# Patient Record
Sex: Male | Born: 1955 | Race: White | Hispanic: No | Smoking: Former smoker
Health system: Southern US, Community
[De-identification: ages and names within clinical notes are randomized; demographics above are authoritative.]

## PROBLEM LIST (undated history)

## (undated) DIAGNOSIS — I2510 Atherosclerotic heart disease of native coronary artery without angina pectoris: Secondary | ICD-10-CM

## (undated) DIAGNOSIS — J449 Chronic obstructive pulmonary disease, unspecified: Secondary | ICD-10-CM

## (undated) DIAGNOSIS — E559 Vitamin D deficiency, unspecified: Secondary | ICD-10-CM

## (undated) DIAGNOSIS — M1990 Unspecified osteoarthritis, unspecified site: Secondary | ICD-10-CM

## (undated) DIAGNOSIS — M109 Gout, unspecified: Secondary | ICD-10-CM

## (undated) DIAGNOSIS — D649 Anemia, unspecified: Secondary | ICD-10-CM

## (undated) DIAGNOSIS — I10 Essential (primary) hypertension: Secondary | ICD-10-CM

## (undated) DIAGNOSIS — G4733 Obstructive sleep apnea (adult) (pediatric): Secondary | ICD-10-CM

## (undated) DIAGNOSIS — G4700 Insomnia, unspecified: Secondary | ICD-10-CM

## (undated) DIAGNOSIS — R0600 Dyspnea, unspecified: Secondary | ICD-10-CM

## (undated) DIAGNOSIS — Z950 Presence of cardiac pacemaker: Secondary | ICD-10-CM

## (undated) DIAGNOSIS — E119 Type 2 diabetes mellitus without complications: Secondary | ICD-10-CM

## (undated) DIAGNOSIS — E785 Hyperlipidemia, unspecified: Secondary | ICD-10-CM

## (undated) DIAGNOSIS — I6529 Occlusion and stenosis of unspecified carotid artery: Secondary | ICD-10-CM

## (undated) HISTORY — DX: Type 2 diabetes mellitus without complications: E11.9

## (undated) HISTORY — PX: PACEMAKER INSERTION: SHX728

## (undated) HISTORY — DX: Insomnia, unspecified: G47.00

## (undated) HISTORY — DX: Chronic obstructive pulmonary disease, unspecified: J44.9

## (undated) HISTORY — DX: Obstructive sleep apnea (adult) (pediatric): G47.33

## (undated) HISTORY — DX: Hyperlipidemia, unspecified: E78.5

## (undated) HISTORY — DX: Atherosclerotic heart disease of native coronary artery without angina pectoris: I25.10

## (undated) HISTORY — DX: Anemia, unspecified: D64.9

## (undated) HISTORY — DX: Vitamin D deficiency, unspecified: E55.9

## (undated) HISTORY — DX: Occlusion and stenosis of unspecified carotid artery: I65.29

---

## 1989-02-26 HISTORY — PX: BACK SURGERY: SHX140

## 1992-02-27 HISTORY — PX: CATARACT EXTRACTION: SUR2

## 2011-02-27 HISTORY — PX: CARDIAC CATHETERIZATION: SHX172

## 2011-02-27 HISTORY — PX: CORONARY ARTERY BYPASS GRAFT: SHX141

## 2015-09-22 ENCOUNTER — Encounter: Payer: Medicare HMO | Admitting: Vascular Surgery

## 2015-09-22 ENCOUNTER — Encounter (HOSPITAL_COMMUNITY): Payer: Medicare HMO

## 2015-10-04 ENCOUNTER — Other Ambulatory Visit: Payer: Self-pay | Admitting: Vascular Surgery

## 2015-10-04 DIAGNOSIS — I6521 Occlusion and stenosis of right carotid artery: Secondary | ICD-10-CM

## 2015-10-25 ENCOUNTER — Encounter: Payer: Self-pay | Admitting: Vascular Surgery

## 2015-11-04 ENCOUNTER — Ambulatory Visit: Payer: Medicare HMO | Admitting: *Deleted

## 2015-11-04 ENCOUNTER — Ambulatory Visit (INDEPENDENT_AMBULATORY_CARE_PROVIDER_SITE_OTHER): Payer: Medicare HMO | Admitting: Vascular Surgery

## 2015-11-04 ENCOUNTER — Ambulatory Visit (HOSPITAL_COMMUNITY)
Admission: RE | Admit: 2015-11-04 | Discharge: 2015-11-04 | Disposition: A | Discharge status: Home / Self Care | Payer: Medicare HMO | Source: Ambulatory Visit | Attending: Vascular Surgery | Admitting: Vascular Surgery

## 2015-11-04 ENCOUNTER — Encounter: Payer: Self-pay | Admitting: Vascular Surgery

## 2015-11-04 VITALS — BP 103/66 | HR 73 | Temp 98.0°F

## 2015-11-04 DIAGNOSIS — I739 Peripheral vascular disease, unspecified: Secondary | ICD-10-CM

## 2015-11-04 DIAGNOSIS — I6523 Occlusion and stenosis of bilateral carotid arteries: Secondary | ICD-10-CM

## 2015-11-04 DIAGNOSIS — I6521 Occlusion and stenosis of right carotid artery: Secondary | ICD-10-CM | POA: Insufficient documentation

## 2015-11-04 LAB — VAS US CAROTID
LCCADSYS: -89 cm/s
LCCAPDIAS: 22 cm/s
LEFT ECA DIAS: -10 cm/s
LICADDIAS: -35 cm/s
LICADSYS: -120 cm/s
LICAPDIAS: -46 cm/s
LICAPSYS: -128 cm/s
Left CCA dist dias: -22 cm/s
Left CCA prox sys: 101 cm/s
RCCAPDIAS: 20 cm/s
RIGHT CCA MID DIAS: 16 cm/s
RIGHT ECA DIAS: -12 cm/s
Right CCA prox sys: 99 cm/s
Right cca dist sys: -40 cm/s

## 2015-11-04 NOTE — Progress Notes (Signed)
Patient ID: NOE GLOD, male   DOB: 1955-03-24, 60 y.o.   MRN: [id number]  Reason for Consult: New Evaluation (bilateral carotid stenosis)   Referred by Secundino Ginger, PA-C  Subjective:     HPI:  Dean Ellison is a 60 y.o. male with history coronary artery disease status post CABG. He is a current every day smoker and was recently evaluated at [facility] for carotid artery disease where he was found to have greater than 80% disease. He denies any history of stroke TIA or amaurosis. He does have some pain in his hips and lower extremities with walking which appeared to be both musculoskeletal and possibly vascular. He also has erectile dysfunction as well as a diagnosis of CHF. He does take aspirin and statin regularly states that he can walk up to half a mile per day.  Past Medical History:  Diagnosis Date  . Anemia   . CAD (coronary artery disease)   . Carotid artery occlusion   . COPD (chronic obstructive pulmonary disease) ([place])   . Diabetes mellitus without complication ([place])   . Hyperlipidemia   . OSA (obstructive sleep apnea)   . Persistent disorder of initiating or maintaining sleep   . Vitamin D deficiency    Family History  Problem Relation Age of Onset  . Heart disease Mother   . Diabetes Mother   . Hypertension Mother   . Arthritis Mother   . Heart disease Father   . Diabetes Father   . Hypertension Father   . Arthritis Father    Past Surgical History:  Procedure Laterality Date  . BACK SURGERY  1991  . CARDIAC CATHETERIZATION Right 2013  . CATARACT EXTRACTION Bilateral 1994  . CORONARY ARTERY BYPASS GRAFT  2013  . PACEMAKER INSERTION      Short Social History:  Social History  Substance Use Topics  . Smoking status: Current Every Day Smoker    Years: 40.00    Types: Cigarettes  . Smokeless tobacco: Never Used  . Alcohol use 0.6 oz/week    1 Cans of beer per week     Comment: 1 every 6 months or more    Allergies  Allergen  Reactions  . Meperidine Other (See Comments)    crazy Makes me crazy crazy Pt stated "it made me crazy"    Current Outpatient Prescriptions  Medication Sig Dispense Refill  . aspirin buffered ([employer] BUFFERED ASPIRIN) 325 MG TABS tablet Take 325 mg by mouth.    Marland Kitchen atenolol (TENORMIN) 50 MG tablet Take 50 mg by mouth.    Marland Kitchen atenolol (TENORMIN) 50 MG tablet Take by mouth.    . Dulaglutide 1.5 MG/0.5ML SOPN Inject 1.5 mg into the skin.    . famotidine (PEPCID) 20 MG tablet Take 20 mg by mouth.    . furosemide (LASIX) 20 MG tablet Take 20 mg by mouth.    Marland Kitchen lisinopril (PRINIVIL,ZESTRIL) 20 MG tablet Take 20 mg by mouth.    . metFORMIN (GLUCOPHAGE) 1000 MG tablet Take 1,000 mg by mouth.    . rosuvastatin (CRESTOR) 20 MG tablet Take 20 mg by mouth.    . tiotropium (SPIRIVA) 18 MCG inhalation capsule Place into inhaler and inhale.     No current facility-administered medications for this visit.     Review of Systems  Constitutional:  Constitutional negative. HENT: HENT negative.  Eyes: Eyes negative.  Respiratory: Positive for shortness of breath.  GI: Gastrointestinal negative.  GU:  Erectile dysfunction Musculoskeletal: Positive for leg pain and joint pain.  Skin: Negative for wound.  Neurological: Neurological negative. Hematologic: Hematologic/lymphatic negative.  Psychiatric: Psychiatric negative.        Objective:  Objective   Vitals:   11/04/15 1158 11/04/15 1201  BP: 119/71 103/66  Pulse: 73 73  Temp: 98 F (36.7 C)    There is no height or weight on file to calculate BMI.  Physical Exam  Constitutional: He is oriented to person, place, and time. He appears well-developed.  HENT:  Head: Normocephalic.  Eyes: EOM are normal.  Neck: Normal range of motion.  Cardiovascular: Normal rate and normal heart sounds.   Pulses:      Carotid pulses are 2+ on the right side, and 2+ on the left side.      Radial pulses are 2+ on the right side, and 2+ on the left side.         Femoral pulses are 2+ on the right side, and 2+ on the left side. Multiphasic R peroneal/pt, no dp obtained L pt/at multiphasic  Pulmonary/Chest: Effort normal.  Abdominal: Soft. He exhibits no mass.  Musculoskeletal: Normal range of motion. He exhibits no edema.  Lymphadenopathy:    He has no cervical adenopathy.  Neurological: He is alert and oriented to person, place, and time. No cranial nerve deficit.  Skin: Skin is warm and dry.    Data: Carotid duplex today demonstrating right-sided 60-79% proximal ICA stenosis with PSV 273 and EDV 100.     Assessment/Plan:   this is a 60 year old white male with history of CAD sent here for carotid artery stenosis which is 60-79% by our study today. He is on aspirin and statin which he takes religiously. He also has some limitation to his walking which appears multifactorial. He is to continue his medicines and I will follow him up in 1 year with repeat carotid duplex as well as ABIs. I have counseled him on smoking cessation and his likelihood of progressing to need carotid intervention should he did not stop smoking. I have also counseled him on the warning signs of stroke for which she should seek medical attention and he demonstrates good understanding.    Waynetta Sandy MD [facility]

## 2015-12-01 ENCOUNTER — Ambulatory Visit: Payer: Medicare HMO | Admitting: Skilled Nursing Facility1

## 2016-01-31 NOTE — Addendum Note (Signed)
Addended by: Lianne Cure A on: 01/31/2016 10:02 AM   Modules accepted: Orders

## 2016-11-09 ENCOUNTER — Ambulatory Visit: Payer: Medicare HMO | Admitting: Vascular Surgery

## 2016-11-09 ENCOUNTER — Encounter (HOSPITAL_COMMUNITY): Payer: Medicare HMO

## 2017-01-04 ENCOUNTER — Encounter (HOSPITAL_COMMUNITY): Payer: Medicare HMO

## 2017-01-04 ENCOUNTER — Ambulatory Visit: Payer: Medicare HMO | Admitting: Vascular Surgery

## 2017-02-15 ENCOUNTER — Ambulatory Visit: Payer: Medicare HMO | Admitting: Vascular Surgery

## 2017-02-15 ENCOUNTER — Encounter (HOSPITAL_COMMUNITY): Payer: Medicare HMO

## 2017-04-05 ENCOUNTER — Encounter (HOSPITAL_COMMUNITY): Payer: Medicare HMO

## 2017-04-05 ENCOUNTER — Ambulatory Visit: Payer: Medicare HMO | Admitting: Vascular Surgery

## 2017-05-31 ENCOUNTER — Encounter: Payer: Self-pay | Admitting: Vascular Surgery

## 2017-05-31 ENCOUNTER — Other Ambulatory Visit: Payer: Self-pay

## 2017-05-31 ENCOUNTER — Ambulatory Visit (HOSPITAL_COMMUNITY)
Admission: RE | Admit: 2017-05-31 | Discharge: 2017-05-31 | Disposition: A | Discharge status: Home / Self Care | Payer: Medicare HMO | Source: Ambulatory Visit | Attending: Vascular Surgery | Admitting: Vascular Surgery

## 2017-05-31 ENCOUNTER — Ambulatory Visit (INDEPENDENT_AMBULATORY_CARE_PROVIDER_SITE_OTHER)
Admission: RE | Admit: 2017-05-31 | Discharge: 2017-05-31 | Disposition: A | Discharge status: Home / Self Care | Payer: Medicare HMO | Source: Ambulatory Visit | Attending: Vascular Surgery | Admitting: Vascular Surgery

## 2017-05-31 ENCOUNTER — Ambulatory Visit (INDEPENDENT_AMBULATORY_CARE_PROVIDER_SITE_OTHER): Payer: Medicare HMO | Admitting: Vascular Surgery

## 2017-05-31 VITALS — BP 127/71 | HR 80 | Resp 20 | Ht <= 58 in | Wt 182.0 lb

## 2017-05-31 DIAGNOSIS — E785 Hyperlipidemia, unspecified: Secondary | ICD-10-CM | POA: Insufficient documentation

## 2017-05-31 DIAGNOSIS — I10 Essential (primary) hypertension: Secondary | ICD-10-CM | POA: Insufficient documentation

## 2017-05-31 DIAGNOSIS — E1151 Type 2 diabetes mellitus with diabetic peripheral angiopathy without gangrene: Secondary | ICD-10-CM | POA: Diagnosis not present

## 2017-05-31 DIAGNOSIS — I739 Peripheral vascular disease, unspecified: Secondary | ICD-10-CM

## 2017-05-31 DIAGNOSIS — I6523 Occlusion and stenosis of bilateral carotid arteries: Secondary | ICD-10-CM

## 2017-05-31 DIAGNOSIS — Z01812 Encounter for preprocedural laboratory examination: Secondary | ICD-10-CM

## 2017-05-31 DIAGNOSIS — F17200 Nicotine dependence, unspecified, uncomplicated: Secondary | ICD-10-CM | POA: Diagnosis not present

## 2017-05-31 NOTE — Progress Notes (Signed)
Patient ID: Dean Ellison, male   DOB: Feb 12, 1956, 62 y.o.   MRN: [id number]  Reason for Consult: Follow-up (1 year f/u carotid, abi )   Referred by Secundino Ginger, PA-C  Subjective:     HPI:  Dean Ellison is a 62 y.o. male whom I first saw last year for carotid artery disease that was thought to be greater than 80% on the right.  He does not have any history of stroke TIA or amaurosis.  He now follows up with further carotid duplex.  He is taking his aspirin and statin regularly and continues to walk with out limitation.  Past Medical History:  Diagnosis Date  . Anemia   . CAD (coronary artery disease)   . Carotid artery occlusion   . COPD (chronic obstructive pulmonary disease) ([place])   . Diabetes mellitus without complication ([place])   . Hyperlipidemia   . OSA (obstructive sleep apnea)   . Persistent disorder of initiating or maintaining sleep   . Vitamin D deficiency    Family History  Problem Relation Age of Onset  . Heart disease Mother   . Diabetes Mother   . Hypertension Mother   . Arthritis Mother   . Heart disease Father   . Diabetes Father   . Hypertension Father   . Arthritis Father    Past Surgical History:  Procedure Laterality Date  . BACK SURGERY  1991  . CARDIAC CATHETERIZATION Right 2013  . CATARACT EXTRACTION Bilateral 1994  . CORONARY ARTERY BYPASS GRAFT  2013  . PACEMAKER INSERTION      Short Social History:  Social History   Tobacco Use  . Smoking status: Current Every Day Smoker    Years: 40.00    Types: Cigarettes  . Smokeless tobacco: Never Used  . Tobacco comment: taking chantix  Substance Use Topics  . Alcohol use: Yes    Alcohol/week: 0.6 oz    Types: 1 Cans of beer per week    Comment: 1 every 6 months or more    Allergies  Allergen Reactions  . Meperidine Other (See Comments)    crazy Makes me crazy crazy Pt stated "it made me crazy"    Current Outpatient Medications  Medication Sig Dispense Refill  . aspirin  buffered ([employer] BUFFERED ASPIRIN) 325 MG TABS tablet Take 81 mg by mouth.     Marland Kitchen atenolol (TENORMIN) 50 MG tablet Take 50 mg by mouth.    Marland Kitchen atenolol (TENORMIN) 50 MG tablet Take by mouth.    . clopidogrel (PLAVIX) 75 MG tablet Take 75 mg by mouth daily.    . Dulaglutide 1.5 MG/0.5ML SOPN Inject 1.5 mg into the skin.    . famotidine (PEPCID) 20 MG tablet Take 20 mg by mouth.    . Febuxostat (ULORIC PO) Take by mouth.    . furosemide (LASIX) 20 MG tablet Take 20 mg by mouth.    Marland Kitchen lisinopril (PRINIVIL,ZESTRIL) 20 MG tablet Take 20 mg by mouth.    . metFORMIN (GLUCOPHAGE) 1000 MG tablet Take 1,000 mg by mouth.    . rosuvastatin (CRESTOR) 20 MG tablet Take 20 mg by mouth.    Marland Kitchen SITagliptin Phosphate (JANUVIA PO) Take by mouth.    . tiotropium (SPIRIVA) 18 MCG inhalation capsule Place into inhaler and inhale.    . Varenicline Tartrate (CHANTIX PO) Take by mouth.    . Vitamin D, Ergocalciferol, 2000 units CAPS Take by mouth.     No current facility-administered medications for this  visit.     Review of Systems  Constitutional:  Constitutional negative. HENT: HENT negative.  Eyes: Eyes negative.  Respiratory: Respiratory negative.  Cardiovascular: Cardiovascular negative.  Musculoskeletal: Positive for joint pain.  Skin: Skin negative.  Neurological: Positive for focal weakness.  Psychiatric: Psychiatric negative.        Objective:  Objective   Vitals:   05/31/17 1340  BP: 127/71  Pulse: 80  Resp: 20  SpO2: 99%  Weight: 182 lb (82.6 kg)  Height: 1' (0.305 m)  HC: 70" (177.8 cm)   Body mass index is 888.61 kg/m.  Physical Exam  Constitutional: He is oriented to person, place, and time. He appears well-developed.  HENT:  Head: Normocephalic.  Eyes: Pupils are equal, round, and reactive to light.  Neck: Normal range of motion. Neck supple.  Cardiovascular: Normal rate.  Pulses:      Radial pulses are 2+ on the right side, and 2+ on the left side.       Popliteal pulses are  2+ on the right side, and 2+ on the left side.       Dorsalis pedis pulses are 2+ on the right side.       Posterior tibial pulses are 2+ on the right side, and 2+ on the left side.  Abdominal: Soft. He exhibits no mass.  Musculoskeletal: Normal range of motion. He exhibits no edema.  Neurological: He is alert and oriented to person, place, and time.  Skin: Skin is warm and dry.  Psychiatric: He has a normal mood and affect. His behavior is normal. Judgment and thought content normal.    Data: I reviewed his carotid artery duplex which demonstrates an 80-99% right carotid stenosis with a high bifurcation.  The left side has a 60-79% stenosis also with a high bifurcation.     Assessment/Plan:     62 year old male with 80-99% asymptomatic carotid artery stenosis on the right.  We discussed the risk of stroke in the need to stop smoking which she is working on to curb our risk factors and to take aspirin and statin his medical management.  We have also discussed the possibilities of continued medical management versus carotid endarterectomy versus stenting.  Given that he does have a high bifurcation by duplex we will get a CT Angio of his head and neck to evaluate and he will follow-up with family members to discuss our options moving forward.  Should he have any neurologic symptoms in the interim he needs to seek emergent medical care.  He demonstrates very good understanding of this today and we will get him set up for the above studies and short interval follow-up.     Waynetta Sandy MD [facility]

## 2017-06-19 ENCOUNTER — Telehealth: Payer: Self-pay | Admitting: Vascular Surgery

## 2017-06-19 NOTE — Telephone Encounter (Signed)
LVM for pt about his CT   [facility] can take a co payment of $25 and he can make payments for the rest

## 2017-06-21 ENCOUNTER — Encounter: Payer: Self-pay | Admitting: Vascular Surgery

## 2017-06-21 ENCOUNTER — Ambulatory Visit
Admission: RE | Admit: 2017-06-21 | Discharge: 2017-06-21 | Disposition: A | Discharge status: Home / Self Care | Payer: Medicare HMO | Source: Ambulatory Visit | Attending: Vascular Surgery | Admitting: Vascular Surgery

## 2017-06-21 ENCOUNTER — Other Ambulatory Visit: Payer: Self-pay

## 2017-06-21 ENCOUNTER — Ambulatory Visit (INDEPENDENT_AMBULATORY_CARE_PROVIDER_SITE_OTHER): Payer: Medicare HMO | Admitting: Vascular Surgery

## 2017-06-21 VITALS — BP 132/81 | HR 81 | Temp 98.8°F | Resp 20 | Ht 70.0 in | Wt 179.0 lb

## 2017-06-21 DIAGNOSIS — I6523 Occlusion and stenosis of bilateral carotid arteries: Secondary | ICD-10-CM | POA: Diagnosis not present

## 2017-06-21 DIAGNOSIS — Z01812 Encounter for preprocedural laboratory examination: Secondary | ICD-10-CM

## 2017-06-21 MED ORDER — IOPAMIDOL (ISOVUE-370) INJECTION 76%
75.0000 mL | Freq: Once | INTRAVENOUS | Status: AC | PRN
  Administered 2017-06-21: 75 mL via INTRAVENOUS

## 2017-06-21 NOTE — H&P (View-Only) (Signed)
Patient ID: Dean Ellison, male   DOB: 07-12-1955, 62 y.o.   MRN: [id number]  Reason for Consult: Carotid (2-3 wk f/u with CT prior and Cardiac clearance.)   Referred by Secundino Ginger, PA-C  Subjective:     HPI:  Dean Ellison is a 62 y.o. male follows up with CT scan to evaluate his bilateral carotid artery disease.  At last demonstration we had high-grade stenosis of his right internal carotid artery with moderate stenosis of the left.  He has not had any stroke TIA or amaurosis.  He is on aspirin Plavix and statin drug.  He has been transitioned to Xarelto for sick sinus syndrome but has not seen his cardiologist about this.  He is also being placed on amiodarone at same time.  Past Medical History:  Diagnosis Date  . Anemia   . CAD (coronary artery disease)   . Carotid artery occlusion   . COPD (chronic obstructive pulmonary disease) ([place])   . Diabetes mellitus without complication ([place])   . Hyperlipidemia   . OSA (obstructive sleep apnea)   . Persistent disorder of initiating or maintaining sleep   . Vitamin D deficiency    Family History  Problem Relation Age of Onset  . Heart disease Mother   . Diabetes Mother   . Hypertension Mother   . Arthritis Mother   . Heart disease Father   . Diabetes Father   . Hypertension Father   . Arthritis Father    Past Surgical History:  Procedure Laterality Date  . BACK SURGERY  1991  . CARDIAC CATHETERIZATION Right 2013  . CATARACT EXTRACTION Bilateral 1994  . CORONARY ARTERY BYPASS GRAFT  2013  . PACEMAKER INSERTION      Short Social History:  Social History   Tobacco Use  . Smoking status: Current Every Day Smoker    Years: 40.00    Types: Cigarettes  . Smokeless tobacco: Never Used  . Tobacco comment: Taking Chantix.  Only smokes 2-3 cigarettes per day.  Substance Use Topics  . Alcohol use: Yes    Alcohol/week: 0.6 oz    Types: 1 Cans of beer per week    Comment: 1 every 6 months or more    Allergies    Allergen Reactions  . Meperidine Other (See Comments)    crazy Makes me crazy crazy Pt stated "it made me crazy"    Current Outpatient Medications  Medication Sig Dispense Refill  . aspirin buffered ([employer] BUFFERED ASPIRIN) 325 MG TABS tablet Take 81 mg by mouth.     Marland Kitchen atenolol (TENORMIN) 50 MG tablet Take 50 mg by mouth.    . clopidogrel (PLAVIX) 75 MG tablet Take 75 mg by mouth daily.    . Dulaglutide 1.5 MG/0.5ML SOPN Inject 1.5 mg into the skin.    . famotidine (PEPCID) 20 MG tablet Take 20 mg by mouth.    . Febuxostat (ULORIC PO) Take by mouth.    . furosemide (LASIX) 20 MG tablet Take 20 mg by mouth.    Marland Kitchen lisinopril (PRINIVIL,ZESTRIL) 20 MG tablet Take 20 mg by mouth.    . metFORMIN (GLUCOPHAGE) 1000 MG tablet Take 1,000 mg by mouth.    . rosuvastatin (CRESTOR) 20 MG tablet Take 20 mg by mouth.    Marland Kitchen SITagliptin Phosphate (JANUVIA PO) Take by mouth.    . tiotropium (SPIRIVA) 18 MCG inhalation capsule Place into inhaler and inhale.    . Varenicline Tartrate (CHANTIX PO) Take by mouth.    Marland Kitchen  Vitamin D, Ergocalciferol, 2000 units CAPS Take by mouth.     No current facility-administered medications for this visit.     Review of Systems  Constitutional:  Constitutional negative. HENT: HENT negative.  Eyes: Eyes negative.  Respiratory: Positive for shortness of breath.  Cardiovascular: Positive for chest pain.  Musculoskeletal: Musculoskeletal negative.  Skin: Skin negative.  Neurological: Neurological negative. Hematologic: Hematologic/lymphatic negative.  Psychiatric: Psychiatric negative.        Objective:  Objective   Vitals:   06/21/17 1121 06/21/17 1123  BP: 101/63 132/81  Pulse: 81   Resp: 20   Temp: 98.8 F (37.1 C)   TempSrc: Oral   SpO2: 97%   Weight: 179 lb (81.2 kg)   Height: 5\' 10"  (1.778 m)    Body mass index is 25.68 kg/m.  Physical Exam  Constitutional: He is oriented to person, place, and time. He appears well-developed.  HENT:  Head:  Normocephalic.  Eyes: Pupils are equal, round, and reactive to light.  Neck: Normal range of motion. Neck supple.  Cardiovascular: Normal rate.  Pulses:      Radial pulses are 2+ on the right side, and 2+ on the left side.  Pulmonary/Chest: Effort normal.  Abdominal: Soft. He exhibits no mass.  Musculoskeletal: Normal range of motion. He exhibits no edema.  Neurological: He is alert and oriented to person, place, and time. No cranial nerve deficit.  Skin: Skin is warm and dry.  Psychiatric: He has a normal mood and affect. His behavior is normal. Judgment and thought content normal.    Data: IMPRESSION: 1. Age advanced atherosclerosis resulting in 70% right and 40% left proximal ICA stenoses. 2. 50% proximal left common carotid artery stenosis. 3. Moderate right vertebral artery origin stenosis. 4. Intracranial atherosclerosis without proximal branch occlusion or flow limiting proximal stenosis. 5. Mild chronic small vessel ischemic disease in the cerebral white matter with chronic right basal ganglia lacunar infarct. 6. Aortic Atherosclerosis (ICD10-I70.0) and Emphysema (ICD10-J43.9).   Electronically Signed   By: Logan Bores M.D.   On: 06/21/2017 11:15  I reviewed the CAT scan with the patient and his son and demonstrated his bilateral carotid lesions on the right side which by NASCET criteria is a proximally 70% but by direct measurement likely closer to 80 to 90%   Which is consistent with his duplex.        Assessment/Plan:     62 year old male here for evaluation bilateral carotid artery stenosis with high-grade stenosis on the right.  CT scan today demonstrates high lesion which is surgically difficult given the size of his neck and the fact that it extends up to near the top of C2.  He is on aspirin Plavix and a statin drug.  I discussed with him the options being no intervention versus carotid endarterectomy versus carotid stenting.  After reviewing his CT scan with  him he is leaning towards carotid stenting but with his sick sinus syndrome and being transitioned to Xarelto he is unwilling to proceed with surgery at this time.  He wants to talk with his cardiologist first and get his medication situated and will follow-up in 4 to 6 weeks to discuss surgery again.  Should he have any symptoms of stroke TIA or amaurosis which I discussed with him today he needs to be seen urgently and will need consideration of more expedient surgery.  He demonstrates good understanding in the presence of his son who also seems very knowledgeable.  He does not need further  studies prior to this next follow-up and does appear to be a candidate for TCAR should he elect stenting.     Waynetta Sandy MD [facility]

## 2017-06-21 NOTE — Progress Notes (Signed)
Patient ID: Dean Ellison, male   DOB: Mar 08, 1955, 62 y.o.   MRN: [id number]  Reason for Consult: Carotid (2-3 wk f/u with CT prior and Cardiac clearance.)   Referred by Secundino Ginger, PA-C  Subjective:     HPI:  Dean Ellison is a 62 y.o. male follows up with CT scan to evaluate his bilateral carotid artery disease.  At last demonstration we had high-grade stenosis of his right internal carotid artery with moderate stenosis of the left.  He has not had any stroke TIA or amaurosis.  He is on aspirin Plavix and statin drug.  He has been transitioned to Xarelto for sick sinus syndrome but has not seen his cardiologist about this.  He is also being placed on amiodarone at same time.  Past Medical History:  Diagnosis Date  . Anemia   . CAD (coronary artery disease)   . Carotid artery occlusion   . COPD (chronic obstructive pulmonary disease) ([place])   . Diabetes mellitus without complication ([place])   . Hyperlipidemia   . OSA (obstructive sleep apnea)   . Persistent disorder of initiating or maintaining sleep   . Vitamin D deficiency    Family History  Problem Relation Age of Onset  . Heart disease Mother   . Diabetes Mother   . Hypertension Mother   . Arthritis Mother   . Heart disease Father   . Diabetes Father   . Hypertension Father   . Arthritis Father    Past Surgical History:  Procedure Laterality Date  . BACK SURGERY  1991  . CARDIAC CATHETERIZATION Right 2013  . CATARACT EXTRACTION Bilateral 1994  . CORONARY ARTERY BYPASS GRAFT  2013  . PACEMAKER INSERTION      Short Social History:  Social History   Tobacco Use  . Smoking status: Current Every Day Smoker    Years: 40.00    Types: Cigarettes  . Smokeless tobacco: Never Used  . Tobacco comment: Taking Chantix.  Only smokes 2-3 cigarettes per day.  Substance Use Topics  . Alcohol use: Yes    Alcohol/week: 0.6 oz    Types: 1 Cans of beer per week    Comment: 1 every 6 months or more    Allergies    Allergen Reactions  . Meperidine Other (See Comments)    crazy Makes me crazy crazy Pt stated "it made me crazy"    Current Outpatient Medications  Medication Sig Dispense Refill  . aspirin buffered ([facility] BUFFERED ASPIRIN) 325 MG TABS tablet Take 81 mg by mouth.     Marland Kitchen atenolol (TENORMIN) 50 MG tablet Take 50 mg by mouth.    . clopidogrel (PLAVIX) 75 MG tablet Take 75 mg by mouth daily.    . Dulaglutide 1.5 MG/0.5ML SOPN Inject 1.5 mg into the skin.    . famotidine (PEPCID) 20 MG tablet Take 20 mg by mouth.    . Febuxostat (ULORIC PO) Take by mouth.    . furosemide (LASIX) 20 MG tablet Take 20 mg by mouth.    Marland Kitchen lisinopril (PRINIVIL,ZESTRIL) 20 MG tablet Take 20 mg by mouth.    . metFORMIN (GLUCOPHAGE) 1000 MG tablet Take 1,000 mg by mouth.    . rosuvastatin (CRESTOR) 20 MG tablet Take 20 mg by mouth.    Marland Kitchen SITagliptin Phosphate (JANUVIA PO) Take by mouth.    . tiotropium (SPIRIVA) 18 MCG inhalation capsule Place into inhaler and inhale.    . Varenicline Tartrate (CHANTIX PO) Take by mouth.    Marland Kitchen  Vitamin D, Ergocalciferol, 2000 units CAPS Take by mouth.     No current facility-administered medications for this visit.     Review of Systems  Constitutional:  Constitutional negative. HENT: HENT negative.  Eyes: Eyes negative.  Respiratory: Positive for shortness of breath.  Cardiovascular: Positive for chest pain.  Musculoskeletal: Musculoskeletal negative.  Skin: Skin negative.  Neurological: Neurological negative. Hematologic: Hematologic/lymphatic negative.  Psychiatric: Psychiatric negative.        Objective:  Objective   Vitals:   06/21/17 1121 06/21/17 1123  BP: 101/63 132/81  Pulse: 81   Resp: 20   Temp: 98.8 F (37.1 C)   TempSrc: Oral   SpO2: 97%   Weight: 179 lb (81.2 kg)   Height: 5\' 10"  (1.778 m)    Body mass index is 25.68 kg/m.  Physical Exam  Constitutional: He is oriented to person, place, and time. He appears well-developed.  HENT:  Head:  Normocephalic.  Eyes: Pupils are equal, round, and reactive to light.  Neck: Normal range of motion. Neck supple.  Cardiovascular: Normal rate.  Pulses:      Radial pulses are 2+ on the right side, and 2+ on the left side.  Pulmonary/Chest: Effort normal.  Abdominal: Soft. He exhibits no mass.  Musculoskeletal: Normal range of motion. He exhibits no edema.  Neurological: He is alert and oriented to person, place, and time. No cranial nerve deficit.  Skin: Skin is warm and dry.  Psychiatric: He has a normal mood and affect. His behavior is normal. Judgment and thought content normal.    Data: IMPRESSION: 1. Age advanced atherosclerosis resulting in 70% right and 40% left proximal ICA stenoses. 2. 50% proximal left common carotid artery stenosis. 3. Moderate right vertebral artery origin stenosis. 4. Intracranial atherosclerosis without proximal branch occlusion or flow limiting proximal stenosis. 5. Mild chronic small vessel ischemic disease in the cerebral white matter with chronic right basal ganglia lacunar infarct. 6. Aortic Atherosclerosis (ICD10-I70.0) and Emphysema (ICD10-J43.9).   Electronically Signed   By: Logan Bores M.D.   On: 06/21/2017 11:15  I reviewed the CAT scan with the patient and his son and demonstrated his bilateral carotid lesions on the right side which by NASCET criteria is a proximally 70% but by direct measurement likely closer to 80 to 90%   Which is consistent with his duplex.        Assessment/Plan:     62 year old male here for evaluation bilateral carotid artery stenosis with high-grade stenosis on the right.  CT scan today demonstrates high lesion which is surgically difficult given the size of his neck and the fact that it extends up to near the top of C2.  He is on aspirin Plavix and a statin drug.  I discussed with him the options being no intervention versus carotid endarterectomy versus carotid stenting.  After reviewing his CT scan with  him he is leaning towards carotid stenting but with his sick sinus syndrome and being transitioned to Xarelto he is unwilling to proceed with surgery at this time.  He wants to talk with his cardiologist first and get his medication situated and will follow-up in 4 to 6 weeks to discuss surgery again.  Should he have any symptoms of stroke TIA or amaurosis which I discussed with him today he needs to be seen urgently and will need consideration of more expedient surgery.  He demonstrates good understanding in the presence of his son who also seems very knowledgeable.  He does not need further  studies prior to this next follow-up and does appear to be a candidate for TCAR should he elect stenting.     Waynetta Sandy MD [facility]

## 2017-07-08 ENCOUNTER — Other Ambulatory Visit: Payer: Self-pay | Admitting: *Deleted

## 2017-07-15 NOTE — Pre-Procedure Instructions (Signed)
Dean Ellison  07/15/2017      [address] [place] [address] Phone: [phone] Fax: [phone]  [facility] - [place], [place] [address] Phone: [phone] Fax: [phone]    Your procedure is scheduled on May 23  Report to [facility] Admitting at 5:30 A.M.  Call this number if you have problems the morning of surgery:  [phone]   Remember:  No food or liquids after midnight.                        Take these medicines the morning of surgery with A SIP OF WATER : amiodarone (pacerone), atenolol (tenormin), sprirva,  7 days prior to surgery STOP taking Aleve, Naproxen, Ibuprofen, Motrin, Advil, Goody's, BC's, all herbal medications, fish oil, and all vitamins                                 How to Manage Your Diabetes Before and After Surgery  Why is it important to control my blood sugar before and after surgery? . Improving blood sugar levels before and after surgery helps healing and can limit problems. . A way of improving blood sugar control is eating a healthy diet by: o  Eating less sugar and carbohydrates o  Increasing activity/exercise o  Talking with your doctor about reaching your blood sugar goals . High blood sugars (greater than 180 mg/dL) can raise your risk of infections and slow your recovery, so you will need to focus on controlling your diabetes during the weeks before surgery. . Make sure that the doctor who takes care of your diabetes knows about your planned surgery including the date and location.  How do I manage my blood sugar before surgery? . Check your blood sugar at least 4 times a day, starting 2 days before surgery, to make sure that the level is not too high or low. o Check your blood sugar the morning of your surgery when you wake up and every 2 hours until you get to the Short  Stay unit. . If your blood sugar is less than 70 mg/dL, you will need to treat for low blood sugar: o Do not take insulin. o Treat a low blood sugar (less than 70 mg/dL) with  cup of clear juice (cranberry or apple), 4 glucose tablets, OR glucose gel. Recheck blood sugar in 15 minutes after treatment (to make sure it is greater than 70 mg/dL). If your blood sugar is not greater than 70 mg/dL on recheck, call [phone] o  for further instructions. . Report your blood sugar to the short stay nurse when you get to Short Stay.  . If you are admitted to the hospital after surgery: o Your blood sugar will be checked by the staff and you will probably be given insulin after surgery (instead of oral diabetes medicines) to make sure you have good blood sugar levels. o The goal for blood sugar control after surgery is 80-180 mg/dL.      WHAT DO I DO ABOUT MY DIABETES MEDICATION?   Marland Kitchen Do not take oral diabetes medicines (pills) the morning of surgery.       Do not wear jewelry.  Do not wear lotions, powders, or perfumes, or deodorant.  Do  not shave 48 hours prior to surgery.  Men may shave face and neck.  Do not bring valuables to the hospital.  [facility] is not responsible for any belongings or valuables.  Contacts, dentures or bridgework may not be worn into surgery.  Leave your suitcase in the car.  After surgery it may be brought to your room.  For patients admitted to the hospital, discharge time will be determined by your treatment team.  Patients discharged the day of surgery will not be allowed to drive home.    Special instructions:  [place]- Preparing For Surgery  Before surgery, you can play an important role. Because skin is not sterile, your skin needs to be as free of germs as possible. You can reduce the number of germs on your skin by washing with CHG (chlorahexidine gluconate) Soap before surgery.  CHG is an antiseptic cleaner which kills germs and bonds with the  skin to continue killing germs even after washing.    Oral Hygiene is also important to reduce your risk of infection.  Remember - BRUSH YOUR TEETH THE MORNING OF SURGERY WITH YOUR REGULAR TOOTHPASTE  Please do not use if you have an allergy to CHG or antibacterial soaps. If your skin becomes reddened/irritated stop using the CHG.  Do not shave (including legs and underarms) for at least 48 hours prior to first CHG shower. It is OK to shave your face.  Please follow these instructions carefully.   1. Shower the NIGHT BEFORE SURGERY and the MORNING OF SURGERY with CHG.   2. If you chose to wash your hair, wash your hair first as usual with your normal shampoo.  3. After you shampoo, rinse your hair and body thoroughly to remove the shampoo.  4. Use CHG as you would any other liquid soap. You can apply CHG directly to the skin and wash gently with a scrungie or a clean washcloth.   5. Apply the CHG Soap to your body ONLY FROM THE NECK DOWN.  Do not use on open wounds or open sores. Avoid contact with your eyes, ears, mouth and genitals (private parts). Wash Face and genitals (private parts)  with your normal soap.  6. Wash thoroughly, paying special attention to the area where your surgery will be performed.  7. Thoroughly rinse your body with warm water from the neck down.  8. DO NOT shower/wash with your normal soap after using and rinsing off the CHG Soap.  9. Pat yourself dry with a CLEAN TOWEL.  10. Wear CLEAN PAJAMAS to bed the night before surgery, wear comfortable clothes the morning of surgery  11. Place CLEAN SHEETS on your bed the night of your first shower and DO NOT SLEEP WITH PETS.    Day of Surgery:  Do not apply any deodorants/lotions.  Please wear clean clothes to the hospital/surgery center.   Remember to brush your teeth WITH YOUR REGULAR TOOTHPASTE.    Please read over the following fact sheets that you were given. Coughing and Deep Breathing, MRSA  Information and Surgical Site Infection Prevention

## 2017-07-16 ENCOUNTER — Encounter (HOSPITAL_COMMUNITY): Payer: Self-pay

## 2017-07-16 ENCOUNTER — Encounter (HOSPITAL_COMMUNITY)
Admission: RE | Admit: 2017-07-16 | Discharge: 2017-07-16 | Disposition: A | Discharge status: Home / Self Care | Payer: Medicare HMO | Source: Ambulatory Visit | Attending: Vascular Surgery | Admitting: Vascular Surgery

## 2017-07-16 HISTORY — DX: Presence of cardiac pacemaker: Z95.0

## 2017-07-16 HISTORY — DX: Gout, unspecified: M10.9

## 2017-07-16 HISTORY — DX: Essential (primary) hypertension: I10

## 2017-07-16 HISTORY — DX: Unspecified osteoarthritis, unspecified site: M19.90

## 2017-07-16 HISTORY — DX: Dyspnea, unspecified: R06.00

## 2017-07-16 LAB — HEMOGLOBIN A1C
Hgb A1c MFr Bld: 8.1 % — ABNORMAL HIGH (ref 4.8–5.6)
MEAN PLASMA GLUCOSE: 185.77 mg/dL

## 2017-07-16 LAB — URINALYSIS, ROUTINE W REFLEX MICROSCOPIC
Bacteria, UA: NONE SEEN
Bilirubin Urine: NEGATIVE
GLUCOSE, UA: NEGATIVE mg/dL
HGB URINE DIPSTICK: NEGATIVE
KETONES UR: NEGATIVE mg/dL
LEUKOCYTES UA: NEGATIVE
NITRITE: NEGATIVE
Protein, ur: 30 mg/dL — AB
SPECIFIC GRAVITY, URINE: 1.01 (ref 1.005–1.030)
pH: 5 (ref 5.0–8.0)

## 2017-07-16 LAB — CBC
HCT: 37 % — ABNORMAL LOW (ref 39.0–52.0)
Hemoglobin: 12.1 g/dL — ABNORMAL LOW (ref 13.0–17.0)
MCH: 29.2 pg (ref 26.0–34.0)
MCHC: 32.7 g/dL (ref 30.0–36.0)
MCV: 89.4 fL (ref 78.0–100.0)
Platelets: 231 10*3/uL (ref 150–400)
RBC: 4.14 MIL/uL — ABNORMAL LOW (ref 4.22–5.81)
RDW: 13.1 % (ref 11.5–15.5)
WBC: 7.2 10*3/uL (ref 4.0–10.5)

## 2017-07-16 LAB — COMPREHENSIVE METABOLIC PANEL
ALT: 26 U/L (ref 17–63)
AST: 20 U/L (ref 15–41)
Albumin: 4 g/dL (ref 3.5–5.0)
Alkaline Phosphatase: 72 U/L (ref 38–126)
Anion gap: 13 (ref 5–15)
BUN: 19 mg/dL (ref 6–20)
CO2: 20 mmol/L — AB (ref 22–32)
CREATININE: 1.5 mg/dL — AB (ref 0.61–1.24)
Calcium: 9.2 mg/dL (ref 8.9–10.3)
Chloride: 104 mmol/L (ref 101–111)
GFR calc Af Amer: 56 mL/min — ABNORMAL LOW (ref >60)
GFR calc non Af Amer: 49 mL/min — ABNORMAL LOW (ref >60)
Glucose, Bld: 181 mg/dL — ABNORMAL HIGH (ref 65–99)
Potassium: 4.2 mmol/L (ref 3.5–5.1)
SODIUM: 137 mmol/L (ref 135–145)
Total Bilirubin: 0.4 mg/dL (ref 0.3–1.2)
Total Protein: 6.9 g/dL (ref 6.5–8.1)

## 2017-07-16 LAB — SURGICAL PCR SCREEN
MRSA, PCR: NEGATIVE
STAPHYLOCOCCUS AUREUS: NEGATIVE

## 2017-07-16 LAB — TYPE AND SCREEN
ABO/RH(D): O POS
Antibody Screen: NEGATIVE

## 2017-07-16 LAB — APTT: APTT: 30 s (ref 24–36)

## 2017-07-16 LAB — PROTIME-INR
INR: 0.96
Prothrombin Time: 12.6 seconds (ref 11.4–15.2)

## 2017-07-16 LAB — GLUCOSE, CAPILLARY: Glucose-Capillary: 183 mg/dL — ABNORMAL HIGH (ref 65–99)

## 2017-07-16 LAB — ABO/RH: ABO/RH(D): O POS

## 2017-07-16 NOTE — Progress Notes (Addendum)
[employer] pacemaker last in person device ck 03-14-2017 Care Everywhere  Cardiologist Dr. Barnett Hatter Dean Ellison  [facility]  Requested EKG tracing, ECHO, OV and any other cardiac studies available  Peri-operative Implanted Device orders faxed to Dr. Gerarda Ellison.  E-Mail sent to Dean Ellison and Dean Ellison.  E-Mail sent to Dean Kussmaul RN  PCP  Dean Cowman PA-C  Pt. Was told by Dr. Donzetta Matters to continue ASA and plavix  Pt. States that he did a remote check on his pacemaker 06-19-2017 and was told it was abnormal but was not contacted by cardiologist and given any further information

## 2017-07-16 NOTE — Pre-Procedure Instructions (Signed)
Dean Ellison  07/16/2017      [address], [place] [address] Phone: [phone] Fax: [phone]  [facility] Delivery - [address] [place], [place] [address] Phone: [phone] Fax: [phone]    Your procedure is scheduled on May 23  Report to [facility] at 5:30 A.M.  Call this number if you have problems the morning of surgery:  [phone]   Remember:  No food or liquids after midnight.                        Take these medicines the morning of surgery with A SIP OF WATER : amiodarone (pacerone), atenolol (tenormin), sprirva,Pepcid(famotidine),Uloric  7 days prior to surgery STOP taking Aleve, Naproxen, Ibuprofen, Motrin, Advil, Goody's, BC's, all herbal medications, fish oil, and all vitamins                                 How to Manage Your Diabetes Before and After Surgery  Why is it important to control my blood sugar before and after surgery? . Improving blood sugar levels before and after surgery helps healing and can limit problems. . A way of improving blood sugar control is eating a healthy diet by: o  Eating less sugar and carbohydrates o  Increasing activity/exercise o  Talking with your doctor about reaching your blood sugar goals . High blood sugars (greater than 180 mg/dL) can raise your risk of infections and slow your recovery, so you will need to focus on controlling your diabetes during the weeks before surgery. . Make sure that the doctor who takes care of your diabetes knows about your planned surgery including the date and location.  How do I manage my blood sugar before surgery? . Check your blood sugar at least 4 times a day, starting 2 days before surgery, to make sure that the level is not too high or low. o Check your blood sugar the morning of your surgery when you wake up and every 2 hours  until you get to the Short Stay unit. . If your blood sugar is less than 70 mg/dL, you will need to treat for low blood sugar: o Do not take insulin. o Treat a low blood sugar (less than 70 mg/dL) with  cup of clear juice (cranberry or apple), 4 glucose tablets, OR glucose gel. Recheck blood sugar in 15 minutes after treatment (to make sure it is greater than 70 mg/dL). If your blood sugar is not greater than 70 mg/dL on recheck, call [phone] o  for further instructions. . Report your blood sugar to the short stay nurse when you get to Short Stay.  . If you are admitted to the hospital after surgery: o Your blood sugar will be checked by the staff and you will probably be given insulin after surgery (instead of oral diabetes medicines) to make sure you have good blood sugar levels. o The goal for blood sugar control after surgery is 80-180 mg/dL.      WHAT DO I DO ABOUT MY DIABETES MEDICATION?   Marland Kitchen Do not take oral diabetes medicines (pills) the morning of surgery.Metformin(Glucophage),Januvia       Do not wear jewelry.  Do not wear lotions, powders, or perfumes, or deodorant.  Do  not shave 48 hours prior to surgery.  Men may shave face and neck.  Do not bring valuables to the hospital.  [facility] is not responsible for any belongings or valuables.  Contacts, dentures or bridgework may not be worn into surgery.  Leave your suitcase in the car.  After surgery it may be brought to your room.  For patients admitted to the hospital, discharge time will be determined by your treatment team.  Patients discharged the day of surgery will not be allowed to drive home.    Special instructions:  [place]- Preparing For Surgery  Before surgery, you can play an important role. Because skin is not sterile, your skin needs to be as free of germs as possible. You can reduce the number of germs on your skin by washing with CHG (chlorahexidine gluconate) Soap before surgery.  CHG is an  antiseptic cleaner which kills germs and bonds with the skin to continue killing germs even after washing.    Oral Hygiene is also important to reduce your risk of infection.  Remember - BRUSH YOUR TEETH THE MORNING OF SURGERY WITH YOUR REGULAR TOOTHPASTE  Please do not use if you have an allergy to CHG or antibacterial soaps. If your skin becomes reddened/irritated stop using the CHG.  Do not shave (including legs and underarms) for at least 48 hours prior to first CHG shower. It is OK to shave your face.  Please follow these instructions carefully.   1. Shower the NIGHT BEFORE SURGERY and the MORNING OF SURGERY with CHG.   2. If you chose to wash your hair, wash your hair first as usual with your normal shampoo.  3. After you shampoo, rinse your hair and body thoroughly to remove the shampoo.  4. Use CHG as you would any other liquid soap. You can apply CHG directly to the skin and wash gently with a scrungie or a clean washcloth.   5. Apply the CHG Soap to your body ONLY FROM THE NECK DOWN.  Do not use on open wounds or open sores. Avoid contact with your eyes, ears, mouth and genitals (private parts). Wash Face and genitals (private parts)  with your normal soap.  6. Wash thoroughly, paying special attention to the area where your surgery will be performed.  7. Thoroughly rinse your body with warm water from the neck down.  8. DO NOT shower/wash with your normal soap after using and rinsing off the CHG Soap.  9. Pat yourself dry with a CLEAN TOWEL.  10. Wear CLEAN PAJAMAS to bed the night before surgery, wear comfortable clothes the morning of surgery  11. Place CLEAN SHEETS on your bed the night of your first shower and DO NOT SLEEP WITH PETS.    Day of Surgery:  Do not apply any deodorants/lotions.  Please wear clean clothes to the hospital/surgery center.   Remember to brush your teeth WITH YOUR REGULAR TOOTHPASTE.    Please read over the following fact sheets that you  were given. Coughing and Deep Breathing, MRSA Information and Surgical Site Infection Prevention

## 2017-07-17 ENCOUNTER — Encounter (HOSPITAL_COMMUNITY): Payer: Self-pay | Admitting: Anesthesiology

## 2017-07-17 LAB — BLOOD GAS, ARTERIAL
Acid-base deficit: 2.2 mmol/L — ABNORMAL HIGH (ref 0.0–2.0)
Bicarbonate: 22.3 mmol/L (ref 20.0–28.0)
DRAWN BY: 421801
FIO2: 21
O2 SAT: 97.2 %
PATIENT TEMPERATURE: 98.6
pCO2 arterial: 39.5 mmHg (ref 32.0–48.0)
pH, Arterial: 7.37 (ref 7.350–7.450)
pO2, Arterial: 86.3 mmHg (ref 83.0–108.0)

## 2017-07-17 NOTE — Anesthesia Preprocedure Evaluation (Addendum)
Anesthesia Evaluation  Patient identified by MRN, date of birth, ID band Patient awake    Reviewed: Allergy & Precautions, NPO status , Patient's Chart, lab work & pertinent test results  Airway Mallampati: II  TM Distance: >3 FB Neck ROM: Full    Dental  (+) Teeth Intact, Dental Advisory Given   Pulmonary sleep apnea , COPD, Current Smoker,    breath sounds clear to auscultation       Cardiovascular hypertension, + CAD, + CABG and + Peripheral Vascular Disease  + pacemaker  Rhythm:Regular Rate:Normal     Neuro/Psych negative neurological ROS  negative psych ROS   GI/Hepatic negative GI ROS, Neg liver ROS, GERD  Medicated,  Endo/Other  diabetes, Type 2, Oral Hypoglycemic Agents  Renal/GU negative Renal ROS     Musculoskeletal  (+) Arthritis , Osteoarthritis,    Abdominal Normal abdominal exam  (+)   Peds  Hematology   Anesthesia Other Findings   Reproductive/Obstetrics                            Lab Results  Component Value Date   WBC 7.2 07/16/2017   HGB 12.1 (L) 07/16/2017   HCT 37.0 (L) 07/16/2017   MCV 89.4 07/16/2017   PLT 231 07/16/2017   Lab Results  Component Value Date   CREATININE 1.50 (H) 07/16/2017   BUN 19 07/16/2017   NA 137 07/16/2017   K 4.2 07/16/2017   CL 104 07/16/2017   CO2 20 (L) 07/16/2017   Lab Results  Component Value Date   INR 0.96 07/16/2017     Anesthesia Physical Anesthesia Plan  ASA: III  Anesthesia Plan: General   Post-op Pain Management:    Induction: Intravenous  PONV Risk Score and Plan: 2 and Midazolam and Ondansetron  Airway Management Planned: Oral ETT  Additional Equipment: Arterial line  Intra-op Plan:   Post-operative Plan: Extubation in OR  Informed Consent: I have reviewed the patients History and Physical, chart, labs and discussed the procedure including the risks, benefits and alternatives for the proposed  anesthesia with the patient or authorized representative who has indicated his/her understanding and acceptance.   Dental advisory given  Plan Discussed with: CRNA  Anesthesia Plan Comments:        Anesthesia Quick Evaluation

## 2017-07-17 NOTE — Progress Notes (Signed)
Anesthesia Chart Review:  Case:  [id number] Date/Time:  07/18/17 0715   Procedure:  TRANSCAROTID ARTERY REVASCULARIZATION (Right )   Anesthesia type:  General   Pre-op diagnosis:  right carotid stenosis   Location:  [place] OR ROOM 16 / [place] OR   Surgeon:  Waynetta Sandy, MD      DISCUSSION: Patient is a 62 year old male scheduled for the above procedure.   History includes smoking, DM2, CAD (s/p CABG: LIMA-LAD, SVG-D1, SVG-OM2, SVG-RCA 03/30/11, [facility]), SSS (s/p [employer] [phone] [address] DR MRI PPM 12/09/14), carotid occlusive disease, COPD, HTN, HLD, OSA (does not use CPAP). Preoperative labs and comparison labs from [facility] are consistent with renal insufficiency (Creatinine 1.5). Per 06/19/17 PPM interrogation (see Care Everywhere), he had 21 seconds of AT/AF since last check and was started on Amiodarone and Xarelto with plans for 3 month follow-up. Recent CTA for carotid disease showed high grade RICA stenosis and moderate on the left. TCAR was favored over CEA given the "size of his neck and the fact that it extends up to near the top of C2." Patient had been seen by Dr. Donzetta Matters on 06/21/17, but since he had just received instructions to start Xarelto and amiodarone from cardiology, he wanted to touch base with his cardiologist Dr. Gerarda Gunther to clarify prior to having surgery. Patient reports he spoke with Dr. Gerarda Gunther who wanted him to go ahead and start the amiodarone but would hold off on plans for Xarelto until after his carotid stent.   Patient to continue ASA, Crestor and Plavix even on day of surgery per [facility]. Patient normally takes Crestor in the evening, so will take on 07/17/17 pm, but knows to take ASA and Plavix on the morning of surgery with sips. He can take these with the other medications as instructed at his PAT visit. I clarified these instructions with [facility] RN Zigmund Daniel and with patient.   Based on currently available information, I would anticipate that he can  proceed as planned. PPM perioperative device form is still pending from Dr. Gerarda Gunther. If not received, staff will need to use our emergency cardiac device protocol. [employer] device reps notified of surgery date/time.  VS: BP (!) 113/54   Pulse 66   Temp 36.7 C   Resp 20   Ht 5\' 10"  (1.778 m)   Wt 183 lb 8 oz (83.2 kg)   SpO2 98%   BMI 26.33 kg/m   PROVIDERS: Secundino Ginger, PA-C is PCP at [facility]). Last visit 05/30/17. Gerarda Gunther, Asif, MD is cardiologist at [facility]. Last visit 12/03/16 (see Care Everywhere). He signed a note of cardiac clearance at "moderate" risk (scanned under Media tab).  LABS: Preoperative labs noted. A1c 8.1. H/H 12.1/37.0. PLT 231. PT/PTT WNL. BUN 19/Cr 1.50. Comparison labs from [facility] also show Cr 1.5 on 05/06/17.  (all labs ordered are listed, but only abnormal results are displayed)  Labs Reviewed  GLUCOSE, CAPILLARY - Abnormal; Notable for the following components:      Result Value   Glucose-Capillary 183 (*)    All other components within normal limits  BLOOD GAS, ARTERIAL - Abnormal; Notable for the following components:   Acid-base deficit 2.2 (*)    All other components within normal limits  CBC - Abnormal; Notable for the following components:   RBC 4.14 (*)    Hemoglobin 12.1 (*)    HCT 37.0 (*)    All other components within normal limits  COMPREHENSIVE  METABOLIC PANEL - Abnormal; Notable for the following components:   CO2 20 (*)    Glucose, Bld 181 (*)    Creatinine, Ser 1.50 (*)    GFR calc non Af Amer 49 (*)    GFR calc Af Amer 56 (*)    All other components within normal limits  URINALYSIS, ROUTINE W REFLEX MICROSCOPIC - Abnormal; Notable for the following components:   Protein, ur 30 (*)    All other components within normal limits  HEMOGLOBIN A1C - Abnormal; Notable for the following components:   Hgb A1c MFr Bld 8.1 (*)    All other components within normal limits  SURGICAL PCR SCREEN  APTT   PROTIME-INR  TYPE AND SCREEN  ABO/RH    IMAGES: CTA neck 06/21/17: IMPRESSION: 1. Age advanced atherosclerosis resulting in 70% right and 40% left proximal ICA stenoses. 2. 50% proximal left common carotid artery stenosis. 3. Moderate right vertebral artery origin stenosis. 4. Intracranial atherosclerosis without proximal branch occlusion or flow limiting proximal stenosis. 5. Mild chronic small vessel ischemic disease in the cerebral white matter with chronic right basal ganglia lacunar infarct. 6. Aortic Atherosclerosis (ICD10-I70.0) and Emphysema (ICD10-J43.9).   EKG: 12/03/16: SR, non-specific T wave abnormality.   CV: Echo 12/17/16 ([facility]):  Summary: A complete 2D TTE was preformed. The study was technically difficult.LVEF was grossly normal. EF > 55%. There is a pacemaker lead in the RV. Trace MR. Trace TR. RVSP is normal. Mild aortic sclerosis without stenosis. AV is trileaflet.   Nuclear stress test 10/04/15 [facility]): Impressions:  The resting ECG shows normal sinus rhythm. The resting and stress ECG shows normal ST segment; and no ventricular tachycardia, significant QRS, prolongation or heart block. Both the rest and stress images are within normal limits.  No significant reversible ischemia or fixed scar. Gated left ventricular ejection fraction is normal at 80%.  Normal LV segmental wall motion.  Past Medical History:  Diagnosis Date  . Anemia   . Arthritis   . CAD (coronary artery disease)   . Carotid artery occlusion   . COPD (chronic obstructive pulmonary disease) ([place])   . Diabetes mellitus without complication ([place])   . Dyspnea   . Gout   . Hyperlipidemia   . Hypertension   . OSA (obstructive sleep apnea)    does not use CPAP  . Persistent disorder of initiating or maintaining sleep   . Presence of permanent cardiac pacemaker   . Vitamin D deficiency     Past Surgical History:  Procedure Laterality Date  . BACK SURGERY  1991  . CARDIAC  CATHETERIZATION Right 2013  . CATARACT EXTRACTION Bilateral 1994  . CORONARY ARTERY BYPASS GRAFT  2013  . PACEMAKER INSERTION      MEDICATIONS: . amiodarone (PACERONE) 200 MG tablet  . aspirin EC 81 MG tablet  . atenolol (TENORMIN) 50 MG tablet  . CHANTIX STARTING MONTH PAK 0.5 MG X 11 & 1 MG X 42 tablet  . clopidogrel (PLAVIX) 75 MG tablet  . Dulaglutide 1.5 MG/0.5ML SOPN  . famotidine (PEPCID) 20 MG tablet  . febuxostat (ULORIC) 40 MG tablet  . ferrous sulfate 325 (65 FE) MG EC tablet  . furosemide (LASIX) 20 MG tablet  . latanoprost (XALATAN) 0.005 % ophthalmic solution  . lisinopril (PRINIVIL,ZESTRIL) 10 MG tablet  . metFORMIN (GLUCOPHAGE) 500 MG tablet  . nitroGLYCERIN (NITROSTAT) 0.4 MG SL tablet  . rosuvastatin (CRESTOR) 10 MG tablet  . sitaGLIPtin (JANUVIA) 100 MG tablet  . tiotropium (SPIRIVA) 18  MCG inhalation capsule  . Vitamin D, Ergocalciferol, 2000 units CAPS   No current facility-administered medications for this encounter.    George Hugh [facility] Stay [facility]/Anesthesiology Phone [phone] 07/17/2017 1:33 PM

## 2017-07-18 ENCOUNTER — Telehealth: Payer: Self-pay | Admitting: Vascular Surgery

## 2017-07-18 ENCOUNTER — Inpatient Hospital Stay (HOSPITAL_COMMUNITY): Payer: Medicare HMO

## 2017-07-18 ENCOUNTER — Inpatient Hospital Stay (HOSPITAL_COMMUNITY): Payer: Medicare HMO | Admitting: Vascular Surgery

## 2017-07-18 ENCOUNTER — Encounter (HOSPITAL_COMMUNITY)
Admission: RE | Disposition: A | Discharge status: Home / Self Care | Payer: Self-pay | Source: Ambulatory Visit | Attending: Vascular Surgery

## 2017-07-18 ENCOUNTER — Other Ambulatory Visit: Payer: Self-pay

## 2017-07-18 ENCOUNTER — Encounter (HOSPITAL_COMMUNITY): Payer: Self-pay

## 2017-07-18 ENCOUNTER — Inpatient Hospital Stay (HOSPITAL_COMMUNITY)
Admission: RE | Admit: 2017-07-18 | Discharge: 2017-07-19 | DRG: 036 | Disposition: A | Discharge status: Home / Self Care | Payer: Medicare HMO | Source: Ambulatory Visit | Attending: Vascular Surgery | Admitting: Vascular Surgery

## 2017-07-18 DIAGNOSIS — I6523 Occlusion and stenosis of bilateral carotid arteries: Principal | ICD-10-CM | POA: Diagnosis present

## 2017-07-18 DIAGNOSIS — Z7902 Long term (current) use of antithrombotics/antiplatelets: Secondary | ICD-10-CM | POA: Diagnosis not present

## 2017-07-18 DIAGNOSIS — Z79899 Other long term (current) drug therapy: Secondary | ICD-10-CM | POA: Diagnosis not present

## 2017-07-18 DIAGNOSIS — R402413 Glasgow coma scale score 13-15, at hospital admission: Secondary | ICD-10-CM | POA: Diagnosis present

## 2017-07-18 DIAGNOSIS — Z885 Allergy status to narcotic agent status: Secondary | ICD-10-CM

## 2017-07-18 DIAGNOSIS — G4733 Obstructive sleep apnea (adult) (pediatric): Secondary | ICD-10-CM | POA: Diagnosis present

## 2017-07-18 DIAGNOSIS — E559 Vitamin D deficiency, unspecified: Secondary | ICD-10-CM | POA: Diagnosis present

## 2017-07-18 DIAGNOSIS — M109 Gout, unspecified: Secondary | ICD-10-CM | POA: Diagnosis present

## 2017-07-18 DIAGNOSIS — K219 Gastro-esophageal reflux disease without esophagitis: Secondary | ICD-10-CM | POA: Diagnosis present

## 2017-07-18 DIAGNOSIS — I2510 Atherosclerotic heart disease of native coronary artery without angina pectoris: Secondary | ICD-10-CM | POA: Diagnosis present

## 2017-07-18 DIAGNOSIS — J449 Chronic obstructive pulmonary disease, unspecified: Secondary | ICD-10-CM | POA: Diagnosis present

## 2017-07-18 DIAGNOSIS — F17210 Nicotine dependence, cigarettes, uncomplicated: Secondary | ICD-10-CM | POA: Diagnosis present

## 2017-07-18 DIAGNOSIS — Z7982 Long term (current) use of aspirin: Secondary | ICD-10-CM | POA: Diagnosis not present

## 2017-07-18 DIAGNOSIS — Z950 Presence of cardiac pacemaker: Secondary | ICD-10-CM | POA: Diagnosis not present

## 2017-07-18 DIAGNOSIS — Z7984 Long term (current) use of oral hypoglycemic drugs: Secondary | ICD-10-CM

## 2017-07-18 DIAGNOSIS — E119 Type 2 diabetes mellitus without complications: Secondary | ICD-10-CM | POA: Diagnosis present

## 2017-07-18 DIAGNOSIS — I10 Essential (primary) hypertension: Secondary | ICD-10-CM | POA: Diagnosis present

## 2017-07-18 DIAGNOSIS — I6521 Occlusion and stenosis of right carotid artery: Secondary | ICD-10-CM

## 2017-07-18 DIAGNOSIS — M1990 Unspecified osteoarthritis, unspecified site: Secondary | ICD-10-CM | POA: Diagnosis present

## 2017-07-18 DIAGNOSIS — E785 Hyperlipidemia, unspecified: Secondary | ICD-10-CM | POA: Diagnosis present

## 2017-07-18 DIAGNOSIS — Z951 Presence of aortocoronary bypass graft: Secondary | ICD-10-CM | POA: Diagnosis not present

## 2017-07-18 DIAGNOSIS — D649 Anemia, unspecified: Secondary | ICD-10-CM | POA: Diagnosis present

## 2017-07-18 DIAGNOSIS — I739 Peripheral vascular disease, unspecified: Secondary | ICD-10-CM | POA: Diagnosis present

## 2017-07-18 HISTORY — PX: TRANSCAROTID ARTERY REVASCULARIZATIONÂ: SHX6778

## 2017-07-18 LAB — GLUCOSE, CAPILLARY
GLUCOSE-CAPILLARY: 144 mg/dL — AB (ref 65–99)
GLUCOSE-CAPILLARY: 189 mg/dL — AB (ref 65–99)
GLUCOSE-CAPILLARY: 244 mg/dL — AB (ref 65–99)
Glucose-Capillary: 172 mg/dL — ABNORMAL HIGH (ref 65–99)

## 2017-07-18 LAB — POCT ACTIVATED CLOTTING TIME: Activated Clotting Time: 301 seconds

## 2017-07-18 SURGERY — TRANSCAROTID ARTERY REVASCULARIZATION (TCAR)
Anesthesia: General | Laterality: Right

## 2017-07-18 MED ORDER — POTASSIUM CHLORIDE CRYS ER 20 MEQ PO TBCR: 20.0000 meq | EXTENDED_RELEASE_TABLET | Freq: Every day | ORAL | Status: DC | PRN

## 2017-07-18 MED ORDER — HYDRALAZINE HCL 20 MG/ML IJ SOLN: 5.0000 mg | INTRAMUSCULAR | Status: DC | PRN

## 2017-07-18 MED ORDER — ACETAMINOPHEN 325 MG RE SUPP: 325.0000 mg | RECTAL | Status: DC | PRN

## 2017-07-18 MED ORDER — CHLORHEXIDINE GLUCONATE CLOTH 2 % EX PADS: 6.0000 | MEDICATED_PAD | Freq: Once | CUTANEOUS | Status: DC

## 2017-07-18 MED ORDER — SODIUM CHLORIDE 0.9 % IJ SOLN
INTRAVENOUS | Status: DC | PRN
  Administered 2017-07-18: 09:00:00 via INTRAMUSCULAR

## 2017-07-18 MED ORDER — HYDROMORPHONE HCL 2 MG/ML IJ SOLN
0.2500 mg | INTRAMUSCULAR | Status: DC | PRN
  Administered 2017-07-18 (×2): 0.5 mg via INTRAVENOUS

## 2017-07-18 MED ORDER — PHENYLEPHRINE 40 MCG/ML (10ML) SYRINGE FOR IV PUSH (FOR BLOOD PRESSURE SUPPORT)
PREFILLED_SYRINGE | INTRAVENOUS | Status: AC
  Filled 2017-07-18: qty 10

## 2017-07-18 MED ORDER — PROTAMINE SULFATE 10 MG/ML IV SOLN
INTRAVENOUS | Status: DC | PRN
  Administered 2017-07-18 (×5): 10 mg via INTRAVENOUS

## 2017-07-18 MED ORDER — ACETAMINOPHEN 325 MG PO TABS: 325.0000 mg | ORAL_TABLET | ORAL | Status: DC | PRN

## 2017-07-18 MED ORDER — SUGAMMADEX SODIUM 200 MG/2ML IV SOLN
INTRAVENOUS | Status: DC | PRN
  Administered 2017-07-18: 166 mg via INTRAVENOUS

## 2017-07-18 MED ORDER — LACTATED RINGERS IV SOLN
INTRAVENOUS | Status: DC | PRN
  Administered 2017-07-18 (×2): via INTRAVENOUS

## 2017-07-18 MED ORDER — FERROUS SULFATE 325 (65 FE) MG PO TABS
325.0000 mg | ORAL_TABLET | Freq: Every day | ORAL | Status: DC
  Administered 2017-07-18 – 2017-07-19 (×2): 325 mg via ORAL
  Filled 2017-07-18 (×2): qty 1

## 2017-07-18 MED ORDER — MIDAZOLAM HCL 2 MG/2ML IJ SOLN
INTRAMUSCULAR | Status: AC
  Filled 2017-07-18: qty 2

## 2017-07-18 MED ORDER — LIDOCAINE HCL (CARDIAC) PF 100 MG/5ML IV SOSY
PREFILLED_SYRINGE | INTRAVENOUS | Status: DC | PRN
  Administered 2017-07-18: 15 mg via INTRAVENOUS

## 2017-07-18 MED ORDER — MEPERIDINE HCL 50 MG/ML IJ SOLN: 6.2500 mg | INTRAMUSCULAR | Status: DC | PRN

## 2017-07-18 MED ORDER — GUAIFENESIN-DM 100-10 MG/5ML PO SYRP: 15.0000 mL | ORAL_SOLUTION | ORAL | Status: DC | PRN

## 2017-07-18 MED ORDER — PHENYLEPHRINE 40 MCG/ML (10ML) SYRINGE FOR IV PUSH (FOR BLOOD PRESSURE SUPPORT)
PREFILLED_SYRINGE | INTRAVENOUS | Status: DC | PRN
  Administered 2017-07-18: 40 ug via INTRAVENOUS
  Administered 2017-07-18: 80 ug via INTRAVENOUS
  Administered 2017-07-18 (×3): 40 ug via INTRAVENOUS
  Administered 2017-07-18 (×2): 80 ug via INTRAVENOUS

## 2017-07-18 MED ORDER — SODIUM CHLORIDE 0.9 % IV SOLN
INTRAVENOUS | Status: DC
  Administered 2017-07-18: 100 mL/h via INTRAVENOUS

## 2017-07-18 MED ORDER — METOPROLOL TARTRATE 5 MG/5ML IV SOLN: 2.0000 mg | INTRAVENOUS | Status: DC | PRN

## 2017-07-18 MED ORDER — VARENICLINE TARTRATE 0.5 MG X 11 & 1 MG X 42 PO MISC: 1.0000 | Freq: Two times a day (BID) | ORAL | Status: DC

## 2017-07-18 MED ORDER — ROCURONIUM BROMIDE 100 MG/10ML IV SOLN
INTRAVENOUS | Status: DC | PRN
  Administered 2017-07-18: 50 mg via INTRAVENOUS

## 2017-07-18 MED ORDER — ALUM & MAG HYDROXIDE-SIMETH 200-200-20 MG/5ML PO SUSP
15.0000 mL | ORAL | Status: DC | PRN
Start: 2017-07-18 — End: 2017-07-19

## 2017-07-18 MED ORDER — PROPOFOL 10 MG/ML IV BOLUS
INTRAVENOUS | Status: DC | PRN
  Administered 2017-07-18: 120 mg via INTRAVENOUS

## 2017-07-18 MED ORDER — TIOTROPIUM BROMIDE MONOHYDRATE 18 MCG IN CAPS
18.0000 ug | ORAL_CAPSULE | Freq: Every day | RESPIRATORY_TRACT | Status: DC
  Filled 2017-07-18: qty 5

## 2017-07-18 MED ORDER — 0.9 % SODIUM CHLORIDE (POUR BTL) OPTIME
TOPICAL | Status: DC | PRN
  Administered 2017-07-18: 2000 mL

## 2017-07-18 MED ORDER — ASPIRIN EC 81 MG PO TBEC
81.0000 mg | DELAYED_RELEASE_TABLET | Freq: Every day | ORAL | Status: DC
  Administered 2017-07-19: 81 mg via ORAL
  Filled 2017-07-18: qty 1

## 2017-07-18 MED ORDER — TIOTROPIUM BROMIDE MONOHYDRATE 18 MCG IN CAPS
18.0000 ug | ORAL_CAPSULE | Freq: Every day | RESPIRATORY_TRACT | Status: DC
  Administered 2017-07-19: 18 ug via RESPIRATORY_TRACT
  Filled 2017-07-18: qty 5

## 2017-07-18 MED ORDER — MORPHINE SULFATE (PF) 2 MG/ML IV SOLN: 2.0000 mg | INTRAVENOUS | Status: DC | PRN

## 2017-07-18 MED ORDER — SODIUM CHLORIDE 0.9 % IV SOLN: INTRAVENOUS | Status: DC

## 2017-07-18 MED ORDER — VARENICLINE TARTRATE 0.5 MG PO TABS
0.5000 mg | ORAL_TABLET | Freq: Two times a day (BID) | ORAL | Status: DC
Start: 2017-07-18 — End: 2017-07-19
  Administered 2017-07-18 – 2017-07-19 (×2): 0.5 mg via ORAL
  Filled 2017-07-18 (×3): qty 1

## 2017-07-18 MED ORDER — LACTATED RINGERS IV SOLN: INTRAVENOUS | Status: DC

## 2017-07-18 MED ORDER — HYDROMORPHONE HCL 2 MG/ML IJ SOLN
INTRAMUSCULAR | Status: AC
  Filled 2017-07-18: qty 1

## 2017-07-18 MED ORDER — SUGAMMADEX SODIUM 200 MG/2ML IV SOLN
INTRAVENOUS | Status: AC
  Filled 2017-07-18: qty 2

## 2017-07-18 MED ORDER — POLYETHYLENE GLYCOL 3350 17 G PO PACK: 17.0000 g | PACK | Freq: Every day | ORAL | Status: DC | PRN

## 2017-07-18 MED ORDER — LIDOCAINE HCL (PF) 1 % IJ SOLN
INTRAMUSCULAR | Status: AC
  Filled 2017-07-18: qty 30

## 2017-07-18 MED ORDER — DEXAMETHASONE SODIUM PHOSPHATE 10 MG/ML IJ SOLN
INTRAMUSCULAR | Status: DC | PRN
  Administered 2017-07-18: 4 mg via INTRAVENOUS

## 2017-07-18 MED ORDER — HEPARIN SODIUM (PORCINE) 1000 UNIT/ML IJ SOLN
INTRAMUSCULAR | Status: DC | PRN
  Administered 2017-07-18: 10000 [IU] via INTRAVENOUS

## 2017-07-18 MED ORDER — INSULIN ASPART 100 UNIT/ML ~~LOC~~ SOLN
0.0000 [IU] | Freq: Three times a day (TID) | SUBCUTANEOUS | Status: DC
  Administered 2017-07-18: 3 [IU] via SUBCUTANEOUS
  Administered 2017-07-19: 5 [IU] via SUBCUTANEOUS
  Administered 2017-07-19: 3 [IU] via SUBCUTANEOUS

## 2017-07-18 MED ORDER — DEXAMETHASONE SODIUM PHOSPHATE 10 MG/ML IJ SOLN
INTRAMUSCULAR | Status: AC
  Filled 2017-07-18: qty 1

## 2017-07-18 MED ORDER — DOCUSATE SODIUM 100 MG PO CAPS
100.0000 mg | ORAL_CAPSULE | Freq: Every day | ORAL | Status: DC
  Filled 2017-07-18: qty 1

## 2017-07-18 MED ORDER — ONDANSETRON HCL 4 MG/2ML IJ SOLN
INTRAMUSCULAR | Status: DC | PRN
  Administered 2017-07-18: 4 mg via INTRAVENOUS

## 2017-07-18 MED ORDER — SODIUM CHLORIDE 0.9 % IV SOLN
INTRAVENOUS | Status: DC | PRN
  Administered 2017-07-18: 09:00:00

## 2017-07-18 MED ORDER — OXYCODONE-ACETAMINOPHEN 5-325 MG PO TABS: 1.0000 | ORAL_TABLET | ORAL | Status: DC | PRN

## 2017-07-18 MED ORDER — SODIUM CHLORIDE 0.9 % IV SOLN: 500.0000 mL | Freq: Once | INTRAVENOUS | Status: DC | PRN

## 2017-07-18 MED ORDER — PHENYLEPHRINE HCL 10 MG/ML IJ SOLN
INTRAVENOUS | Status: DC | PRN
  Administered 2017-07-18: 40 ug/min via INTRAVENOUS

## 2017-07-18 MED ORDER — MIDAZOLAM HCL 5 MG/5ML IJ SOLN
INTRAMUSCULAR | Status: DC | PRN
  Administered 2017-07-18: 1 mg via INTRAVENOUS

## 2017-07-18 MED ORDER — FUROSEMIDE 20 MG PO TABS
20.0000 mg | ORAL_TABLET | Freq: Every day | ORAL | Status: DC
  Administered 2017-07-18 – 2017-07-19 (×2): 20 mg via ORAL
  Filled 2017-07-18 (×2): qty 1

## 2017-07-18 MED ORDER — FAMOTIDINE 20 MG PO TABS
20.0000 mg | ORAL_TABLET | Freq: Every day | ORAL | Status: DC
  Administered 2017-07-19: 20 mg via ORAL
  Filled 2017-07-18: qty 1

## 2017-07-18 MED ORDER — LINAGLIPTIN 5 MG PO TABS
5.0000 mg | ORAL_TABLET | Freq: Every day | ORAL | Status: DC
  Administered 2017-07-19: 5 mg via ORAL
  Filled 2017-07-18: qty 1

## 2017-07-18 MED ORDER — SODIUM CHLORIDE 0.9 % IJ SOLN
INTRAMUSCULAR | Status: AC
  Filled 2017-07-18: qty 10

## 2017-07-18 MED ORDER — NITROGLYCERIN 0.4 MG SL SUBL: 0.4000 mg | SUBLINGUAL_TABLET | SUBLINGUAL | Status: DC | PRN

## 2017-07-18 MED ORDER — CEFAZOLIN SODIUM-DEXTROSE 2-4 GM/100ML-% IV SOLN
2.0000 g | Freq: Three times a day (TID) | INTRAVENOUS | Status: AC
  Administered 2017-07-18 (×2): 2 g via INTRAVENOUS
  Filled 2017-07-18 (×2): qty 100

## 2017-07-18 MED ORDER — BISACODYL 10 MG RE SUPP: 10.0000 mg | Freq: Every day | RECTAL | Status: DC | PRN

## 2017-07-18 MED ORDER — CEFAZOLIN SODIUM-DEXTROSE 2-4 GM/100ML-% IV SOLN
2.0000 g | INTRAVENOUS | Status: AC
  Administered 2017-07-18: 2 g via INTRAVENOUS
  Filled 2017-07-18: qty 100

## 2017-07-18 MED ORDER — LABETALOL HCL 5 MG/ML IV SOLN: 10.0000 mg | INTRAVENOUS | Status: DC | PRN

## 2017-07-18 MED ORDER — PROMETHAZINE HCL 25 MG/ML IJ SOLN: 6.2500 mg | INTRAMUSCULAR | Status: DC | PRN

## 2017-07-18 MED ORDER — ATENOLOL 25 MG PO TABS
50.0000 mg | ORAL_TABLET | Freq: Two times a day (BID) | ORAL | Status: DC
  Administered 2017-07-18 – 2017-07-19 (×2): 50 mg via ORAL
  Filled 2017-07-18 (×2): qty 2

## 2017-07-18 MED ORDER — ROCURONIUM BROMIDE 10 MG/ML (PF) SYRINGE
PREFILLED_SYRINGE | INTRAVENOUS | Status: AC
  Filled 2017-07-18: qty 5

## 2017-07-18 MED ORDER — LIDOCAINE 2% (20 MG/ML) 5 ML SYRINGE
INTRAMUSCULAR | Status: AC
  Filled 2017-07-18: qty 5

## 2017-07-18 MED ORDER — HEPARIN SODIUM (PORCINE) 1000 UNIT/ML IJ SOLN
INTRAMUSCULAR | Status: AC
  Filled 2017-07-18: qty 1

## 2017-07-18 MED ORDER — VITAMIN D 1000 UNITS PO TABS
2000.0000 [IU] | ORAL_TABLET | Freq: Two times a day (BID) | ORAL | Status: DC
  Administered 2017-07-18 – 2017-07-19 (×2): 2000 [IU] via ORAL
  Filled 2017-07-18 (×5): qty 2

## 2017-07-18 MED ORDER — PROPOFOL 10 MG/ML IV BOLUS
INTRAVENOUS | Status: AC
  Filled 2017-07-18: qty 20

## 2017-07-18 MED ORDER — ONDANSETRON HCL 4 MG/2ML IJ SOLN
INTRAMUSCULAR | Status: AC
  Filled 2017-07-18: qty 2

## 2017-07-18 MED ORDER — LATANOPROST 0.005 % OP SOLN
1.0000 [drp] | Freq: Every day | OPHTHALMIC | Status: DC
  Administered 2017-07-18: 1 [drp] via OPHTHALMIC
  Filled 2017-07-18: qty 2.5

## 2017-07-18 MED ORDER — PANTOPRAZOLE SODIUM 40 MG PO TBEC
40.0000 mg | DELAYED_RELEASE_TABLET | Freq: Every day | ORAL | Status: DC
  Administered 2017-07-18 – 2017-07-19 (×2): 40 mg via ORAL
  Filled 2017-07-18: qty 1

## 2017-07-18 MED ORDER — PHENOL 1.4 % MT LIQD: 1.0000 | OROMUCOSAL | Status: DC | PRN

## 2017-07-18 MED ORDER — NITROGLYCERIN 0.2 MG/ML ON CALL CATH LAB
INTRAVENOUS | Status: DC | PRN
  Administered 2017-07-18 (×2): 20 ug via INTRAVENOUS
  Administered 2017-07-18: 40 ug via INTRAVENOUS

## 2017-07-18 MED ORDER — ROSUVASTATIN CALCIUM 10 MG PO TABS
10.0000 mg | ORAL_TABLET | Freq: Every day | ORAL | Status: DC
  Administered 2017-07-18: 10 mg via ORAL

## 2017-07-18 MED ORDER — FERROUS SULFATE 325 (65 FE) MG PO TBEC
325.0000 mg | DELAYED_RELEASE_TABLET | Freq: Every day | ORAL | Status: DC
Start: 2017-07-18 — End: 2017-07-18
  Filled 2017-07-18 (×2): qty 1

## 2017-07-18 MED ORDER — SUGAMMADEX SODIUM 500 MG/5ML IV SOLN
INTRAVENOUS | Status: AC
  Filled 2017-07-18: qty 5

## 2017-07-18 MED ORDER — FENTANYL CITRATE (PF) 250 MCG/5ML IJ SOLN
INTRAMUSCULAR | Status: AC
  Filled 2017-07-18: qty 5

## 2017-07-18 MED ORDER — GLYCOPYRROLATE 0.2 MG/ML IJ SOLN
INTRAMUSCULAR | Status: DC | PRN
  Administered 2017-07-18 (×2): 0.2 mg via INTRAVENOUS

## 2017-07-18 MED ORDER — LABETALOL HCL 5 MG/ML IV SOLN
INTRAVENOUS | Status: DC | PRN
  Administered 2017-07-18: 5 mg via INTRAVENOUS

## 2017-07-18 MED ORDER — HEMOSTATIC AGENTS (NO CHARGE) OPTIME
TOPICAL | Status: DC | PRN
  Administered 2017-07-18: 1 via TOPICAL

## 2017-07-18 MED ORDER — PROTAMINE SULFATE 10 MG/ML IV SOLN
INTRAVENOUS | Status: AC
  Filled 2017-07-18: qty 5

## 2017-07-18 MED ORDER — OXYCODONE-ACETAMINOPHEN 5-325 MG PO TABS: 1.0000 | ORAL_TABLET | Freq: Four times a day (QID) | ORAL | 0 refills | Status: DC | PRN

## 2017-07-18 MED ORDER — CLOPIDOGREL BISULFATE 75 MG PO TABS
75.0000 mg | ORAL_TABLET | Freq: Every day | ORAL | Status: DC
  Administered 2017-07-19: 75 mg via ORAL
  Filled 2017-07-18: qty 1

## 2017-07-18 MED ORDER — ONDANSETRON HCL 4 MG/2ML IJ SOLN: 4.0000 mg | Freq: Four times a day (QID) | INTRAMUSCULAR | Status: DC | PRN

## 2017-07-18 MED ORDER — LABETALOL HCL 5 MG/ML IV SOLN
INTRAVENOUS | Status: AC
  Filled 2017-07-18: qty 4

## 2017-07-18 MED ORDER — FENTANYL CITRATE (PF) 100 MCG/2ML IJ SOLN
INTRAMUSCULAR | Status: DC | PRN
  Administered 2017-07-18: 100 ug via INTRAVENOUS
  Administered 2017-07-18: 50 ug via INTRAVENOUS

## 2017-07-18 MED ORDER — AMIODARONE HCL 200 MG PO TABS
200.0000 mg | ORAL_TABLET | Freq: Every day | ORAL | Status: DC
  Administered 2017-07-19: 200 mg via ORAL
  Filled 2017-07-18: qty 1

## 2017-07-18 MED ORDER — EPHEDRINE SULFATE 50 MG/ML IJ SOLN
INTRAMUSCULAR | Status: AC
  Filled 2017-07-18: qty 1

## 2017-07-18 MED ORDER — SODIUM CHLORIDE 0.9 % IV SOLN
INTRAVENOUS | Status: AC
  Filled 2017-07-18: qty 1.2

## 2017-07-18 MED ORDER — FEBUXOSTAT 40 MG PO TABS
40.0000 mg | ORAL_TABLET | Freq: Every day | ORAL | Status: DC
  Administered 2017-07-19: 40 mg via ORAL
  Filled 2017-07-18 (×2): qty 1

## 2017-07-18 SURGICAL SUPPLY — 71 items
BAG BANDED W/RUBBER/TAPE 36X54 (MISCELLANEOUS) ×3 IMPLANT
BALLN STERLING RX 5X30X80 (BALLOONS) ×3
BALLOON STERLING RX 5X30X80 (BALLOONS) ×1 IMPLANT
CANISTER SUCT 3000ML PPV (MISCELLANEOUS) ×3 IMPLANT
CATH ROBINSON RED A/P 18FR (CATHETERS) IMPLANT
CLIP VESOCCLUDE MED 24/CT (CLIP) ×3 IMPLANT
CLIP VESOCCLUDE SM WIDE 24/CT (CLIP) ×3 IMPLANT
COVER DOME SNAP 22 D (MISCELLANEOUS) ×3 IMPLANT
COVER PROBE W GEL 5X96 (DRAPES) ×3 IMPLANT
CRADLE DONUT ADULT HEAD (MISCELLANEOUS) ×3 IMPLANT
DERMABOND ADVANCED (GAUZE/BANDAGES/DRESSINGS) ×2
DERMABOND ADVANCED .7 DNX12 (GAUZE/BANDAGES/DRESSINGS) ×1 IMPLANT
DRAIN CHANNEL 15F RND FF W/TCR (WOUND CARE) IMPLANT
DRAPE INCISE IOBAN 66X45 STRL (DRAPES) ×6 IMPLANT
DRAPE INCISE IOBAN 85X60 (DRAPES) ×6 IMPLANT
DRAPE UNIVERSAL PACK (DRAPES) ×3 IMPLANT
DRSG TEGADERM 2-3/8X2-3/4 SM (GAUZE/BANDAGES/DRESSINGS) ×3 IMPLANT
ELECT REM PT RETURN 9FT ADLT (ELECTROSURGICAL) ×3
ELECTRODE REM PT RTRN 9FT ADLT (ELECTROSURGICAL) ×1 IMPLANT
EVACUATOR SILICONE 100CC (DRAIN) IMPLANT
GLOVE BIO SURGEON STRL SZ7.5 (GLOVE) ×3 IMPLANT
GOWN STRL REUS W/ TWL LRG LVL3 (GOWN DISPOSABLE) ×2 IMPLANT
GOWN STRL REUS W/ TWL XL LVL3 (GOWN DISPOSABLE) ×1 IMPLANT
GOWN STRL REUS W/TWL LRG LVL3 (GOWN DISPOSABLE) ×4
GOWN STRL REUS W/TWL XL LVL3 (GOWN DISPOSABLE) ×2
GUIDEWIRE ENROUTE 0.014 (WIRE) ×6 IMPLANT
HEMOSTAT SNOW SURGICEL 2X4 (HEMOSTASIS) IMPLANT
HEMOSTAT SPONGE AVITENE ULTRA (HEMOSTASIS) ×3 IMPLANT
INSERT FOGARTY SM (MISCELLANEOUS) IMPLANT
INTRODUCER KIT GALT 7CM (INTRODUCER) ×2
IV ADAPTER SYR DOUBLE MALE LL (MISCELLANEOUS) IMPLANT
KIT BASIN OR (CUSTOM PROCEDURE TRAY) ×3 IMPLANT
KIT ENCORE 26 ADVANTAGE (KITS) ×6 IMPLANT
KIT INTRODUCER GALT 7 (INTRODUCER) ×1 IMPLANT
KIT TURNOVER KIT B (KITS) ×3 IMPLANT
NEEDLE HYPO 25GX1X1/2 BEV (NEEDLE) IMPLANT
NEEDLE PERC 18GX7CM (NEEDLE) ×3 IMPLANT
NEEDLE SPNL 20GX3.5 QUINCKE YW (NEEDLE) IMPLANT
PACK CAROTID (CUSTOM PROCEDURE TRAY) ×3 IMPLANT
PAD ARMBOARD 7.5X6 YLW CONV (MISCELLANEOUS) ×6 IMPLANT
PROTECTION STATION PRESSURIZED (MISCELLANEOUS) ×3
SET MICROPUNCTURE 5F STIFF (MISCELLANEOUS) ×3 IMPLANT
SHEATH AVANTI 11CM 5FR (SHEATH) IMPLANT
SHUNT CAROTID BYPASS 10 (VASCULAR PRODUCTS) IMPLANT
SHUNT CAROTID BYPASS 12FRX15.5 (VASCULAR PRODUCTS) IMPLANT
STATION PROTECTION PRESSURIZED (MISCELLANEOUS) ×1 IMPLANT
STENT TRANSCAROTID SYSTEM 9X40 (Permanent Stent) ×3 IMPLANT
STOPCOCK 4 WAY LG BORE MALE ST (IV SETS) ×3 IMPLANT
SUT ETHILON 3 0 PS 1 (SUTURE) IMPLANT
SUT MNCRL AB 4-0 PS2 18 (SUTURE) ×3 IMPLANT
SUT PROLENE 5 0 C 1 24 (SUTURE) ×6 IMPLANT
SUT PROLENE 6 0 BV (SUTURE) IMPLANT
SUT PROLENE 7 0 BV 1 (SUTURE) IMPLANT
SUT SILK 2 0 SH CR/8 (SUTURE) ×3 IMPLANT
SUT SILK 3 0 (SUTURE)
SUT SILK 3-0 18XBRD TIE 12 (SUTURE) IMPLANT
SUT VIC AB 3-0 SH 27 (SUTURE) ×2
SUT VIC AB 3-0 SH 27X BRD (SUTURE) ×1 IMPLANT
SUT VICRYL 4-0 PS2 18IN ABS (SUTURE) ×3 IMPLANT
SYR 20CC LL (SYRINGE) ×3 IMPLANT
SYR 5ML LL (SYRINGE) ×3 IMPLANT
SYR CONTROL 10ML LL (SYRINGE) IMPLANT
SYRINGE 10CC LL (SYRINGE) ×9 IMPLANT
SYSTEM TRANSCAROTID NEUROPRTCT (MISCELLANEOUS) ×2 IMPLANT
TOWEL GREEN STERILE (TOWEL DISPOSABLE) ×3 IMPLANT
TRANSCAROTID NEUROPROTECT SYS (MISCELLANEOUS) ×6
TUBING ART PRESS 48 MALE/FEM (TUBING) IMPLANT
TUBING EXTENTION W/L.L. (IV SETS) IMPLANT
WATER STERILE IRR 1000ML POUR (IV SOLUTION) ×3 IMPLANT
WIRE AMPLATZ SS-J .035X180CM (WIRE) IMPLANT
WIRE BENTSON .035X145CM (WIRE) ×3 IMPLANT

## 2017-07-18 NOTE — Telephone Encounter (Signed)
sch appt 08/15/17 4pm carotid 08/16/17 830 am p/o MD s/p TCAR per stf msg

## 2017-07-18 NOTE — Telephone Encounter (Signed)
-----   Message from Penni Homans, RN sent at 07/18/2017 11:47 AM EDT ----- Regarding: Appointment   ----- Message ----- From: Gabriel Earing, PA-C Sent: 07/18/2017  10:12 AM To: [facility] Charge Pool  S/p TCAR.  F/u with Dr. Donzetta Matters in 4 weeks with carotid duplex.  Thanks

## 2017-07-18 NOTE — Anesthesia Postprocedure Evaluation (Signed)
Anesthesia Post Note  Patient: Dean Ellison  Procedure(s) Performed: TRANSCAROTID ARTERY REVASCULARIZATION (Right )     Patient location during evaluation: PACU Anesthesia Type: General Level of consciousness: awake and alert Pain management: pain level controlled Vital Signs Assessment: post-procedure vital signs reviewed and stable Respiratory status: spontaneous breathing, nonlabored ventilation, respiratory function stable and patient connected to nasal cannula oxygen Cardiovascular status: blood pressure returned to baseline and stable Postop Assessment: no apparent nausea or vomiting Anesthetic complications: no    Last Vitals:  Vitals:   07/18/17 1430 07/18/17 1449  BP:  117/68  Pulse: 63 64  Resp: 17 14  Temp: (!) 36.3 C 36.6 C  SpO2: 100% 100%    Last Pain:  Vitals:   07/18/17 1449  TempSrc: Oral  PainSc:                  [name]

## 2017-07-18 NOTE — Interval H&P Note (Signed)
   History and Physical Update  The patient was interviewed and re-examined.  The patient has been taking his ASA, Plavix and Crestor.  The patient's previous History and Physical has been reviewed and is unchanged from Dr. Donzetta Matters consult except for: interval review for TCAR.  Per Dr. Donzetta Matters, plan is unchanged R TCAR.   Dr. Donzetta Matters previous has discussed with the patient the nature of TCAR which is a hybrid procedure consistenting open exposure of the carotid artery and angiographic intervention on the carotid artery.  The patient is aware of that the risks include but are not limited to: bleeding, infection, access site complications, renal failure, embolization leading to stroke, rupture of vessel, dissection, nerve damage arteriovenous fistula, possible need for emergent surgical intervention, possible need for surgical procedures to treat the patient's pathology, anaphylactic reaction to contrast, and stroke and death.    The patient is aware of the risks and agrees to proceed.   Adele Barthel, MD, FACS [facility] [place] Office: [phone] Pager: [phone]  07/18/2017, 7:05 AM

## 2017-07-18 NOTE — Anesthesia Procedure Notes (Signed)
Arterial Line Insertion Start/End5/23/2019 6:50 AM, 07/18/2017 7:00 AM Performed by: CRNA  Patient location: Pre-op. Preanesthetic checklist: patient identified, IV checked, site marked, risks and benefits discussed, surgical consent, monitors and equipment checked, pre-op evaluation, timeout performed and anesthesia consent Lidocaine 1% used for infiltration Left, radial was placed Catheter size: 20 G Hand hygiene performed , maximum sterile barriers used  and Seldinger technique used Allen's test indicative of satisfactory collateral circulation Attempts: 1 Procedure performed without using ultrasound guided technique. Following insertion, dressing applied and Biopatch. Post procedure assessment: normal  Patient tolerated the procedure well with no immediate complications. Additional procedure comments: By A. Short SRNA.

## 2017-07-18 NOTE — Anesthesia Procedure Notes (Signed)
Procedure Name: Intubation Date/Time: 07/18/2017 7:43 AM Performed by: Renato Shin, CRNA Pre-anesthesia Checklist: Patient identified, Emergency Drugs available, Suction available, Patient being monitored and Timeout performed Patient Re-evaluated:Patient Re-evaluated prior to induction Oxygen Delivery Method: Circle system utilized Preoxygenation: Pre-oxygenation with 100% oxygen Induction Type: IV induction Ventilation: Two handed mask ventilation required and Oral airway inserted - appropriate to patient size Laryngoscope Size: Miller Grade View: Grade I Tube type: Oral Tube size: 7.5 mm Number of attempts: 1 Airway Equipment and Method: Patient positioned with wedge pillow and Stylet Placement Confirmation: ETT inserted through vocal cords under direct vision,  positive ETCO2 and breath sounds checked- equal and bilateral Secured at: 23 cm Tube secured with: Tape Dental Injury: Teeth and Oropharynx as per pre-operative assessment  Comments: Performed by Garner Gavel SRNA under direct supervision

## 2017-07-18 NOTE — Progress Notes (Signed)
  Day of Surgery Note    Subjective:  Says his neck is a little sore   Vitals:   07/18/17 0548 07/18/17 0934  BP: (!) 168/78   Pulse: 68   Resp: 20   Temp: 98.6 F (37 C) (!) 97.2 F (36.2 C)  SpO2: 100%     Incisions:   Right neck and left groin are clean and dry without hematoma Extremities:  Moving all extremities equally Cardiac:  regular Lungs:  Non labored Neuro:  In tact; tongue is midline   Assessment/Plan:  This is a 62 y.o. male who is s/p  Right trans-carotid artery stent with 9 x 40 mm en route stent using neuro protection with flow reversal   -pt doing well in pacu and neuro in tact -if he does well today, anticipate discharge home tomorrow -to [place] later this morning   Leontine Locket, PA-C 07/18/2017 10:08 AM [phone]

## 2017-07-18 NOTE — Transfer of Care (Addendum)
Immediate Anesthesia Transfer of Care Note  Patient: Dean Ellison  Procedure(s) Performed: TRANSCAROTID ARTERY REVASCULARIZATION (Right )  Patient Location: PACU  Anesthesia Type:General  Level of Consciousness: alert   Airway & Oxygen Therapy: Patient Spontanous Breathing and Patient connected to face mask oxygen  Post-op Assessment: Report given to RN and Post -op Vital signs reviewed and stable  Post vital signs: Reviewed and stable  Last Vitals:  Vitals Value Taken Time  BP    Temp    Pulse 74 07/18/2017  9:34 AM  Resp    SpO2 99 % 07/18/2017  9:34 AM  Vitals shown include unvalidated device data.  Last Pain:  Vitals:   07/18/17 0617  TempSrc:   PainSc: 0-No pain         Complications: No apparent anesthesia complications

## 2017-07-18 NOTE — Progress Notes (Signed)
Spoke with Tomi Bamberger with [employer] morning of surgery.  I informed her that we did not receive the order form from patient's cardiologist, Dr. Gerarda Gunther.  Tomi Bamberger stated that we should use the emergency guidelines and if Dr. Smith Robert would like her to come in she would.

## 2017-07-18 NOTE — Op Note (Signed)
Patient name: Dean Ellison MRN: [id number] DOB: [birth year] Sex: male  07/18/2017 Pre-operative Diagnosis: Asymptomatic high-grade right carotid stenosis Post-operative diagnosis:  Same Surgeon:  Erlene Quan C. Donzetta Matters, MD Assistant:  Adele Barthel, MD Procedure Performed: Right trans-carotid artery stent with 9 x 40 mm en route stent using neuro protection with flow reversal   Indications: 62 year old male with asymptomatic high-grade right carotid stenosis.  This lesion was by preoperative CT noted to be in middle of C2 and he was indicated for trans-carotid artery stenting.  Findings: There is  an 80% stenosis in the proximal ICA disease down into the distal common carotid artery.  Following stenting there is 0% residual stenosis and flow briskly into the distal ICA.   Procedure:  The patient was identified in the holding area and taken to  the operating room where he was placed supine on operative table and general anesthesia was induced.  He was sterilely prepped and draped in the right neck bilateral groins given antibiotics and timeout was called.  We began with ultrasound-guided evaluation of the carotid artery which was noted to be 2 cm deep and the patient was given 10,000 units of heparin.  A 3 cm longitudinal incision was then made between the 2 heads of the sternocleidomastoid dissected down onto the common carotid artery.  At the same time the left femoral vein was identified with ultrasound cannulated with an 18-gauge needle and a wire was placed followed by the 8 French sheath and this was flushed with heparinized saline and sutured to the skin.  We placed a vessel loop as well as umbilical tape around the common carotid artery as proximal as possible.  A 5-0 Prolene figure-of-eight suture was placed in the middle of the common carotid artery.  This was then cannulated with a micropuncture needle and the wire was placed followed by a micropuncture sheath for 3 cm.  Angiogram was performed in  2 views.  We then exchanged for the T car sheath.  This was sutured in place in 2 locations.  We then we performed angiography to I did notify our stenosis in our internal carotid artery.  We performed a T car timeout and demonstrated flow reversal passively.  ACT was greater than 300.  We then clamped the common carotid artery by tightening down on our vessel loop.  Active flow reversal was confirmed.  We used the balloon and wire to cross the lesion and noted the wire into the petrous portion.  We then predilated the lesion with a 5 x 30 mm balloon at nominal pressure.  A 9 x 40 mm stent was then placed.  After 2 minutes we performed 2 view angiography which demonstrated no residual waist or stenosis.  Satisfied with this we removed our wire and unhooked our flow reversal.  The sheath in the groin was removed and pressure held for 10 minutes until hemostasis obtained.  Sheath in the common carotid artery was removed and the figure-of-eight suture was cinched down.  We did place one further suture hemostasis.  50 mg of protamine was given which patient tolerated well.  We then closed the platysma with Vicryl in the subcutaneous tissue with 4-0 suture.  Dermabond was placed at the level of the skin.  He was then allowed to wake from anesthesia and was noted to be neurologically intact.  He was transferred to the PACU in stable condition.  Estimated blood loss 25 cc.     Jarry Manon C. Donzetta Matters, MD [facility] [place] Office: [phone] Pager: [phone]

## 2017-07-19 ENCOUNTER — Encounter (HOSPITAL_COMMUNITY): Payer: Self-pay | Admitting: Vascular Surgery

## 2017-07-19 LAB — BASIC METABOLIC PANEL
Anion gap: 11 (ref 5–15)
BUN: 23 mg/dL — ABNORMAL HIGH (ref 6–20)
CALCIUM: 9 mg/dL (ref 8.9–10.3)
CO2: 25 mmol/L (ref 22–32)
Chloride: 101 mmol/L (ref 101–111)
Creatinine, Ser: 1.54 mg/dL — ABNORMAL HIGH (ref 0.61–1.24)
GFR, EST AFRICAN AMERICAN: 54 mL/min — AB (ref >60)
GFR, EST NON AFRICAN AMERICAN: 47 mL/min — AB (ref >60)
Glucose, Bld: 250 mg/dL — ABNORMAL HIGH (ref 65–99)
Potassium: 4.3 mmol/L (ref 3.5–5.1)
SODIUM: 137 mmol/L (ref 135–145)

## 2017-07-19 LAB — CBC
HCT: 31.1 % — ABNORMAL LOW (ref 39.0–52.0)
HEMOGLOBIN: 10.3 g/dL — AB (ref 13.0–17.0)
MCH: 29.4 pg (ref 26.0–34.0)
MCHC: 33.1 g/dL (ref 30.0–36.0)
MCV: 88.9 fL (ref 78.0–100.0)
PLATELETS: 218 10*3/uL (ref 150–400)
RBC: 3.5 MIL/uL — ABNORMAL LOW (ref 4.22–5.81)
RDW: 13.2 % (ref 11.5–15.5)
WBC: 9.1 10*3/uL (ref 4.0–10.5)

## 2017-07-19 LAB — GLUCOSE, CAPILLARY
GLUCOSE-CAPILLARY: 165 mg/dL — AB (ref 65–99)
Glucose-Capillary: 244 mg/dL — ABNORMAL HIGH (ref 65–99)

## 2017-07-19 NOTE — Discharge Instructions (Signed)
[facility]  Discharge Instructions   Carotid Surgery Instructions  Please refer to the following instructions for your post-procedure care. Your surgeon or physician assistant will discuss any changes with you.  Activity  You are encouraged to walk as much as you can. You can slowly return to normal activities but must avoid strenuous activity and heavy lifting until your doctor tell you it's okay. Avoid activities such as vacuuming or swinging a golf club. You can drive after one week if you are comfortable and you are no longer taking prescription pain medications. It is normal to feel tired for serval weeks after your surgery. It is also normal to have difficulty with sleep habits, eating, and bowel movements after surgery. These will go away with time.  Bathing/Showering  Shower daily after you go home. Do not soak in a bathtub, hot tub, or swim until the incision heals completely.  Incision Care  Shower every day. Clean your incision with mild soap and water. Pat the area dry with a clean towel. You do not need a bandage unless otherwise instructed. Do not apply any ointments or creams to your incision. You may have skin glue on your incision. Do not peel it off. It will come off on its own in about one week. Your incision may feel thickened and raised for several weeks after your surgery. This is normal and the skin will soften over time.   For Men Only: It's okay to shave around the incision but do not shave the incision itself for 2 weeks. It is common to have numbness under your chin that could last for several months.  Diet  Resume your normal diet. There are no special food restrictions following this procedure. A low fat/low cholesterol diet is recommended for all patients with vascular disease. In order to heal from your surgery, it is CRITICAL to get adequate nutrition. Your body requires vitamins, minerals, and protein. Vegetables are the  best source of vitamins and minerals. Vegetables also provide the perfect balance of protein. Processed food has little nutritional value, so try to avoid this.  Medications  Resume taking all of your medications unless your doctor or physician assistant tells you not to. If your incision is causing pain, you may take over-the- counter pain relievers such as acetaminophen (Tylenol). If you were prescribed a stronger pain medication, please be aware these medications can cause nausea and constipation. Prevent nausea by taking the medication with a snack or meal. Avoid constipation by drinking plenty of fluids and eating foods with a high amount of fiber, such as fruits, vegetables, and grains.  Do not take Tylenol if you are taking prescription pain medications.  Hold on taking Metformin until you have had your kidney function re-checked by your PCP.  Follow Up  Our office will schedule a follow up appointment 2-3 weeks following discharge.  Please call us immediately for any of the following conditions   Increased pain, redness, drainage (pus) from your incision site.  Fever of 101 degrees or higher.  If you should develop stroke (slurred speech, difficulty swallowing, weakness on one side of your body, loss of vision) you should call 911 and go to the nearest emergency room.   Reduce your risk of vascular disease:   Stop smoking. If you would like help call [facility] at 1-800-QUIT-NOW 9896735529) or [place] at [phone].  Manage your cholesterol  Maintain a desired weight  Control your diabetes  Keep your blood pressure  down   If you have any questions, please call the office at [phone].

## 2017-07-19 NOTE — Plan of Care (Signed)
  Problem: Education: Goal: Knowledge of prescribed regimen will improve Outcome: Adequate for Discharge   Problem: Activity: Goal: Ability to tolerate increased activity will improve Outcome: Adequate for Discharge   Problem: Bowel/Gastric: Goal: Gastrointestinal status for postoperative course will improve Outcome: Adequate for Discharge   Problem: Clinical Measurements: Goal: Postoperative complications will be avoided or minimized Outcome: Adequate for Discharge Goal: Signs and symptoms of graft occlusion will improve Outcome: Adequate for Discharge   Problem: Skin Integrity: Goal: Demonstration of wound healing without infection will improve Outcome: Adequate for Discharge   

## 2017-07-19 NOTE — Discharge Summary (Addendum)
Discharge Summary     Dean Ellison May 13, 1955 62 y.o. male  [id number]  Admission Date: 07/18/2017  Discharge Date: 07/19/17  Physician: Thomes Lolling*  Admission Diagnosis: right carotid stenosis   HPI:   This is a 63 y.o. male follows up with CT scan to evaluate his bilateral carotid artery disease.  At last demonstration we had high-grade stenosis of his right internal carotid artery with moderate stenosis of the left.  He has not had any stroke TIA or amaurosis.  He is on aspirin Plavix and statin drug.  He has been transitioned to Xarelto for sick sinus syndrome but has not seen his cardiologist about this.  He is also being placed on amiodarone at same time.   Hospital Course:  The patient was admitted to the hospital and taken to the operating room on 07/18/2017 and underwent Right trans-carotid artery stent with 9 x 40 mm en route stent using neuro protection with flow reversal   Findings: There is  an 80% stenosis in the proximal ICA disease down into the distal common carotid artery.  Following stenting there is 0% residual stenosis and flow briskly into the distal ICA.   The pt tolerated the procedure well and was transported to the PACU in good condition.   By POD 1, the pt neuro status was in tact.  He is continued on his Plavix and Aspirin.  His creatinine 1.54, which is stable from pre-op.  He is on Metformin.  Will ask him to hold taking this and f/u with his PCP next week to have his kidney function checked and re-evaluate metformin use.  Also discussed pt's BP medications with him.  He states that he normally runs 948'N-462'V systolic at home.  He is instructed to continue his home medications.  He does have a cuff at home to keep a check on his BP.  He is instructed if he is symptomatic, to stop his BP meds until it recovers.   The remainder of the hospital course consisted of increasing mobilization and increasing intake of solids without  difficulty.   Recent Labs    07/16/17 1518 07/19/17 0554  NA 137 137  K 4.2 4.3  CL 104 101  CO2 20* 25  GLUCOSE 181* 250*  BUN 19 23*  CALCIUM 9.2 9.0   Recent Labs    07/16/17 1518 07/19/17 0554  WBC 7.2 9.1  HGB 12.1* 10.3*  HCT 37.0* 31.1*  PLT 231 218   Recent Labs    07/16/17 1518  INR 0.96     Discharge Instructions    Discharge patient   Complete by:  As directed    Discharge disposition:  01-Home or Self Care   Discharge patient date:  07/19/2017      Discharge Diagnosis:  right carotid stenosis  Secondary Diagnosis: Patient Active Problem List   Diagnosis Date Noted  . Carotid artery stenosis, asymptomatic, right 07/18/2017   Past Medical History:  Diagnosis Date  . Anemia   . Arthritis   . CAD (coronary artery disease)   . Carotid artery occlusion   . COPD (chronic obstructive pulmonary disease) ([place])   . Diabetes mellitus without complication ([place])   . Dyspnea   . Gout   . Hyperlipidemia   . Hypertension   . OSA (obstructive sleep apnea)    does not use CPAP  . Persistent disorder of initiating or maintaining sleep   . Presence of permanent cardiac pacemaker   . Vitamin D deficiency  Allergies as of 07/19/2017      Reactions   Meperidine Other (See Comments)   Pt stated "it made me crazy"      Medication List    TAKE these medications   amiodarone 200 MG tablet Commonly known as:  PACERONE Take 200 mg by mouth daily.   aspirin EC 81 MG tablet Take 81 mg by mouth daily.   atenolol 50 MG tablet Commonly known as:  TENORMIN Take 50 mg by mouth 2 (two) times daily.   CHANTIX STARTING MONTH PAK 0.5 MG X 11 & 1 MG X 42 tablet Generic drug:  varenicline Take 0.5 mg by mouth 2 (two) times daily.   clopidogrel 75 MG tablet Commonly known as:  PLAVIX Take 75 mg by mouth daily.   Dulaglutide 1.5 MG/0.5ML Sopn Inject 1.5 mg into the skin every Tuesday.   famotidine 20 MG tablet Commonly known as:  PEPCID Take 20 mg by  mouth daily.   febuxostat 40 MG tablet Commonly known as:  ULORIC Take 40 mg by mouth daily.   ferrous sulfate 325 (65 FE) MG EC tablet Take 325 mg by mouth daily.   furosemide 20 MG tablet Commonly known as:  LASIX Take 20 mg by mouth daily.   latanoprost 0.005 % ophthalmic solution Commonly known as:  XALATAN Place 1 drop into both eyes at bedtime.   lisinopril 10 MG tablet Commonly known as:  PRINIVIL,ZESTRIL Take 5 mg by mouth daily.   metFORMIN 500 MG tablet Commonly known as:  GLUCOPHAGE Take 500 mg by mouth daily.   nitroGLYCERIN 0.4 MG SL tablet Commonly known as:  NITROSTAT Place 0.4 mg under the tongue every 5 (five) minutes as needed for chest pain.   oxyCODONE-acetaminophen 5-325 MG tablet Commonly known as:  PERCOCET Take 1 tablet by mouth every 6 (six) hours as needed for severe pain.   rosuvastatin 10 MG tablet Commonly known as:  CRESTOR Take 10 mg by mouth daily.   sitaGLIPtin 100 MG tablet Commonly known as:  JANUVIA Take 100 mg by mouth daily.   tiotropium 18 MCG inhalation capsule Commonly known as:  SPIRIVA Place 18 mcg into inhaler and inhale daily.   Vitamin D (Ergocalciferol) 2000 units Caps Take 2,000 Units by mouth 2 (two) times daily.        [facility] Discharge Instructions Carotid Endarterectomy (CEA)  Please refer to the following instructions for your post-procedure care. Your surgeon or physician assistant will discuss any changes with you.  Activity  You are encouraged to walk as much as you can. You can slowly return to normal activities but must avoid strenuous activity and heavy lifting until your doctor tell you it's OK. Avoid activities such as vacuuming or swinging a golf club. You can drive after one week if you are comfortable and you are no longer taking prescription pain medications. It is normal to feel tired for serval weeks after your surgery. It is also normal to have difficulty  with sleep habits, eating, and bowel movements after surgery. These will go away with time.  Bathing/Showering  You may shower after you come home. Do not soak in a bathtub, hot tub, or swim until the incision heals completely.  Incision Care  Shower every day. Clean your incision with mild soap and water. Pat the area dry with a clean towel. You do not need a bandage unless otherwise instructed. Do not apply any ointments or creams to your incision. You may  have skin glue on your incision. Do not peel it off. It will come off on its own in about one week. Your incision may feel thickened and raised for several weeks after your surgery. This is normal and the skin will soften over time. For Men Only: It's OK to shave around the incision but do not shave the incision itself for 2 weeks. It is common to have numbness under your chin that could last for several months.  Diet  Resume your normal diet. There are no special food restrictions following this procedure. A low fat/low cholesterol diet is recommended for all patients with vascular disease. In order to heal from your surgery, it is CRITICAL to get adequate nutrition. Your body requires vitamins, minerals, and protein. Vegetables are the best source of vitamins and minerals. Vegetables also provide the perfect balance of protein. Processed food has little nutritional value, so try to avoid this.  Medications  Resume taking all of your medications unless your doctor or physician assistant tells you not to.  If your incision is causing pain, you may take over-the- counter pain relievers such as acetaminophen (Tylenol). If you were prescribed a stronger pain medication, please be aware these medications can cause nausea and constipation.  Prevent nausea by taking the medication with a snack or meal. Avoid constipation by drinking plenty of fluids and eating foods with a high amount of fiber, such as fruits, vegetables, and grains.  Do not take  Tylenol if you are taking prescription pain medications.  Hold on taking Metformin until you have had your kidney function re-checked by your PCP. Follow Up  Our office will schedule a follow up appointment 2-3 weeks following discharge.  Please call us immediately for any of the following conditions  . Increased pain, redness, drainage (pus) from your incision site. . Fever of 101 degrees or higher. . If you should develop stroke (slurred speech, difficulty swallowing, weakness on one side of your body, loss of vision) you should call 911 and go to the nearest emergency room. .  Reduce your risk of vascular disease:  . Stop smoking. If you would like help call [facility] at 1-800-QUIT-NOW 737-504-6351) or [place] at [phone]. . Manage your cholesterol . Maintain a desired weight . Control your diabetes . Keep your blood pressure down .  If you have any questions, please call the office at [phone].  Prescriptions given: Roxicet #8 No Refill  Disposition: home  Patient's condition: is Good  Follow up: 1. Dr. Donzetta Matters in 4 weeks with carotid duplex  **discusded with pt his blood pressure.  He says his BP normally runs around 660-630 systolic on his current regimen.  Instructed him to continue his current regimen and take his blood pressure at home.  If it is low or he feels dizzy, to hold taking it until his blood pressure is higher.  He expressed understanding.  Leontine Locket, PA-C [facility] [phone]   --- For [facility] use ---   Modified Rankin score at D/C (0-6): 0  IV medication needed for:  1. Hypertension: No 2. Hypotension: No  Post-op Complications: No  1. Post-op CVA or TIA: No  If yes: Event classification (right eye, left eye, right cortical, left cortical, verterobasilar, other): n/a  If yes: Timing of event (intra-op, <6 hrs post-op, >=6 hrs post-op, unknown): n/a  2. CN injury: No  If yes: CN n/a injuried    3. Myocardial infarction: No  If yes: Dx by (EKG  or clinical, Troponin): n/a  4.  CHF: No  5.  Dysrhythmia (new): No  6. Wound infection: No  7. Reperfusion symptoms: No  8. Return to OR: No  If yes: return to OR for (bleeding, neurologic, other CEA incision, other): n/a  Discharge medications: Statin use:  Yes ASA use:  Yes   Beta blocker use:  Yes ACE-Inhibitor use:  Yes  ARB use:  No CCB use: No P2Y12 Antagonist use: Yes, [ x] Plavix, [ ]  Plasugrel, [ ]  Ticlopinine, [ ]  Ticagrelor, [ ]  Other, [ ]  No for medical reason, [ ]  Non-compliant, [ ]  Not-indicated Anti-coagulant use:  No, [ ]  Warfarin, [ ]  Rivaroxaban, [ ]  Dabigatran,

## 2017-07-19 NOTE — Progress Notes (Signed)
Discharge instructions reviewed with patient and son. Prescription given and all questions answered. Patient discharged unit via wheelchair with volunteer in stable condition to family vehicle.

## 2017-07-19 NOTE — Progress Notes (Addendum)
  Progress Note    07/19/2017 8:10 AM 1 Day Post-Op  Subjective:  No complaints; ready to go home.  Swallowing ok.  He has walked. Voiding ok.  Afebrile HR 60's-70's NSR 469'G-295'M systolic 84% RA  Vitals:   07/19/17 0343 07/19/17 0552  BP: (!) 106/44 109/61  Pulse: 70   Resp: 16 17  Temp: 98.4 F (36.9 C)   SpO2: 93%     Physical Exam: Cardiac:  regular Lungs:  Non labored Incisions:  Right neck incision is clean and dry; left groin is soft without hematoma Extremities:  Moving all extremities equally Neuro:  In tact; tongue midline; swallowing okay  CBC    Component Value Date/Time   WBC 9.1 07/19/2017 0554   RBC 3.50 (L) 07/19/2017 0554   HGB 10.3 (L) 07/19/2017 0554   HCT 31.1 (L) 07/19/2017 0554   PLT 218 07/19/2017 0554   MCV 88.9 07/19/2017 0554   MCH 29.4 07/19/2017 0554   MCHC 33.1 07/19/2017 0554   RDW 13.2 07/19/2017 0554    BMET    Component Value Date/Time   NA 137 07/19/2017 0554   K 4.3 07/19/2017 0554   CL 101 07/19/2017 0554   CO2 25 07/19/2017 0554   GLUCOSE 250 (H) 07/19/2017 0554   BUN 23 (H) 07/19/2017 0554   CREATININE 1.54 (H) 07/19/2017 0554   CALCIUM 9.0 07/19/2017 0554   GFRNONAA 47 (L) 07/19/2017 0554   GFRAA 54 (L) 07/19/2017 0554    INR    Component Value Date/Time   INR 0.96 07/16/2017 1518     Intake/Output Summary (Last 24 hours) at 07/19/2017 0810 Last data filed at 07/19/2017 0400 Gross per 24 hour  Intake 1961.67 ml  Output 1925 ml  Net 36.67 ml     Assessment:  62 y.o. male is s/p:  Right trans-carotid artery stent with 9 x 40 mmen route stent using neuro protection with flow reversal  1 Day Post-Op  Plan: -pt doing well this am and neuro in tact.  Incision looks fine and left groin is soft. -continue plavix/aspirin -discharge home today and f/u with Dr. Donzetta Matters in 4 weeks with carotid duplex. -creatinine 1.54, which is stable from pre-op.  He is on Metformin.  Will ask him to hold taking this and  f/u with his PCP next week to have his kidney function checked and re-evaluate metformin use.    Leontine Locket, PA-C [facility] [phone] 07/19/2017 8:10 AM   I have interviewed and examined patient with PA and agree with assessment and plan above.   Lariya Kinzie C. Donzetta Matters, MD [facility] [place] Office: [phone] Pager: [phone]

## 2017-07-24 ENCOUNTER — Other Ambulatory Visit: Payer: Self-pay

## 2017-07-24 DIAGNOSIS — I6523 Occlusion and stenosis of bilateral carotid arteries: Secondary | ICD-10-CM

## 2017-07-24 DIAGNOSIS — Z48812 Encounter for surgical aftercare following surgery on the circulatory system: Secondary | ICD-10-CM

## 2017-08-02 ENCOUNTER — Ambulatory Visit: Payer: Medicare HMO | Admitting: Vascular Surgery

## 2017-08-15 ENCOUNTER — Ambulatory Visit (HOSPITAL_COMMUNITY)
Admission: RE | Admit: 2017-08-15 | Discharge: 2017-08-15 | Disposition: A | Discharge status: Home / Self Care | Payer: Medicare HMO | Source: Ambulatory Visit | Attending: Vascular Surgery | Admitting: Vascular Surgery

## 2017-08-15 DIAGNOSIS — I6523 Occlusion and stenosis of bilateral carotid arteries: Secondary | ICD-10-CM

## 2017-08-15 DIAGNOSIS — Z48812 Encounter for surgical aftercare following surgery on the circulatory system: Secondary | ICD-10-CM | POA: Diagnosis not present

## 2017-08-16 ENCOUNTER — Ambulatory Visit (INDEPENDENT_AMBULATORY_CARE_PROVIDER_SITE_OTHER): Payer: Self-pay | Admitting: Vascular Surgery

## 2017-08-16 ENCOUNTER — Encounter: Payer: Self-pay | Admitting: Vascular Surgery

## 2017-08-16 VITALS — BP 124/68 | HR 70 | Resp 16 | Ht 70.0 in | Wt 183.0 lb

## 2017-08-16 DIAGNOSIS — I6523 Occlusion and stenosis of bilateral carotid arteries: Secondary | ICD-10-CM

## 2017-08-16 NOTE — Progress Notes (Signed)
  Subjective:     Patient ID: Dean Ellison, male   DOB: Apr 06, 1955, 62 y.o.   MRN: [id number]  HPI 62 year old male follows up after right sided trans-carotid artery stent.  This was done for asymptomatic disease.  He has healed well.  He has no complaints regarding today's visit.   Review of Systems  No complaints today   Objective:   Physical Exam Awake alert and oriented Right neck incision healing well Tongue is midline Voice has stable changes from preoperative but no new hoarseness Bilateral upper and lower extremities sensory motor intact    Assessment/plan       62 year old male follows up after right sided TCAR.  He is doing well.  He will follow-up in 6 months with repeat carotid duplex.  If there are issues between now and then I will certainly see him sooner.  He is to begin Xarelto for sick sinus syndrome and I would like him to be on Plavix as long as he can tolerate this along with Xarelto.  If he is to have a bleeding risk after 4 weeks of surgery he would be okay to be on Xarelto and aspirin.  I have asked him to continue aspirin and Plavix dual antiplatelet therapy until he begin Xarelto.  Essentially he will be on 2 of these 3 medicines for the foreseeable future and he does demonstrate good understanding of this.  Jeroline Wolbert C. Donzetta Matters, MD [facility] [place] Office: [phone] Pager: [phone]

## 2017-12-07 ENCOUNTER — Emergency Department (HOSPITAL_COMMUNITY)
Admission: EM | Admit: 2017-12-07 | Discharge: 2017-12-08 | Disposition: A | Discharge status: Home / Self Care | Payer: Medicare HMO | Attending: Emergency Medicine | Admitting: Emergency Medicine

## 2017-12-07 ENCOUNTER — Emergency Department (HOSPITAL_COMMUNITY): Payer: Medicare HMO

## 2017-12-07 ENCOUNTER — Other Ambulatory Visit: Payer: Self-pay

## 2017-12-07 DIAGNOSIS — I10 Essential (primary) hypertension: Secondary | ICD-10-CM | POA: Diagnosis not present

## 2017-12-07 DIAGNOSIS — Z7984 Long term (current) use of oral hypoglycemic drugs: Secondary | ICD-10-CM | POA: Diagnosis not present

## 2017-12-07 DIAGNOSIS — I2510 Atherosclerotic heart disease of native coronary artery without angina pectoris: Secondary | ICD-10-CM | POA: Insufficient documentation

## 2017-12-07 DIAGNOSIS — F17210 Nicotine dependence, cigarettes, uncomplicated: Secondary | ICD-10-CM | POA: Diagnosis not present

## 2017-12-07 DIAGNOSIS — E119 Type 2 diabetes mellitus without complications: Secondary | ICD-10-CM | POA: Diagnosis not present

## 2017-12-07 DIAGNOSIS — J449 Chronic obstructive pulmonary disease, unspecified: Secondary | ICD-10-CM | POA: Diagnosis not present

## 2017-12-07 DIAGNOSIS — Z79899 Other long term (current) drug therapy: Secondary | ICD-10-CM | POA: Diagnosis not present

## 2017-12-07 DIAGNOSIS — Z7982 Long term (current) use of aspirin: Secondary | ICD-10-CM | POA: Insufficient documentation

## 2017-12-07 DIAGNOSIS — Z951 Presence of aortocoronary bypass graft: Secondary | ICD-10-CM | POA: Insufficient documentation

## 2017-12-07 DIAGNOSIS — M25512 Pain in left shoulder: Secondary | ICD-10-CM | POA: Diagnosis not present

## 2017-12-07 DIAGNOSIS — Z7902 Long term (current) use of antithrombotics/antiplatelets: Secondary | ICD-10-CM | POA: Insufficient documentation

## 2017-12-07 LAB — BASIC METABOLIC PANEL
Anion gap: 12 (ref 5–15)
BUN: 22 mg/dL (ref 8–23)
CHLORIDE: 100 mmol/L (ref 98–111)
CO2: 25 mmol/L (ref 22–32)
Calcium: 9.6 mg/dL (ref 8.9–10.3)
Creatinine, Ser: 1.69 mg/dL — ABNORMAL HIGH (ref 0.61–1.24)
GFR calc non Af Amer: 42 mL/min — ABNORMAL LOW (ref >60)
GFR, EST AFRICAN AMERICAN: 49 mL/min — AB (ref >60)
Glucose, Bld: 205 mg/dL — ABNORMAL HIGH (ref 70–99)
POTASSIUM: 4.3 mmol/L (ref 3.5–5.1)
SODIUM: 137 mmol/L (ref 135–145)

## 2017-12-07 LAB — CBC
HEMATOCRIT: 38 % — AB (ref 39.0–52.0)
HEMOGLOBIN: 11.9 g/dL — AB (ref 13.0–17.0)
MCH: 29.4 pg (ref 26.0–34.0)
MCHC: 31.3 g/dL (ref 30.0–36.0)
MCV: 93.8 fL (ref 80.0–100.0)
NRBC: 0 % (ref 0.0–0.2)
Platelets: 257 10*3/uL (ref 150–400)
RBC: 4.05 MIL/uL — ABNORMAL LOW (ref 4.22–5.81)
RDW: 12.9 % (ref 11.5–15.5)
WBC: 6.8 10*3/uL (ref 4.0–10.5)

## 2017-12-07 LAB — I-STAT TROPONIN, ED: Troponin i, poc: 0 ng/mL (ref 0.00–0.08)

## 2017-12-07 NOTE — ED Triage Notes (Signed)
C/o 9/10 R arm and shoulder pain for several days, radiating to R chest. Stent placed in R neck in May

## 2017-12-08 MED ORDER — DEXAMETHASONE SODIUM PHOSPHATE 10 MG/ML IJ SOLN
5.0000 mg | Freq: Once | INTRAMUSCULAR | Status: AC
  Administered 2017-12-08: 5 mg via INTRAMUSCULAR
  Filled 2017-12-08: qty 1

## 2017-12-08 MED ORDER — CYCLOBENZAPRINE HCL 10 MG PO TABS
5.0000 mg | ORAL_TABLET | Freq: Once | ORAL | Status: AC
  Administered 2017-12-08: 5 mg via ORAL
  Filled 2017-12-08: qty 1

## 2017-12-08 MED ORDER — CYCLOBENZAPRINE HCL 5 MG PO TABS: 5.0000 mg | ORAL_TABLET | Freq: Three times a day (TID) | ORAL | 0 refills | Status: DC | PRN

## 2017-12-08 NOTE — Discharge Instructions (Addendum)
Use ice and heat for comfort. Take the flexeril for the pain. Call [facility] if not improving over the next couple of days to be evaluated. Return to the ED if you get a fever, redness of the skin or worsening pain.

## 2017-12-08 NOTE — ED Provider Notes (Signed)
[facility] [facility] EMERGENCY DEPARTMENT Provider Note   CSN: [id number] Arrival date & time: 12/07/17  1927  Time seen 01:05 AM   History   Chief Complaint Chief Complaint  Patient presents with  . Arm Pain  . Shoulder Pain    HPI Dean Ellison is a 62 y.o. male.  HPI patient states he got the shingles vaccine on September 30 and it was immediately painful and was painful for a long while.  He no longer has pain at the actual injection site but 5 days ago he was awakened from sleep with pain that is around his right shoulder blade that radiates into his right upper chest and down into his right arm to the level of his wrist.  He states the pain has been constant since it started but waxes and wanes.  He states moving his arm or turning his head to the right makes the pain worse.  He denies fever or coughing and states he has chronic intermittent shortness of breath.  He took Aleve without relief and then used Tiger balm which helped for short while.  He denies any other known injury. Right handed  PCP Secundino Ginger, PA-C [facility], [place]  Past Medical History:  Diagnosis Date  . Anemia   . Arthritis   . CAD (coronary artery disease)   . Carotid artery occlusion   . COPD (chronic obstructive pulmonary disease) ([place])   . Diabetes mellitus without complication ([place])   . Dyspnea   . Gout   . Hyperlipidemia   . Hypertension   . OSA (obstructive sleep apnea)    does not use CPAP  . Persistent disorder of initiating or maintaining sleep   . Presence of permanent cardiac pacemaker   . Vitamin D deficiency     Patient Active Problem List   Diagnosis Date Noted  . Carotid artery stenosis, asymptomatic, right 07/18/2017    Past Surgical History:  Procedure Laterality Date  . BACK SURGERY  1991  . CARDIAC CATHETERIZATION Right 2013  . CATARACT EXTRACTION Bilateral 1994  . CORONARY ARTERY BYPASS GRAFT  2013  . PACEMAKER INSERTION      . TRANSCAROTID ARTERY REVASCULARIZATION Right 07/18/2017   Procedure: TRANSCAROTID ARTERY REVASCULARIZATION;  Surgeon: Waynetta Sandy, MD;  Location: [facility];  Service: Vascular;  Laterality: Right;        Home Medications    Prior to Admission medications   Medication Sig Start Date End Date Taking? Authorizing Provider  amiodarone (PACERONE) 200 MG tablet Take 200 mg by mouth daily. 06/25/17   [provider]  aspirin EC 81 MG tablet Take 81 mg by mouth daily.    [provider]  atenolol (TENORMIN) 50 MG tablet Take 50 mg by mouth 2 (two) times daily.     [provider]  CHANTIX STARTING MONTH PAK 0.5 MG X 11 & 1 MG X 42 tablet Take 0.5 mg by mouth 2 (two) times daily.  05/23/17   [provider]  clopidogrel (PLAVIX) 75 MG tablet Take 75 mg by mouth daily.    [provider]  cyclobenzaprine (FLEXERIL) 5 MG tablet Take 1 tablet (5 mg total) by mouth 3 (three) times daily as needed (muscle soreness). 12/08/17   Rolland Porter, MD  Dulaglutide 1.5 MG/0.5ML SOPN Inject 1.5 mg into the skin every Tuesday.  10/07/15   [provider]  famotidine (PEPCID) 20 MG tablet Take 20 mg by mouth daily.  [provider]  febuxostat (ULORIC) 40 MG tablet Take 40 mg by mouth daily.    [provider]  ferrous sulfate 325 (65 FE) MG EC tablet Take 325 mg by mouth daily.    [provider]  furosemide (LASIX) 20 MG tablet Take 20 mg by mouth daily.     [provider]  latanoprost (XALATAN) 0.005 % ophthalmic solution Place 1 drop into both eyes at bedtime. 07/04/17   [provider]  lisinopril (PRINIVIL,ZESTRIL) 10 MG tablet Take 5 mg by mouth daily.    [provider]  metFORMIN (GLUCOPHAGE) 500 MG tablet Take 500 mg by mouth daily.    [provider]  nitroGLYCERIN (NITROSTAT) 0.4 MG SL tablet Place 0.4 mg under the tongue every 5 (five) minutes as needed for chest pain.     [provider]  rosuvastatin (CRESTOR) 10 MG tablet Take 10 mg by mouth daily.    [provider]  sitaGLIPtin (JANUVIA) 100 MG tablet Take 100 mg by mouth daily.    [provider]  tiotropium (SPIRIVA) 18 MCG inhalation capsule Place 18 mcg into inhaler and inhale daily.     [provider]  Vitamin D, Ergocalciferol, 2000 units CAPS Take 2,000 Units by mouth 2 (two) times daily.     [provider]    Family History Family History  Problem Relation Age of Onset  . Heart disease Mother   . Diabetes Mother   . Hypertension Mother   . Arthritis Mother   . Heart disease Father   . Diabetes Father   . Hypertension Father   . Arthritis Father     Social History Social History   Tobacco Use  . Smoking status: Current Every Day Smoker    Years: 40.00    Types: Cigarettes  . Smokeless tobacco: Never Used  . Tobacco comment: 7-8 cigarettes a day  Substance Use Topics  . Alcohol use: Yes    Alcohol/week: 1.0 standard drinks    Types: 1 Cans of beer per week    Comment: 1 every 6 months or more  . Drug use: No  lives with his oldest [place] 1/2 ppd   Allergies   Meperidine   Review of Systems Review of Systems  All other systems reviewed and are negative.    Physical Exam Updated Vital Signs BP 136/70   Pulse (!) 59   Temp 98.8 F (37.1 C) (Oral)   Resp 20   Ht 5\' 10"  (1.778 m)   Wt 83 kg   SpO2 94%   BMI 26.26 kg/m   Vital signs normal    Physical Exam  Constitutional: He appears well-developed and well-nourished. No distress.  HENT:  Head: Normocephalic and atraumatic.  Right Ear: External ear normal.  Left Ear: External ear normal.  Nose: Nose normal.  Mouth/Throat: Oropharynx is clear and moist.  Eyes: Pupils are equal, round, and reactive to light. Conjunctivae and EOM are normal.  Neck: Normal range of motion. Neck supple.  Nontender cervical spine to palpation.  Minimal tenderness over his right  trapezius.  Cardiovascular: Normal rate.  Pulmonary/Chest: Effort normal and breath sounds normal. No stridor. No respiratory distress. He has no wheezes. He has no rales. He exhibits tenderness.  Musculoskeletal: Normal range of motion. He exhibits tenderness. He exhibits no edema or deformity.  Patient is very tender around the superior medial aspect of his right scapula that reproduces a lot of his pain.  He is also  tender in the right subdeltoid bursa of his right shoulder.  He has pain with abduction and range of motion of his right upper arm in the right shoulder.  There is no obvious swelling felt.   Nursing note and vitals reviewed.    ED Treatments / Results  Labs (all labs ordered are listed, but only abnormal results are displayed) Results for orders placed or performed during the hospital encounter of 74/25/95  Basic metabolic panel  Result Value Ref Range   Sodium 137 135 - 145 mmol/L   Potassium 4.3 3.5 - 5.1 mmol/L   Chloride 100 98 - 111 mmol/L   CO2 25 22 - 32 mmol/L   Glucose, Bld 205 (H) 70 - 99 mg/dL   BUN 22 8 - 23 mg/dL   Creatinine, Ser 1.69 (H) 0.61 - 1.24 mg/dL   Calcium 9.6 8.9 - 10.3 mg/dL   GFR calc non Af Amer 42 (L) >60 mL/min   GFR calc Af Amer 49 (L) >60 mL/min   Anion gap 12 5 - 15  CBC  Result Value Ref Range   WBC 6.8 4.0 - 10.5 K/uL   RBC 4.05 (L) 4.22 - 5.81 MIL/uL   Hemoglobin 11.9 (L) 13.0 - 17.0 g/dL   HCT 38.0 (L) 39.0 - 52.0 %   MCV 93.8 80.0 - 100.0 fL   MCH 29.4 26.0 - 34.0 pg   MCHC 31.3 30.0 - 36.0 g/dL   RDW 12.9 11.5 - 15.5 %   Platelets 257 150 - 400 K/uL   nRBC 0.0 0.0 - 0.2 %  I-stat troponin, ED  Result Value Ref Range   Troponin i, poc 0.00 0.00 - 0.08 ng/mL   Comment 3           Laboratory interpretation all normal except stable chronic renal insufficiency, stable anemia from the renal insufficiency.    EKG EKG Interpretation  Date/Time:  Saturday December 07 2017 20:01:35 EDT Ventricular Rate:  61 PR  Interval:  180 QRS Duration: 66 QT Interval:  406 QTC Calculation: 408 R Axis:   75 Text Interpretation:  Atrial-paced rhythm Nonspecific T wave abnormality No old tracing to compare Confirmed by Rolland Porter [phone]) on 12/08/2017 12:13:46 AM   Radiology Dg Chest 2 View  Result Date: 12/07/2017 CLINICAL DATA:  Right-sided chest and arm pain for several days. Coronary artery disease. EXAM: CHEST - 2 VIEW COMPARISON:  None. FINDINGS: The heart size and mediastinal contours are within normal limits. Prior CABG. Transvenous pacemaker remains in appropriate position. Mild bibasilar scarring is stable. No evidence of pulmonary infiltrate or edema. No evidence of pleural effusion. The visualized skeletal structures are unremarkable. IMPRESSION: No active cardiopulmonary disease. Electronically Signed   By: Earle Gell M.D.   On: 12/07/2017 20:36    Procedures Procedures (including critical care time)  Medications Ordered in ED Medications  dexamethasone (DECADRON) injection 5 mg (5 mg Intramuscular Given 12/08/17 0123)  cyclobenzaprine (FLEXERIL) tablet 5 mg (5 mg Oral Given 12/08/17 0122)     Initial Impression / Assessment and Plan / ED Course  I have reviewed the triage vital signs and the nursing notes.  Pertinent labs & imaging results that were available during my care of the patient were reviewed by me and considered in my medical decision making (see chart for details).     We discussed his test results.  Patient is on Plavix and Xarelto so he cannot have a nonsteroidal anti-inflammatory drug.  He was given half dose of  Decadron IM and a muscle relaxer to see if that will help his pain.  He will be referred back to his orthopedic group for possible trigger point injection or whatever else they feel is indicated.  I suspect patient was very painful after the shingles vaccine and he had some guarding of his muscles that are now sore and inflamed.  Recheck 02:50 pain is better, feels  ready to be discharged. Will refer to [facility] for follow up.   Final Clinical Impressions(s) / ED Diagnoses   Final diagnoses:  Acute pain of left shoulder    ED Discharge Orders         Ordered    cyclobenzaprine (FLEXERIL) 5 MG tablet  3 times daily PRN     12/08/17 0259         Plan discharge  Rolland Porter, MD, Barbette Or, MD 12/08/17 [phone]

## 2018-01-27 ENCOUNTER — Other Ambulatory Visit: Payer: Self-pay

## 2018-01-27 DIAGNOSIS — I6523 Occlusion and stenosis of bilateral carotid arteries: Secondary | ICD-10-CM

## 2018-02-07 ENCOUNTER — Ambulatory Visit: Payer: Medicare HMO | Admitting: Vascular Surgery

## 2018-02-07 ENCOUNTER — Inpatient Hospital Stay (HOSPITAL_COMMUNITY): Admission: RE | Admit: 2018-02-07 | Payer: Medicare HMO | Source: Ambulatory Visit

## 2018-03-28 ENCOUNTER — Ambulatory Visit: Payer: Medicare HMO | Admitting: Vascular Surgery

## 2018-03-28 ENCOUNTER — Encounter (HOSPITAL_COMMUNITY): Payer: Medicare HMO

## 2018-05-02 ENCOUNTER — Ambulatory Visit: Payer: Medicare HMO | Admitting: Vascular Surgery

## 2018-05-02 ENCOUNTER — Encounter (HOSPITAL_COMMUNITY): Payer: Medicare HMO

## 2018-05-09 ENCOUNTER — Other Ambulatory Visit: Payer: Self-pay

## 2018-05-09 ENCOUNTER — Ambulatory Visit (HOSPITAL_COMMUNITY)
Admission: RE | Admit: 2018-05-09 | Discharge: 2018-05-09 | Disposition: A | Discharge status: Home / Self Care | Payer: Medicare HMO | Source: Ambulatory Visit | Attending: Vascular Surgery | Admitting: Vascular Surgery

## 2018-05-09 ENCOUNTER — Encounter: Payer: Self-pay | Admitting: Vascular Surgery

## 2018-05-09 ENCOUNTER — Ambulatory Visit (INDEPENDENT_AMBULATORY_CARE_PROVIDER_SITE_OTHER): Payer: Medicare HMO | Admitting: Vascular Surgery

## 2018-05-09 VITALS — BP 140/82 | HR 61 | Temp 97.6°F | Resp 16 | Ht 70.0 in | Wt 186.0 lb

## 2018-05-09 DIAGNOSIS — I6523 Occlusion and stenosis of bilateral carotid arteries: Secondary | ICD-10-CM

## 2018-05-09 DIAGNOSIS — Z48812 Encounter for surgical aftercare following surgery on the circulatory system: Secondary | ICD-10-CM | POA: Diagnosis not present

## 2018-05-09 NOTE — Progress Notes (Signed)
Patient ID: Dean Ellison, male   DOB: Jul 05, 1955, 63 y.o.   MRN: [id number]  Reason for Consult: Carotid (6 mth f/u Carotid. )   Referred by Secundino Ginger, PA-C  Subjective:     HPI:  Dean Ellison is a 63 y.o. male previous right carotid stent from trans-carotid route.  No history of stroke TIA amaurosis.  On Xarelto for sick sinus syndrome and aspirin no longer on Plavix.  Also take statin drug.  No chest pain.  Has been neurologically intact.  Was recently told he had decreased blood flow in his left lower extremity although he has no symptoms.  Overall he has been doing well.  Past Medical History:  Diagnosis Date  . Anemia   . Arthritis   . CAD (coronary artery disease)   . Carotid artery occlusion   . COPD (chronic obstructive pulmonary disease) ([place])   . Diabetes mellitus without complication ([place])   . Dyspnea   . Gout   . Hyperlipidemia   . Hypertension   . OSA (obstructive sleep apnea)    does not use CPAP  . Persistent disorder of initiating or maintaining sleep   . Presence of permanent cardiac pacemaker   . Vitamin D deficiency    Family History  Problem Relation Age of Onset  . Heart disease Mother   . Diabetes Mother   . Hypertension Mother   . Arthritis Mother   . Heart disease Father   . Diabetes Father   . Hypertension Father   . Arthritis Father    Past Surgical History:  Procedure Laterality Date  . BACK SURGERY  1991  . CARDIAC CATHETERIZATION Right 2013  . CATARACT EXTRACTION Bilateral 1994  . CORONARY ARTERY BYPASS GRAFT  2013  . PACEMAKER INSERTION    . TRANSCAROTID ARTERY REVASCULARIZATION Right 07/18/2017   Procedure: TRANSCAROTID ARTERY REVASCULARIZATION;  Surgeon: Waynetta Sandy, MD;  Location: [facility] OR;  Service: Vascular;  Laterality: Right;    Short Social History:  Social History   Tobacco Use  . Smoking status: Current Every Day Smoker    Years: 40.00    Types: Cigarettes  . Smokeless tobacco: Never Used  .  Tobacco comment: 7-8 cigarettes a day  Substance Use Topics  . Alcohol use: Yes    Alcohol/week: 1.0 standard drinks    Types: 1 Cans of beer per week    Comment: 1 every 6 months or more    Allergies  Allergen Reactions  . Meperidine Other (See Comments)    Demerol. Pt stated "it made me crazy"    Current Outpatient Medications  Medication Sig Dispense Refill  . aspirin EC 81 MG tablet Take 81 mg by mouth daily.    Marland Kitchen atenolol (TENORMIN) 50 MG tablet Take 50 mg by mouth 2 (two) times daily.     Marland Kitchen atorvastatin (LIPITOR) 20 MG tablet Take 20 mg by mouth daily.    . CHANTIX STARTING MONTH PAK 0.5 MG X 11 & 1 MG X 42 tablet Take 0.5 mg by mouth 2 (two) times daily.   0  . Dulaglutide 1.5 MG/0.5ML SOPN Inject 1.5 mg into the skin every Tuesday.     . famotidine (PEPCID) 20 MG tablet Take 20 mg by mouth daily.     . febuxostat (ULORIC) 40 MG tablet Take 40 mg by mouth daily.    . ferrous sulfate 325 (65 FE) MG EC tablet Take 325 mg by mouth daily.    . furosemide (  LASIX) 20 MG tablet Take 20 mg by mouth daily.     Marland Kitchen latanoprost (XALATAN) 0.005 % ophthalmic solution Place 1 drop into both eyes at bedtime.  5  . lisinopril (PRINIVIL,ZESTRIL) 10 MG tablet Take 20 mg by mouth daily.     . metFORMIN (GLUCOPHAGE) 500 MG tablet Take 500 mg by mouth daily.    . nitroGLYCERIN (NITROSTAT) 0.4 MG SL tablet Place 0.4 mg under the tongue every 5 (five) minutes as needed for chest pain.    . sitaGLIPtin (JANUVIA) 100 MG tablet Take 100 mg by mouth daily.    Marland Kitchen tiotropium (SPIRIVA) 18 MCG inhalation capsule Place 18 mcg into inhaler and inhale daily.     . Vitamin D, Ergocalciferol, 2000 units CAPS Take 2,000 Units by mouth 2 (two) times daily.     Alveda Reasons 2.5 MG TABS tablet 2.5 mg 2 (two) times daily.      No current facility-administered medications for this visit.     Review of Systems  Constitutional:  Constitutional negative. HENT: HENT negative.  Eyes: Eyes negative.  Respiratory:  Respiratory negative.  Cardiovascular: Cardiovascular negative.  GI: Gastrointestinal negative.  Musculoskeletal: Musculoskeletal negative.  Skin: Skin negative.  Neurological: Neurological negative. Hematologic: Hematologic/lymphatic negative.  Psychiatric: Psychiatric negative.        Objective:  Objective    Vitals:   05/09/18 0958 05/09/18 1000  BP: (!) 143/83 140/82  Pulse: 61   Resp: 16   Temp: 97.6 F (36.4 C)   SpO2: 95%     Physical Exam HENT:     Head: Normocephalic.  Neck:     Musculoskeletal: Normal range of motion.  Cardiovascular:     Rate and Rhythm: Normal rate.     Pulses:          Radial pulses are 2+ on the right side and 2+ on the left side.       Posterior tibial pulses are 2+ on the right side and 2+ on the left side.  Pulmonary:     Effort: Pulmonary effort is normal.  Abdominal:     General: Abdomen is flat.     Palpations: Abdomen is soft.  Skin:    General: Skin is warm and dry.     Capillary Refill: Capillary refill takes less than 2 seconds.  Neurological:     General: No focal deficit present.     Mental Status: He is alert and oriented to person, place, and time.  Psychiatric:        Mood and Affect: Mood normal.        Behavior: Behavior normal.        Thought Content: Thought content normal.        Judgment: Judgment normal.     Data:  Carotid duplex Summary: Right Carotid: Velocities in the right ICA are consistent with a 80-99%                stenosis. Non-hemodynamically significant plaque <50% noted in                the CCA. Area of increased velocity accompanied by vessel                narrowing and post-stneotic turbulence; however unable to                determine if this is within stent.  Left Carotid: Velocities in the left ICA are consistent with a 60-79% stenosis.  Non-hemodynamically significant plaque noted in the CCA. The ECA               appears <50% stenosed.  Vertebrals:  Bilateral  vertebral arteries demonstrate antegrade flow. Subclavians: Right subclavian artery was stenotic. Right subclavian artery flow              was disturbed. Normal flow hemodynamics were seen in the left              subclavian artery.  I independently interpreted his carotid duplex which demonstrates high-grade stenosis of his right internal carotid artery stent.      Assessment/Plan:     63 year old male with previous history of right trans-carotid artery stenting for high-grade asymptomatic stenosis.  Remains on Xarelto and aspirin.  Appears to have high-grade stenosis of his stent.  We will get CT angios and follow-up in 4 to 6 weeks for evaluation.  I discussed with him the signs and symptoms of stroke or mini stroke which she demonstrates good understanding and would present for emergent evaluation.  We will otherwise see him in 6 weeks.     Waynetta Sandy MD [facility]

## 2018-06-20 ENCOUNTER — Ambulatory Visit: Payer: Medicare HMO | Admitting: Vascular Surgery

## 2018-08-01 ENCOUNTER — Ambulatory Visit
Admission: RE | Admit: 2018-08-01 | Discharge: 2018-08-01 | Disposition: A | Discharge status: Home / Self Care | Payer: Medicare HMO | Source: Ambulatory Visit | Attending: Vascular Surgery | Admitting: Vascular Surgery

## 2018-08-01 ENCOUNTER — Other Ambulatory Visit: Payer: Self-pay

## 2018-08-01 DIAGNOSIS — I6523 Occlusion and stenosis of bilateral carotid arteries: Secondary | ICD-10-CM

## 2018-08-01 MED ORDER — IOPAMIDOL (ISOVUE-370) INJECTION 76%
75.0000 mL | Freq: Once | INTRAVENOUS | Status: AC | PRN
  Administered 2018-08-01: 75 mL via INTRAVENOUS

## 2018-08-07 ENCOUNTER — Telehealth: Payer: Self-pay | Admitting: *Deleted

## 2018-08-07 NOTE — Telephone Encounter (Signed)
Virtual Visit Pre-Appointment Phone Call  Today, I spoke with Dean Ellison and performed the following actions:  1. I explained that we are currently trying to limit exposure to the COVID-19 virus by seeing patients at home rather than in the office.  I explained that the visits are best done by video, but can be done by telephone.  I asked the patient if a virtual visit that the patient would like to try instead of coming into the office. Dean Ellison agreed to proceed with the virtual visit scheduled with Dr. Servando Snare on 08/08/18.          I confirmed the BEST phone number to call the day of the visit and- I included this in appointment notes.         If the patient said yes, I documented "VIDEO" in the appointment notes.   2. I confirmed consent by  a. sending through [place] or by email the [place] as written at the end of this message or  b. verbally as listed below. i. This visit is being performed in the setting of COVID-19. ii. All virtual visits are billed to your insurance company just like a normal visit would be.   iii. We'd like you to understand that the technology does not allow for your provider to perform an examination, and thus may limit your provider's ability to fully assess your condition.  iv. If your provider identifies any concerns that need to be evaluated in person, we will make arrangements to do so.   v. Finally, though the technology is pretty good, we cannot assure that it will always work on either your or our end, and in the setting of a video visit, we may have to convert it to a phone-only visit.  In either situation, we cannot ensure that we have a secure connection.   vi. Are you willing to proceed?"  STAFF: Did the patient verbally acknowledge consent to telehealth visit? Document YES/NO here: YES  2. I advised the patient to be prepared - I asked that the patient, on the day of his visit, record any  information possible with the equipment at his home, such as blood pressure, pulse, oxygen saturation, and your weight and write them all down. I asked the patient to have a pen and paper handy nearby the day of the visit as well.  3. If the patient was scheduled for a video visit, I informed the patient that the visit with the doctor would start with a text to the smartphone # given to [place] by the patient.         If the patient was scheduled for a telephone call, I informed the patient that the visit with the doctor would start with a call to the telephone # given to [place] by the patient.  4. I Informed patient they will receive a phone call 15 minutes prior to their appointment time from a [place] or nurse to review medications, allergies, etc. to prepare for the visit.    TELEPHONE CALL NOTE  Dean Ellison has been deemed a candidate for a follow-up tele-health visit to limit community exposure during the Covid-19 pandemic. I spoke with the patient via phone to ensure availability of phone/video source, confirm preferred email & phone number, and discuss instructions and expectations.  I reminded Dean Ellison to be prepared with any vital sign and/or heart rhythm information that could potentially be  obtained via home monitoring, at the time of his visit. I reminded Dean Ellison to expect a phone call prior to his visit.  Cleaster Corin, NT 08/07/2018 11:14 AM     FULL LENGTH CONSENT FOR TELE-HEALTH VISIT   I hereby voluntarily request, consent and authorize [facility] and its employed or contracted physicians, physician assistants, nurse practitioners or other licensed health care professionals (the Practitioner), to provide me with telemedicine health care services (the "Services") as deemed necessary by the treating Practitioner. I acknowledge and consent to receive the Services by the Practitioner via telemedicine. I understand that the telemedicine visit will involve communicating  with the Practitioner through live audiovisual communication technology and the disclosure of certain medical information by electronic transmission. I acknowledge that I have been given the opportunity to request an in-person assessment or other available alternative prior to the telemedicine visit and am voluntarily participating in the telemedicine visit.  I understand that I have the right to withhold or withdraw my consent to the use of telemedicine in the course of my care at any time, without affecting my right to future care or treatment, and that the Practitioner or I may terminate the telemedicine visit at any time. I understand that I have the right to inspect all information obtained and/or recorded in the course of the telemedicine visit and may receive copies of available information for a reasonable fee.  I understand that some of the potential risks of receiving the Services via telemedicine include:  Marland Kitchen Delay or interruption in medical evaluation due to technological equipment failure or disruption; . Information transmitted may not be sufficient (e.g. poor resolution of images) to allow for appropriate medical decision making by the Practitioner; and/or  . In rare instances, security protocols could fail, causing a breach of personal health information.  Furthermore, I acknowledge that it is my responsibility to provide information about my medical history, conditions and care that is complete and accurate to the best of my ability. I acknowledge that Practitioner's advice, recommendations, and/or decision may be based on factors not within their control, such as incomplete or inaccurate data provided by me or distortions of diagnostic images or specimens that may result from electronic transmissions. I understand that the practice of medicine is not an exact science and that Practitioner makes no warranties or guarantees regarding treatment outcomes. I acknowledge that I will receive a copy  of this consent concurrently upon execution via email to the email address I last provided but may also request a printed copy by calling the office of [facility].    I understand that my insurance will be billed for this visit.   I have read or had this consent read to me. . I understand the contents of this consent, which adequately explains the benefits and risks of the Services being provided via telemedicine.  . I have been provided ample opportunity to ask questions regarding this consent and the Services and have had my questions answered to my satisfaction. . I give my informed consent for the services to be provided through the use of telemedicine in my medical care  By participating in this telemedicine visit I agree to the above.

## 2018-08-08 ENCOUNTER — Encounter: Payer: Self-pay | Admitting: Vascular Surgery

## 2018-08-08 ENCOUNTER — Other Ambulatory Visit: Payer: Self-pay

## 2018-08-08 ENCOUNTER — Ambulatory Visit (INDEPENDENT_AMBULATORY_CARE_PROVIDER_SITE_OTHER): Payer: Medicare HMO | Admitting: Vascular Surgery

## 2018-08-08 VITALS — BP 124/74

## 2018-08-08 DIAGNOSIS — I6523 Occlusion and stenosis of bilateral carotid arteries: Secondary | ICD-10-CM | POA: Diagnosis not present

## 2018-08-08 NOTE — Progress Notes (Signed)
Virtual Visit via Video Note   I connected with Dean Ellison on 08/08/2018 using the [employer] virtual platform and verified that I was speaking with the correct person using two identifiers. Patient was located in a vehicle in the passenger seat with multiple family members surrounding him.  I am located in my office.   The limitations of evaluation and management by telemedicine and the availability of in person appointments have been previously discussed with the patient and are documented in the patients chart. The patient expressed understanding and consented to proceed.   PCP: Secundino Ginger, PA-C   History of Present Illness: Dean Ellison is a 63 y.o. male with previous history asymptomatic carotid stenosis.  He underwent trans-carotid artery stenting for high lesion.  Last follow-up he was noted to have high-grade stenosis in the stent by duplex.  He is now had CT angios for follow-up.  Does not have any stroke TIA or amaurosis symptoms.  Past Medical History:  Diagnosis Date  . Anemia   . Arthritis   . CAD (coronary artery disease)   . Carotid artery occlusion   . COPD (chronic obstructive pulmonary disease) ([place])   . Diabetes mellitus without complication ([place])   . Dyspnea   . Gout   . Hyperlipidemia   . Hypertension   . OSA (obstructive sleep apnea)    does not use CPAP  . Persistent disorder of initiating or maintaining sleep   . Presence of permanent cardiac pacemaker   . Vitamin D deficiency     Past Surgical History:  Procedure Laterality Date  . BACK SURGERY  1991  . CARDIAC CATHETERIZATION Right 2013  . CATARACT EXTRACTION Bilateral 1994  . CORONARY ARTERY BYPASS GRAFT  2013  . PACEMAKER INSERTION    . TRANSCAROTID ARTERY REVASCULARIZATION Right 07/18/2017   Procedure: TRANSCAROTID ARTERY REVASCULARIZATION;  Surgeon: Waynetta Sandy, MD;  Location: [facility] OR;  Service: Vascular;  Laterality: Right;    Current Meds  Medication Sig  .  aspirin EC 81 MG tablet Take 81 mg by mouth daily.  Marland Kitchen atenolol (TENORMIN) 50 MG tablet Take 50 mg by mouth 2 (two) times daily.   Marland Kitchen atorvastatin (LIPITOR) 20 MG tablet Take 20 mg by mouth daily.  . CHANTIX STARTING MONTH PAK 0.5 MG X 11 & 1 MG X 42 tablet Take 0.5 mg by mouth 2 (two) times daily.   . Dulaglutide 1.5 MG/0.5ML SOPN Inject 1.5 mg into the skin every Tuesday.   . famotidine (PEPCID) 20 MG tablet Take 20 mg by mouth daily.   . febuxostat (ULORIC) 40 MG tablet Take 40 mg by mouth daily.  . ferrous sulfate 325 (65 FE) MG EC tablet Take 325 mg by mouth daily.  . furosemide (LASIX) 20 MG tablet Take 20 mg by mouth daily.   Marland Kitchen latanoprost (XALATAN) 0.005 % ophthalmic solution Place 1 drop into both eyes at bedtime.  Marland Kitchen lisinopril (PRINIVIL,ZESTRIL) 10 MG tablet Take 20 mg by mouth daily.   . metFORMIN (GLUCOPHAGE) 500 MG tablet Take 500 mg by mouth daily.  . nitroGLYCERIN (NITROSTAT) 0.4 MG SL tablet Place 0.4 mg under the tongue every 5 (five) minutes as needed for chest pain.  . sitaGLIPtin (JANUVIA) 100 MG tablet Take 100 mg by mouth daily.  Marland Kitchen tiotropium (SPIRIVA) 18 MCG inhalation capsule Place 18 mcg into inhaler and inhale daily.   . Vitamin D, Ergocalciferol, 2000 units CAPS Take 2,000 Units by mouth 2 (two) times daily.   Marland Kitchen  XARELTO 2.5 MG TABS tablet 2.5 mg 2 (two) times daily.     12 system ROS was negative unless otherwise noted in HPI  Observations/Objective: Vitals:   08/08/18 1149  BP: 124/74   He is awake alert and oriented he is moving all of his extremities well and his cranial nerves appear intact  Assessment and Plan: 63 year old male previously underwent right trans-carotid artery stenting for asymptomatic high-grade stenosis that was noted to be high lesion.  He now has recurrent stenosis within the stent that appears high-grade by both duplex and CT.  Thankfully he remains asymptomatic for this.  He does take Xarelto for sick sinus syndrome has a pacemaker.  He  is also on aspirin and a statin at this time.  Given the location of the lesion he would not be a candidate for endarterectomy to remove the stent.  He is planned to have colonoscopy I will see him after this again is a virtual visit and can discuss repeat stenting versus plain balloon angioplasty either from a trans-femoral or trans-carotid approach.  I discussed with him the signs and symptoms of stroke TIA and amaurosis and if he has any of these he would need to be seen emergently.  He demonstrates good understanding of this I will get him set up for a visit in the next 4 to 6 weeks.  Follow Up Instructions:  Follow up 4 to 6 weeks to discuss surgery.   I discussed the assessment and treatment plan with the patient. The patient was provided an opportunity to ask questions and all were answered. The patient agreed with the plan and demonstrated an understanding of the instructions.   The patient was advised to call back or seek an in-person evaluation if the symptoms worsen or if the condition fails to improve as anticipated.  I spent approximately 12 minutes with this patient discussing CT scan findings as well as possible approaches for repair.  He demonstrated good understanding via video encounter.   Signed, Servando Snare [facility] Office: [phone]  08/08/2018, 11:56 AM

## 2018-08-18 ENCOUNTER — Other Ambulatory Visit: Payer: Self-pay

## 2018-08-18 ENCOUNTER — Encounter (HOSPITAL_COMMUNITY): Payer: Self-pay

## 2018-08-18 ENCOUNTER — Emergency Department (HOSPITAL_COMMUNITY)
Admission: EM | Admit: 2018-08-18 | Discharge: 2018-08-19 | Disposition: A | Discharge status: Home / Self Care | Payer: Medicare HMO | Attending: Emergency Medicine | Admitting: Emergency Medicine

## 2018-08-18 ENCOUNTER — Emergency Department (HOSPITAL_COMMUNITY): Payer: Medicare HMO

## 2018-08-18 DIAGNOSIS — I2510 Atherosclerotic heart disease of native coronary artery without angina pectoris: Secondary | ICD-10-CM | POA: Insufficient documentation

## 2018-08-18 DIAGNOSIS — T887XXA Unspecified adverse effect of drug or medicament, initial encounter: Secondary | ICD-10-CM | POA: Diagnosis not present

## 2018-08-18 DIAGNOSIS — Z7984 Long term (current) use of oral hypoglycemic drugs: Secondary | ICD-10-CM | POA: Insufficient documentation

## 2018-08-18 DIAGNOSIS — J449 Chronic obstructive pulmonary disease, unspecified: Secondary | ICD-10-CM | POA: Diagnosis not present

## 2018-08-18 DIAGNOSIS — E1165 Type 2 diabetes mellitus with hyperglycemia: Secondary | ICD-10-CM | POA: Diagnosis not present

## 2018-08-18 DIAGNOSIS — N289 Disorder of kidney and ureter, unspecified: Secondary | ICD-10-CM | POA: Diagnosis not present

## 2018-08-18 DIAGNOSIS — Z79899 Other long term (current) drug therapy: Secondary | ICD-10-CM | POA: Diagnosis not present

## 2018-08-18 DIAGNOSIS — R0789 Other chest pain: Secondary | ICD-10-CM | POA: Insufficient documentation

## 2018-08-18 DIAGNOSIS — R739 Hyperglycemia, unspecified: Secondary | ICD-10-CM

## 2018-08-18 DIAGNOSIS — Z7982 Long term (current) use of aspirin: Secondary | ICD-10-CM | POA: Diagnosis not present

## 2018-08-18 DIAGNOSIS — T50905A Adverse effect of unspecified drugs, medicaments and biological substances, initial encounter: Secondary | ICD-10-CM | POA: Diagnosis not present

## 2018-08-18 DIAGNOSIS — I10 Essential (primary) hypertension: Secondary | ICD-10-CM | POA: Diagnosis not present

## 2018-08-18 DIAGNOSIS — Z7901 Long term (current) use of anticoagulants: Secondary | ICD-10-CM | POA: Diagnosis not present

## 2018-08-18 DIAGNOSIS — Z950 Presence of cardiac pacemaker: Secondary | ICD-10-CM | POA: Diagnosis not present

## 2018-08-18 DIAGNOSIS — Z951 Presence of aortocoronary bypass graft: Secondary | ICD-10-CM | POA: Insufficient documentation

## 2018-08-18 DIAGNOSIS — Y658 Other specified misadventures during surgical and medical care: Secondary | ICD-10-CM | POA: Insufficient documentation

## 2018-08-18 DIAGNOSIS — E860 Dehydration: Secondary | ICD-10-CM | POA: Diagnosis not present

## 2018-08-18 DIAGNOSIS — F17210 Nicotine dependence, cigarettes, uncomplicated: Secondary | ICD-10-CM | POA: Insufficient documentation

## 2018-08-18 LAB — CBG MONITORING, ED: Glucose-Capillary: 334 mg/dL — ABNORMAL HIGH (ref 70–99)

## 2018-08-18 LAB — CBC
HCT: 40.5 % (ref 39.0–52.0)
Hemoglobin: 13.4 g/dL (ref 13.0–17.0)
MCH: 29.6 pg (ref 26.0–34.0)
MCHC: 33.1 g/dL (ref 30.0–36.0)
MCV: 89.6 fL (ref 80.0–100.0)
Platelets: 268 10*3/uL (ref 150–400)
RBC: 4.52 MIL/uL (ref 4.22–5.81)
RDW: 12.9 % (ref 11.5–15.5)
WBC: 7.8 10*3/uL (ref 4.0–10.5)
nRBC: 0 % (ref 0.0–0.2)

## 2018-08-18 LAB — URINALYSIS, ROUTINE W REFLEX MICROSCOPIC
Bacteria, UA: NONE SEEN
Bilirubin Urine: NEGATIVE
Glucose, UA: >=500 mg/dL — AB
Hgb urine dipstick: NEGATIVE
Ketones, ur: NEGATIVE mg/dL
Leukocytes,Ua: NEGATIVE
Nitrite: NEGATIVE
Protein, ur: 30 mg/dL — AB
Specific Gravity, Urine: 1.017 (ref 1.005–1.030)
pH: 5 (ref 5.0–8.0)

## 2018-08-18 MED ORDER — SODIUM CHLORIDE 0.9% FLUSH: 3.0000 mL | Freq: Once | INTRAVENOUS | Status: DC

## 2018-08-18 NOTE — ED Notes (Signed)
Please call son Oluwadamilola Rosamond @  [phone]--VKP a status update--Sandi Towe

## 2018-08-18 NOTE — ED Triage Notes (Signed)
Pt states that he has had recent changes in his diabetic medication and the past three days he has had a CBG over 500. Pt also reports that he also having been having CP that started today that radiates to his R arm with some dizziness

## 2018-08-19 ENCOUNTER — Other Ambulatory Visit: Payer: Self-pay

## 2018-08-19 LAB — BASIC METABOLIC PANEL
Anion gap: 18 — ABNORMAL HIGH (ref 5–15)
BUN: 40 mg/dL — ABNORMAL HIGH (ref 8–23)
CO2: 18 mmol/L — ABNORMAL LOW (ref 22–32)
Calcium: 9.5 mg/dL (ref 8.9–10.3)
Chloride: 96 mmol/L — ABNORMAL LOW (ref 98–111)
Creatinine, Ser: 2.17 mg/dL — ABNORMAL HIGH (ref 0.61–1.24)
GFR calc Af Amer: 36 mL/min — ABNORMAL LOW (ref >60)
GFR calc non Af Amer: 31 mL/min — ABNORMAL LOW (ref >60)
Glucose, Bld: 333 mg/dL — ABNORMAL HIGH (ref 70–99)
Potassium: 4.7 mmol/L (ref 3.5–5.1)
Sodium: 132 mmol/L — ABNORMAL LOW (ref 135–145)

## 2018-08-19 LAB — CBG MONITORING, ED: Glucose-Capillary: 207 mg/dL — ABNORMAL HIGH (ref 70–99)

## 2018-08-19 LAB — TROPONIN I
Troponin I: <0.03 ng/mL (ref <0.03)
Troponin I: <0.03 ng/mL (ref <0.03)

## 2018-08-19 MED ORDER — SODIUM CHLORIDE 0.9 % IV BOLUS
1000.0000 mL | Freq: Once | INTRAVENOUS | Status: AC
  Administered 2018-08-19: 1000 mL via INTRAVENOUS

## 2018-08-19 MED ORDER — LIDOCAINE VISCOUS HCL 2 % MT SOLN
15.0000 mL | Freq: Once | OROMUCOSAL | Status: AC
  Administered 2018-08-19: 15 mL via ORAL
  Filled 2018-08-19: qty 15

## 2018-08-19 MED ORDER — ALUM & MAG HYDROXIDE-SIMETH 200-200-20 MG/5ML PO SUSP
30.0000 mL | Freq: Once | ORAL | Status: AC
  Administered 2018-08-19: 30 mL via ORAL
  Filled 2018-08-19: qty 30

## 2018-08-19 MED ORDER — ACETAMINOPHEN 500 MG PO TABS
1000.0000 mg | ORAL_TABLET | Freq: Once | ORAL | Status: AC
  Administered 2018-08-19: 06:00:00 1000 mg via ORAL
  Filled 2018-08-19: qty 2

## 2018-08-19 NOTE — ED Notes (Signed)
Pt verbalized understanding of verbal and written discharge instructions provided. Pt taken in w/c to POV for d/c

## 2018-08-19 NOTE — ED Provider Notes (Signed)
[facility] EMERGENCY DEPARTMENT Provider Note   CSN: [id number] Arrival date & time: 08/18/18  2127     History   Chief Complaint Chief Complaint  Patient presents with   Hyperglycemia   Chest Pain    HPI Dean Ellison is a 63 y.o. male.     The history is provided by the patient.  Hyperglycemia Blood sugar level PTA:  500 Severity:  Moderate Onset quality:  Gradual Timing:  Constant Progression:  Unchanged Chronicity:  Chronic Diabetes status:  Controlled with insulin Context: change in medication   Context: not insulin pump use   Relieved by:  Nothing Ineffective treatments:  None tried Associated symptoms: chest pain   Associated symptoms: no abdominal pain, no diaphoresis, no dizziness, no fever, no increased appetite, no increased thirst, no shortness of breath, no syncope and no vomiting   Risk factors: obesity   Risk factors: no hx of DKA   Chest Pain Pain location:  L chest Pain quality: dull   Pain radiates to:  Does not radiate Pain severity:  Mild Onset quality:  Gradual Duration:  2 days Timing:  Constant Progression:  Waxing and waning Chronicity:  Recurrent Context: at rest   Context: not raising an arm, not stress and not trauma   Relieved by:  Nothing Worsened by:  Nothing Ineffective treatments:  None tried Associated symptoms: no abdominal pain, no anorexia, no back pain, no cough, no diaphoresis, no dizziness, no fever, no headache, no heartburn, no near-syncope, no numbness, no orthopnea, no palpitations, no shortness of breath, no syncope and no vomiting   Risk factors: male sex   Patient has had non radiating left sided chest pain and dehydration and hyperglycemia for the last several days after medication change.  No f/c/r.  No DOE no exertional pain.  No n/v/d.    Past Medical History:  Diagnosis Date   Anemia    Arthritis    CAD (coronary artery disease)    Carotid artery occlusion    COPD (chronic  obstructive pulmonary disease) ([facility])    Diabetes mellitus without complication ([facility])    Dyspnea    Gout    Hyperlipidemia    Hypertension    OSA (obstructive sleep apnea)    does not use CPAP   Persistent disorder of initiating or maintaining sleep    Presence of permanent cardiac pacemaker    Vitamin D deficiency     Patient Active Problem List   Diagnosis Date Noted   Carotid artery stenosis, asymptomatic, right 07/18/2017    Past Surgical History:  Procedure Laterality Date   BACK SURGERY  1991   CARDIAC CATHETERIZATION Right 2013   CATARACT EXTRACTION Bilateral 1994   CORONARY ARTERY BYPASS GRAFT  2013   PACEMAKER INSERTION     TRANSCAROTID ARTERY REVASCULARIZATION Right 07/18/2017   Procedure: TRANSCAROTID ARTERY REVASCULARIZATION;  Surgeon: Waynetta Sandy, MD;  Location: [facility];  Service: Vascular;  Laterality: Right;        Home Medications    Prior to Admission medications   Medication Sig Start Date End Date Taking? Authorizing Provider  aspirin EC 81 MG tablet Take 81 mg by mouth daily.    [provider]  atenolol (TENORMIN) 50 MG tablet Take 50 mg by mouth 2 (two) times daily.     [provider]  atorvastatin (LIPITOR) 20 MG tablet Take 20 mg by mouth daily. 04/06/18   [provider]  CHANTIX STARTING MONTH PAK 0.5 MG X  11 & 1 MG X 42 tablet Take 0.5 mg by mouth 2 (two) times daily.  05/23/17   [provider]  Dulaglutide 1.5 MG/0.5ML SOPN Inject 1.5 mg into the skin every Tuesday.  10/07/15   [provider]  famotidine (PEPCID) 20 MG tablet Take 20 mg by mouth daily.     [provider]  febuxostat (ULORIC) 40 MG tablet Take 40 mg by mouth daily.    [provider]  ferrous sulfate 325 (65 FE) MG EC tablet Take 325 mg by mouth daily.    [provider]  furosemide (LASIX) 20 MG tablet Take 20 mg by mouth daily.     [provider]  latanoprost (XALATAN)  0.005 % ophthalmic solution Place 1 drop into both eyes at bedtime. 07/04/17   [provider]  lisinopril (PRINIVIL,ZESTRIL) 10 MG tablet Take 20 mg by mouth daily.     [provider]  metFORMIN (GLUCOPHAGE) 500 MG tablet Take 500 mg by mouth daily.    [provider]  nitroGLYCERIN (NITROSTAT) 0.4 MG SL tablet Place 0.4 mg under the tongue every 5 (five) minutes as needed for chest pain.    [provider]  sitaGLIPtin (JANUVIA) 100 MG tablet Take 100 mg by mouth daily.    [provider]  tiotropium (SPIRIVA) 18 MCG inhalation capsule Place 18 mcg into inhaler and inhale daily.     [provider]  Vitamin D, Ergocalciferol, 2000 units CAPS Take 2,000 Units by mouth 2 (two) times daily.     [provider]  XARELTO 2.5 MG TABS tablet 2.5 mg 2 (two) times daily.  04/12/18   [provider]    Family History Family History  Problem Relation Age of Onset   Heart disease Mother    Diabetes Mother    Hypertension Mother    Arthritis Mother    Heart disease Father    Diabetes Father    Hypertension Father    Arthritis Father     Social History Social History   Tobacco Use   Smoking status: Current Every Day Smoker    Packs/day: 0.25    Years: 40.00    Pack years: 10.00    Types: Cigarettes   Smokeless tobacco: Never Used   Tobacco comment: 5 cigarettes a day  Substance Use Topics   Alcohol use: Yes    Alcohol/week: 1.0 standard drinks    Types: 1 Cans of beer per week    Comment: 1 every 6 months or more   Drug use: No     Allergies   Meperidine   Review of Systems Review of Systems  Constitutional: Negative for diaphoresis and fever.  Eyes: Negative for visual disturbance.  Respiratory: Negative for cough and shortness of breath.   Cardiovascular: Positive for chest pain. Negative for palpitations, orthopnea, syncope and near-syncope.  Gastrointestinal: Negative for abdominal pain,  anorexia, heartburn and vomiting.  Endocrine: Negative for heat intolerance, polydipsia and polyphagia.  Musculoskeletal: Negative for back pain.  Neurological: Negative for dizziness, numbness and headaches.  All other systems reviewed and are negative.    Physical Exam Updated Vital Signs BP (!) 141/89 (BP Location: Right Arm)    Pulse 74    Temp 98 F (36.7 C) (Oral)    Resp 12    Ht 5\' 10"  (1.778 m)    Wt 90.3 kg    SpO2 99%    BMI 28.55 kg/m   Physical Exam Vitals signs and nursing  note reviewed.  Constitutional:      General: He is not in acute distress.    Appearance: He is obese.  HENT:     Head: Normocephalic and atraumatic.     Nose: Nose normal.     Mouth/Throat:     Mouth: Mucous membranes are moist.     Pharynx: Oropharynx is clear.  Eyes:     Conjunctiva/sclera: Conjunctivae normal.     Pupils: Pupils are equal, round, and reactive to light.  Neck:     Musculoskeletal: Normal range of motion and neck supple.  Cardiovascular:     Rate and Rhythm: Normal rate and regular rhythm.     Pulses: Normal pulses.     Heart sounds: Normal heart sounds.  Pulmonary:     Effort: Pulmonary effort is normal.     Breath sounds: Normal breath sounds.  Abdominal:     General: Abdomen is flat. Bowel sounds are normal.     Tenderness: There is no abdominal tenderness. There is no guarding or rebound.  Musculoskeletal: Normal range of motion.  Skin:    General: Skin is warm and dry.     Capillary Refill: Capillary refill takes less than 2 seconds.  Neurological:     General: No focal deficit present.     Mental Status: He is alert and oriented to person, place, and time.  Psychiatric:        Mood and Affect: Mood normal.        Behavior: Behavior normal.      ED Treatments / Results  Labs (all labs ordered are listed, but only abnormal results are displayed) Results for orders placed or performed during the hospital encounter of 36/64/40  Basic metabolic panel    Result Value Ref Range   Sodium 132 (L) 135 - 145 mmol/L   Potassium 4.7 3.5 - 5.1 mmol/L   Chloride 96 (L) 98 - 111 mmol/L   CO2 18 (L) 22 - 32 mmol/L   Glucose, Bld 333 (H) 70 - 99 mg/dL   BUN 40 (H) 8 - 23 mg/dL   Creatinine, Ser 2.17 (H) 0.61 - 1.24 mg/dL   Calcium 9.5 8.9 - 10.3 mg/dL   GFR calc non Af Amer 31 (L) >60 mL/min   GFR calc Af Amer 36 (L) >60 mL/min   Anion gap 18 (H) 5 - 15  CBC  Result Value Ref Range   WBC 7.8 4.0 - 10.5 K/uL   RBC 4.52 4.22 - 5.81 MIL/uL   Hemoglobin 13.4 13.0 - 17.0 g/dL   HCT 40.5 39.0 - 52.0 %   MCV 89.6 80.0 - 100.0 fL   MCH 29.6 26.0 - 34.0 pg   MCHC 33.1 30.0 - 36.0 g/dL   RDW 12.9 11.5 - 15.5 %   Platelets 268 150 - 400 K/uL   nRBC 0.0 0.0 - 0.2 %  Troponin I - ONCE - STAT  Result Value Ref Range   Troponin I <0.03 <0.03 ng/mL  Urinalysis, Routine w reflex microscopic  Result Value Ref Range   Color, Urine STRAW (A) YELLOW   APPearance CLEAR CLEAR   Specific Gravity, Urine 1.017 1.005 - 1.030   pH 5.0 5.0 - 8.0   Glucose, UA >=500 (A) NEGATIVE mg/dL   Hgb urine dipstick NEGATIVE NEGATIVE   Bilirubin Urine NEGATIVE NEGATIVE   Ketones, ur NEGATIVE NEGATIVE mg/dL   Protein, ur 30 (A) NEGATIVE mg/dL   Nitrite NEGATIVE NEGATIVE   Leukocytes,Ua NEGATIVE NEGATIVE   RBC /  HPF 0-5 0 - 5 RBC/hpf   WBC, UA 0-5 0 - 5 WBC/hpf   Bacteria, UA NONE SEEN NONE SEEN   Squamous Epithelial / LPF 0-5 0 - 5  CBG monitoring, ED  Result Value Ref Range   Glucose-Capillary 334 (H) 70 - 99 mg/dL   Dg Chest 2 View  Result Date: 08/18/2018 CLINICAL DATA:  Initial evaluation for acute chest pain. EXAM: CHEST - 2 VIEW COMPARISON:  Prior radiograph from 12/07/2017 FINDINGS: Left-sided transvenous pacemaker/AICD in place. Median sternotomy wires underlying CABG markers noted. Mild cardiomegaly, stable. Mediastinal silhouette normal. Aortic atherosclerosis. Lungs hyperinflated with changes related to COPD. Mild linear left basilar subsegmental  atelectasis versus scarring. No focal infiltrates. No edema or effusion. No pneumothorax. No acute osseous finding. IMPRESSION: 1. Mild left basilar subsegmental atelectasis versus scarring. No other active cardiopulmonary disease. 2. COPD. 3. Aortic atherosclerosis. Electronically Signed   By: Jeannine Boga M.D.   On: 08/18/2018 23:06   Ct Angio Neck W Or Wo Contrast  Result Date: 08/01/2018 CLINICAL DATA:  63 year old male with known carotid stenosis with stent placement on 07/15/2018. EXAM: CT ANGIOGRAPHY NECK TECHNIQUE: Multidetector CT imaging of the neck was performed using the standard protocol during bolus administration of intravenous contrast. Multiplanar CT image reconstructions and MIPs were obtained to evaluate the vascular anatomy. Carotid stenosis measurements (when applicable) are obtained utilizing NASCET criteria, using the distal internal carotid diameter as the denominator. CONTRAST:  77mL ISOVUE-370 IOPAMIDOL (ISOVUE-370) INJECTION 76% COMPARISON:  CTA head and neck 06/21/2017. FINDINGS: Skeleton: Prior sternotomy. Chronic or congenital C2-C3 ankylosis. No acute osseous abnormality identified. Visualized paranasal sinuses and mastoids are stable and well pneumatized. Upper chest: Large lung volumes. Apically emphysema. No superior mediastinal lymphadenopathy. Prior CABG. Other neck: The glottis is closed. Negative visible neck soft tissues. Negative visible Aortic arch: Soft and calcified arch atherosclerosis. Three vessel arch configuration. Right carotid system: No brachiocephalic or right CCA origin stenosis despite plaque. Soft and calcified plaque in the lateral distal right CCA just proximal to a right carotid stent without stenosis. Patent right stent which extends from the distal CCA into the right ICA. There is in stent stenosis at the level of the proximal right ICA as suggested on coronal series 10, image 67 and series 7, image 90. Distal to the stent the right ICA is patent  to the skull base without additional stenosis. Left carotid system: Left CCA origin soft and calcified plaque redemonstrated with stenosis estimated at 50 % with respect to the distal vessel and stable since 2019 (series 7, image 30). There is distal left CCA soft plaque in the lateral wall without significant stenosis proximal to the left carotid bifurcation. At the bifurcation combine soft and calcified plaque is redemonstrated but resulting in stenosis of less than 50 % with respect to the distal vessel. Patent left ICA to the skull base. Vertebral arteries: No proximal right subclavian artery stenosis despite plaque. Moderate stenosis at the right vertebral artery origin due to soft and calcified plaque on series 8, image 90 appears stable. Additional right V1 segment plaque. The right vertebral artery is then patent to the vertebrobasilar junction without additional stenosis. Normal right PICA origin. Normal visible basilar artery. Proximal left subclavian artery soft and calcified plaque without significant stenosis. Mild to moderate plaque at the left vertebral artery origin on series 10, image 87 is stable. The left vertebral is mildly dominant and patent to the vertebrobasilar junction without additional stenosis. Normal left PICA origin. Review of the  MIP images confirms the above findings IMPRESSION: 1. Patent right carotid stent but significant appearing in-stent stenosis at the level of the proximal right ICA as seen on series 10, image 67. 2. Stable left carotid atherosclerosis without hemodynamically significant stenosis. 3. Stable moderate right Vertebral artery origin stenosis, mild left vertebral origin stenosis. 4. Aortic Atherosclerosis (ICD10-I70.0) and Emphysema (ICD10-J43.9). Electronically Signed   By: Genevie Ann M.D.   On: 08/01/2018 14:26    EKG    Date: 08/19/2018  Rate: 65  Rhythm: normal sinus rhythm  QRS Axis: normal  Intervals: normal  ST/T Wave abnormalities: non specific t  changes  Conduction Disutrbances: none  Narrative Interpretation: non specific t changes    Radiology Dg Chest 2 View  Result Date: 08/18/2018 CLINICAL DATA:  Initial evaluation for acute chest pain. EXAM: CHEST - 2 VIEW COMPARISON:  Prior radiograph from 12/07/2017 FINDINGS: Left-sided transvenous pacemaker/AICD in place. Median sternotomy wires underlying CABG markers noted. Mild cardiomegaly, stable. Mediastinal silhouette normal. Aortic atherosclerosis. Lungs hyperinflated with changes related to COPD. Mild linear left basilar subsegmental atelectasis versus scarring. No focal infiltrates. No edema or effusion. No pneumothorax. No acute osseous finding. IMPRESSION: 1. Mild left basilar subsegmental atelectasis versus scarring. No other active cardiopulmonary disease. 2. COPD. 3. Aortic atherosclerosis. Electronically Signed   By: Jeannine Boga M.D.   On: 08/18/2018 23:06    Procedures Procedures (including critical care time)  Medications Ordered in ED Medications  sodium chloride flush (NS) 0.9 % injection 3 mL (has no administration in time range)  sodium chloride 0.9 % bolus 1,000 mL (1,000 mLs Intravenous New Bag/Given 08/19/18 0542)  acetaminophen (TYLENOL) tablet 1,000 mg (1,000 mg Oral Given 08/19/18 0542)  alum & mag hydroxide-simeth (MAALOX/MYLANTA) 200-200-20 MG/5ML suspension 30 mL (30 mLs Oral Given 08/19/18 0542)    And  lidocaine (XYLOCAINE) 2 % viscous mouth solution 15 mL (15 mLs Oral Given 08/19/18 0542)      Ruled out for MI in the ED. Pain improved post medication.  Symptoms ongoing with 2 negative troponins I suspect GERD.  Rehydrated and sugar is improved.  Feeling improved.  Follow up with your PMD and [facility] for your renal insufficiency.  You will also need to discuss jardiance with your provider.    Final Clinical Impressions(s) / ED Diagnoses   Final diagnoses:  Renal insufficiency  Dehydration  Hyperglycemia    Return for intractable  cough, coughing up blood,fevers >100.4 unrelieved by medication, shortness of breath, intractable vomiting, chest pain, shortness of breath, weakness,numbness, changes in speech, facial asymmetry,abdominal pain, passing out,Inability to tolerate liquids or food, cough, altered mental status or any concerns. No signs of systemic illness or infection. The patient is nontoxic-appearing on exam and vital signs are within normal limits.   I have reviewed the triage vital signs and the nursing notes. Pertinent labs &imaging results that were available during my care of the patient were reviewed by me and considered in my medical decision making (see chart for details).  After history, exam, and medical workup I feel the patient has been appropriately medically screened and is safe for discharge home. Pertinent diagnoses were discussed with the patient. Patient was given return precautions   Camillo Quadros, MD 08/19/18 [phone]

## 2018-09-26 ENCOUNTER — Ambulatory Visit (INDEPENDENT_AMBULATORY_CARE_PROVIDER_SITE_OTHER): Payer: Medicare HMO | Admitting: Vascular Surgery

## 2018-09-26 ENCOUNTER — Encounter: Payer: Self-pay | Admitting: *Deleted

## 2018-09-26 ENCOUNTER — Encounter: Payer: Self-pay | Admitting: Vascular Surgery

## 2018-09-26 ENCOUNTER — Other Ambulatory Visit: Payer: Self-pay

## 2018-09-26 VITALS — BP 124/79 | Wt 199.0 lb

## 2018-09-26 DIAGNOSIS — I6523 Occlusion and stenosis of bilateral carotid arteries: Secondary | ICD-10-CM

## 2018-09-26 DIAGNOSIS — D649 Anemia, unspecified: Secondary | ICD-10-CM | POA: Insufficient documentation

## 2018-09-26 DIAGNOSIS — E785 Hyperlipidemia, unspecified: Secondary | ICD-10-CM | POA: Insufficient documentation

## 2018-09-26 DIAGNOSIS — I6529 Occlusion and stenosis of unspecified carotid artery: Secondary | ICD-10-CM | POA: Insufficient documentation

## 2018-09-26 DIAGNOSIS — M109 Gout, unspecified: Secondary | ICD-10-CM | POA: Insufficient documentation

## 2018-09-26 DIAGNOSIS — G4700 Insomnia, unspecified: Secondary | ICD-10-CM | POA: Insufficient documentation

## 2018-09-26 DIAGNOSIS — E119 Type 2 diabetes mellitus without complications: Secondary | ICD-10-CM | POA: Insufficient documentation

## 2018-09-26 DIAGNOSIS — I10 Essential (primary) hypertension: Secondary | ICD-10-CM | POA: Insufficient documentation

## 2018-09-26 DIAGNOSIS — G4733 Obstructive sleep apnea (adult) (pediatric): Secondary | ICD-10-CM | POA: Insufficient documentation

## 2018-09-26 DIAGNOSIS — Z950 Presence of cardiac pacemaker: Secondary | ICD-10-CM | POA: Insufficient documentation

## 2018-09-26 DIAGNOSIS — M1990 Unspecified osteoarthritis, unspecified site: Secondary | ICD-10-CM | POA: Insufficient documentation

## 2018-09-26 DIAGNOSIS — E559 Vitamin D deficiency, unspecified: Secondary | ICD-10-CM | POA: Insufficient documentation

## 2018-09-26 DIAGNOSIS — I2510 Atherosclerotic heart disease of native coronary artery without angina pectoris: Secondary | ICD-10-CM | POA: Insufficient documentation

## 2018-09-26 DIAGNOSIS — J449 Chronic obstructive pulmonary disease, unspecified: Secondary | ICD-10-CM | POA: Insufficient documentation

## 2018-09-26 NOTE — Progress Notes (Signed)
Virtual Visit via Video Note   I connected with [place] on 09/26/2018 using the [employer] virtual platform and verified that I was speaking with the correct person using two identifiers. Patient was located in his daughter-in-law's car with his 3 grandchildren I am in the office.   The limitations of evaluation and management by telemedicine and the availability of in person appointments have been previously discussed with the patient and are documented in the patients chart. The patient expressed understanding and consented to proceed.   PCP: Secundino Ginger, PA-C  Chief Complaint: Discuss possible surgery  History of Present Illness: Dean Ellison is a 63 y.o. male with previously underwent trans-carotid artery stenting on the right for high-grade asymptomatic stenosis.  On follow-up he was noted to have no further symptoms was taking aspirin as well as Xarelto for sick sinus syndrome and also taking statin.  Was found to have high-grade stenosis of his stent by duplex subsequently underwent CT scan.  CT scan confirms stenosis.  We are now discussing possible repeat stenting.  Past Medical History:  Diagnosis Date  . Anemia   . Arthritis   . CAD (coronary artery disease)   . Carotid artery occlusion   . COPD (chronic obstructive pulmonary disease) ([place])   . Diabetes mellitus without complication ([employer])   . Dyspnea   . Gout   . Hyperlipidemia   . Hypertension   . OSA (obstructive sleep apnea)    does not use CPAP  . Persistent disorder of initiating or maintaining sleep   . Presence of permanent cardiac pacemaker   . Vitamin D deficiency     Past Surgical History:  Procedure Laterality Date  . BACK SURGERY  1991  . CARDIAC CATHETERIZATION Right 2013  . CATARACT EXTRACTION Bilateral 1994  . CORONARY ARTERY BYPASS GRAFT  2013  . PACEMAKER INSERTION    . TRANSCAROTID ARTERY REVASCULARIZATION Right 07/18/2017   Procedure: TRANSCAROTID ARTERY REVASCULARIZATION;   Surgeon: Waynetta Sandy, MD;  Location: [facility] OR;  Service: Vascular;  Laterality: Right;    Current Meds  Medication Sig  . aspirin EC 81 MG tablet Take 81 mg by mouth daily.  Marland Kitchen atenolol (TENORMIN) 50 MG tablet Take 50 mg by mouth 2 (two) times daily.   Marland Kitchen atorvastatin (LIPITOR) 20 MG tablet Take 20 mg by mouth daily.  . CHANTIX STARTING MONTH PAK 0.5 MG X 11 & 1 MG X 42 tablet Take 0.5 mg by mouth 2 (two) times daily.   . Dulaglutide 1.5 MG/0.5ML SOPN Inject 1.5 mg into the skin every Tuesday.   . Empagliflozin (JARDIANCE PO) Take by mouth.  . famotidine (PEPCID) 20 MG tablet Take 20 mg by mouth daily.   . febuxostat (ULORIC) 40 MG tablet Take 40 mg by mouth daily.  . ferrous sulfate 325 (65 FE) MG EC tablet Take 325 mg by mouth daily.  . furosemide (LASIX) 20 MG tablet Take 20 mg by mouth daily.   Marland Kitchen glipiZIDE (GLUCOTROL) 5 MG tablet Take 5 mg by mouth daily.  Marland Kitchen latanoprost (XALATAN) 0.005 % ophthalmic solution Place 1 drop into both eyes at bedtime.  Marland Kitchen lisinopril (PRINIVIL,ZESTRIL) 10 MG tablet Take 20 mg by mouth daily.   . nitroGLYCERIN (NITROSTAT) 0.4 MG SL tablet Place 0.4 mg under the tongue every 5 (five) minutes as needed for chest pain.  . sitaGLIPtin (JANUVIA) 100 MG tablet Take 100 mg by mouth daily.  Marland Kitchen tiotropium (SPIRIVA) 18 MCG inhalation capsule Place 18 mcg  into inhaler and inhale daily.   . Vitamin D, Ergocalciferol, 2000 units CAPS Take 2,000 Units by mouth 2 (two) times daily.   Alveda Reasons 2.5 MG TABS tablet 2.5 mg 2 (two) times daily.     12 system ROS was negative unless otherwise noted in HPI  Observations/Objective: He is awake and alert forming sentences well without any neurologic deficit  Assessment and Plan: 63 year old male is previous undergone trans-carotid artery stenting on the right for asymptomatic high-grade stenosis.  This was not heavily calcified and at completion of the procedure appeared to have 0% residual stenosis.  He now has  high-grade stenosis by duplex and confirmed on CT.  We will plan for repeat trans-carotid artery stenting.  I have discussed risk benefits alternatives with him and he demonstrates good understanding.  He is on Xarelto for sick sinus syndrome this will need to be held 2 days prior to procedure.  We will have him on triple therapy with Xarelto aspirin Plavix and he is already taking a statin.  Last time we transition him off of Plavix after 30 days.  Given his recurrent stenosis likely would transition him to Xarelto and Plavix long-term after this procedure.  I discussed was with him as well.  We will get everything situated from a medicine standpoint and get him scheduled in the very near future.  I discussed the assessment and treatment plan with the patient. The patient was provided an opportunity to ask questions and all were answered. The patient agreed with the plan and demonstrated an understanding of the instructions.   The patient was advised to call back or seek an in-person evaluation if the symptoms worsen or if the condition fails to improve as anticipated.  I spent 8 minutes with the patient via video encounter.   Signed, Servando Snare [facility] Office: [phone]  09/26/2018, 12:00 PM

## 2018-09-26 NOTE — H&P (View-Only) (Signed)
Virtual Visit via Video Note   I connected with Dean Ellison on 09/26/2018 using the Doxy[employer]me virtual platform and verified that I was speaking with the correct person using two identifiers. Patient was located in his daughter-in-law's car with his 3 grandchildren I am in the office.   The limitations of evaluation and management by telemedicine and the availability of in person appointments have been previously discussed with the patient and are documented in the patients chart. The patient expressed understanding and consented to proceed.   PCP: Secundino Ginger, PA-C  Chief Complaint: Discuss possible surgery  History of Present Illness: Dean Ellison is a 63 y.o. male with previously underwent trans-carotid artery stenting on the right for high-grade asymptomatic stenosis.  On follow-up he was noted to have no further symptoms was taking aspirin as well as Xarelto for sick sinus syndrome and also taking statin.  Was found to have high-grade stenosis of his stent by duplex subsequently underwent CT scan.  CT scan confirms stenosis.  We are now discussing possible repeat stenting.  Past Medical History:  Diagnosis Date  . Anemia   . Arthritis   . CAD (coronary artery disease)   . Carotid artery occlusion   . COPD (chronic obstructive pulmonary disease) ([place])   . Diabetes mellitus without complication ([place])   . Dyspnea   . Gout   . Hyperlipidemia   . Hypertension   . OSA (obstructive sleep apnea)    does not use CPAP  . Persistent disorder of initiating or maintaining sleep   . Presence of permanent cardiac pacemaker   . Vitamin D deficiency     Past Surgical History:  Procedure Laterality Date  . BACK SURGERY  1991  . CARDIAC CATHETERIZATION Right 2013  . CATARACT EXTRACTION Bilateral 1994  . CORONARY ARTERY BYPASS GRAFT  2013  . PACEMAKER INSERTION    . TRANSCAROTID ARTERY REVASCULARIZATION Right 07/18/2017   Procedure: TRANSCAROTID ARTERY REVASCULARIZATION;   Surgeon: Waynetta Sandy, MD;  Location: [facility] OR;  Service: Vascular;  Laterality: Right;    Current Meds  Medication Sig  . aspirin EC 81 MG tablet Take 81 mg by mouth daily.  Marland Kitchen atenolol (TENORMIN) 50 MG tablet Take 50 mg by mouth 2 (two) times daily.   Marland Kitchen atorvastatin (LIPITOR) 20 MG tablet Take 20 mg by mouth daily.  . CHANTIX STARTING MONTH PAK 0.5 MG X 11 & 1 MG X 42 tablet Take 0.5 mg by mouth 2 (two) times daily.   . Dulaglutide 1.5 MG/0.5ML SOPN Inject 1.5 mg into the skin every Tuesday.   . Empagliflozin (JARDIANCE PO) Take by mouth.  . famotidine (PEPCID) 20 MG tablet Take 20 mg by mouth daily.   . febuxostat (ULORIC) 40 MG tablet Take 40 mg by mouth daily.  . ferrous sulfate 325 (65 FE) MG EC tablet Take 325 mg by mouth daily.  . furosemide (LASIX) 20 MG tablet Take 20 mg by mouth daily.   Marland Kitchen glipiZIDE (GLUCOTROL) 5 MG tablet Take 5 mg by mouth daily.  Marland Kitchen latanoprost (XALATAN) 0.005 % ophthalmic solution Place 1 drop into both eyes at bedtime.  Marland Kitchen lisinopril (PRINIVIL,ZESTRIL) 10 MG tablet Take 20 mg by mouth daily.   . nitroGLYCERIN (NITROSTAT) 0.4 MG SL tablet Place 0.4 mg under the tongue every 5 (five) minutes as needed for chest pain.  . sitaGLIPtin (JANUVIA) 100 MG tablet Take 100 mg by mouth daily.  Marland Kitchen tiotropium (SPIRIVA) 18 MCG inhalation capsule Place 18 mcg  into inhaler and inhale daily.   . Vitamin D, Ergocalciferol, 2000 units CAPS Take 2,000 Units by mouth 2 (two) times daily.   Alveda Reasons 2.5 MG TABS tablet 2.5 mg 2 (two) times daily.     12 system ROS was negative unless otherwise noted in HPI  Observations/Objective: He is awake and alert forming sentences well without any neurologic deficit  Assessment and Plan: 63 year old male is previous undergone trans-carotid artery stenting on the right for asymptomatic high-grade stenosis.  This was not heavily calcified and at completion of the procedure appeared to have 0% residual stenosis.  He now has  high-grade stenosis by duplex and confirmed on CT.  We will plan for repeat trans-carotid artery stenting.  I have discussed risk benefits alternatives with him and he demonstrates good understanding.  He is on Xarelto for sick sinus syndrome this will need to be held 2 days prior to procedure.  We will have him on triple therapy with Xarelto aspirin Plavix and he is already taking a statin.  Last time we transition him off of Plavix after 30 days.  Given his recurrent stenosis likely would transition him to Xarelto and Plavix long-term after this procedure.  I discussed was with him as well.  We will get everything situated from a medicine standpoint and get him scheduled in the very near future.  I discussed the assessment and treatment plan with the patient. The patient was provided an opportunity to ask questions and all were answered. The patient agreed with the plan and demonstrated an understanding of the instructions.   The patient was advised to call back or seek an in-person evaluation if the symptoms worsen or if the condition fails to improve as anticipated.  I spent 8 minutes with the patient via video encounter.   Signed, Servando Snare [facility] [place] Office: [phone]  09/26/2018, 12:00 PM

## 2018-10-03 ENCOUNTER — Telehealth: Payer: Self-pay | Admitting: *Deleted

## 2018-10-03 ENCOUNTER — Other Ambulatory Visit: Payer: Self-pay | Admitting: *Deleted

## 2018-10-03 NOTE — Telephone Encounter (Signed)
Call to patient with pre-op instructions for 10/22/2018. Instructed to be at [facility] admitting office at 5:30 am. NPO past MN night prior and to expect a phone call and follow the detailed surgery, medication and Covid swab instructions from the hospital pre-admission department. Hold Xarelto for 48 hours prior to surgery. Continue and take am of surgery, ASA, Lipitor, and Plavix per [facility] protocol. Had patient write all instructions down, verbalized understanding and to call this office if questions.

## 2018-10-17 NOTE — Progress Notes (Signed)
[place], [place] - [address] [place] [address] Phone: [phone] Fax: [phone]  [facility] - [place], [place] [address] Phone: [phone] Fax: [phone]      Your procedure is scheduled on August 26th.  Report to [facility] Main Entrance "A" at 5:30 A.M., and check in at the Admitting office.  Call this number if you have problems the morning of surgery:  [phone]  Call [phone] if you have any questions prior to your surgery date Monday-Friday 8am-4pm    Remember:  Do not eat or drink after midnight the night before your surgery     Take these medicines the morning of surgery with A SIP OF WATER   Atenolol   Atorvastatin (lipitor)  Eye drops - if needed  Nitroglycerin - if needed  Spiriva - if needed  Follow your surgeon's instructions on when to stop Aspirin, Xarelto, & Plavix. If no instructions were given by your surgeon then you will need to call the office to get those instructions.     7 days prior to surgery STOP taking any Aleve, Naproxen, Ibuprofen, Motrin, Advil, Goody's, BC's, all herbal medications, fish oil, and all vitamins.   WHAT DO I DO ABOUT MY DIABETES MEDICATION?   Marland Kitchen Do not take oral diabetes medicines (pills) the morning of surgery - Jardiance, Glipizide (Glucotrol), Januvia (Sitagliptin)  . THE NIGHT BEFORE SURGERY, DO NOT take Glipizide, Jardiance       How to Manage Your Diabetes Before and After Surgery  Why is it important to control my blood sugar before and after surgery? . Improving blood sugar levels before and after surgery helps healing and can limit problems. . A way of improving blood sugar control is eating a healthy diet by: o  Eating less sugar and carbohydrates o  Increasing activity/exercise o  Talking with your doctor about reaching your blood sugar goals . High blood sugars (greater  than 180 mg/dL) can raise your risk of infections and slow your recovery, so you will need to focus on controlling your diabetes during the weeks before surgery. . Make sure that the doctor who takes care of your diabetes knows about your planned surgery including the date and location.  How do I manage my blood sugar before surgery? . Check your blood sugar at least 4 times a day, starting 2 days before surgery, to make sure that the level is not too high or low. o Check your blood sugar the morning of your surgery when you wake up and every 2 hours until you get to the Short Stay unit. . If your blood sugar is less than 70 mg/dL, you will need to treat for low blood sugar: o Do not take insulin. o Treat a low blood sugar (less than 70 mg/dL) with  cup of clear juice (cranberry or apple), 4 glucose tablets, OR glucose gel. o Recheck blood sugar in 15 minutes after treatment (to make sure it is greater than 70 mg/dL). If your blood sugar is not greater than 70 mg/dL on recheck, call [phone] for further instructions. . Report your blood sugar to the short stay nurse when you get to Short Stay.  . If you are admitted to the hospital after surgery: o Your blood sugar will be checked by the staff and you will probably be given insulin after surgery (instead of oral diabetes medicines) to  make sure you have good blood sugar levels. o The goal for blood sugar control after surgery is 80-180 mg/dL.    The Morning of Surgery  Do not wear jewelry, make-up or nail polish.  Do not wear lotions, powders, colognes, or deodorant  Men may shave face and neck.  Do not bring valuables to the hospital.  [facility] is not responsible for any belongings or valuables.  If you are a smoker, DO NOT Smoke 24 hours prior to surgery IF you wear a CPAP at night please bring your mask, tubing, and machine the morning of surgery   Remember that you must have someone to transport you home after your surgery,  and remain with you for 24 hours if you are discharged the same day.   Contacts, glasses, hearing aids, dentures or bridgework may not be worn into surgery.    Leave your suitcase in the car.  After surgery it may be brought to your room.  For patients admitted to the hospital, discharge time will be determined by your treatment team.  Patients discharged the day of surgery will not be allowed to drive home.    Special instructions:   [facility]- Preparing For Surgery  Before surgery, you can play an important role. Because skin is not sterile, your skin needs to be as free of germs as possible. You can reduce the number of germs on your skin by washing with CHG (chlorahexidine gluconate) Soap before surgery.  CHG is an antiseptic cleaner which kills germs and bonds with the skin to continue killing germs even after washing.    Oral Hygiene is also important to reduce your risk of infection.  Remember - BRUSH YOUR TEETH THE MORNING OF SURGERY WITH YOUR REGULAR TOOTHPASTE  Please do not use if you have an allergy to CHG or antibacterial soaps. If your skin becomes reddened/irritated stop using the CHG.  Do not shave (including legs and underarms) for at least 48 hours prior to first CHG shower. It is OK to shave your face.  Please follow these instructions carefully.   1. Shower the NIGHT BEFORE SURGERY and the MORNING OF SURGERY with CHG Soap.   2. If you chose to wash your hair, wash your hair first as usual with your normal shampoo.  3. After you shampoo, rinse your hair and body thoroughly to remove the shampoo.  4. Use CHG as you would any other liquid soap. You can apply CHG directly to the skin and wash gently with a scrungie or a clean washcloth.   5. Apply the CHG Soap to your body ONLY FROM THE NECK DOWN.  Do not use on open wounds or open sores. Avoid contact with your eyes, ears, mouth and genitals (private parts). Wash Face and genitals (private parts)  with your normal  soap.   6. Wash thoroughly, paying special attention to the area where your surgery will be performed.  7. Thoroughly rinse your body with warm water from the neck down.  8. DO NOT shower/wash with your normal soap after using and rinsing off the CHG Soap.  9. Pat yourself dry with a CLEAN TOWEL.  10. Wear CLEAN PAJAMAS to bed the night before surgery, wear comfortable clothes the morning of surgery  11. Place CLEAN SHEETS on your bed the night of your first shower and DO NOT SLEEP WITH PETS.    Day of Surgery:  Do not apply any deodorants/lotions. Please shower the morning of surgery with the CHG  soap  Please wear clean clothes to the hospital/surgery center.   Remember to brush your teeth WITH YOUR REGULAR TOOTHPASTE.   Please read over the following fact sheets that you were given.

## 2018-10-20 ENCOUNTER — Encounter (HOSPITAL_COMMUNITY)
Admission: RE | Admit: 2018-10-20 | Discharge: 2018-10-20 | Disposition: A | Discharge status: Home / Self Care | Payer: Medicare HMO | Source: Ambulatory Visit | Attending: Vascular Surgery | Admitting: Vascular Surgery

## 2018-10-20 ENCOUNTER — Other Ambulatory Visit (HOSPITAL_COMMUNITY)
Admission: RE | Admit: 2018-10-20 | Discharge: 2018-10-20 | Disposition: A | Discharge status: Home / Self Care | Payer: Medicare HMO | Source: Ambulatory Visit | Attending: Vascular Surgery | Admitting: Vascular Surgery

## 2018-10-20 ENCOUNTER — Encounter (HOSPITAL_COMMUNITY): Payer: Self-pay

## 2018-10-20 ENCOUNTER — Other Ambulatory Visit: Payer: Self-pay

## 2018-10-20 DIAGNOSIS — Z20828 Contact with and (suspected) exposure to other viral communicable diseases: Secondary | ICD-10-CM | POA: Insufficient documentation

## 2018-10-20 DIAGNOSIS — Z01812 Encounter for preprocedural laboratory examination: Secondary | ICD-10-CM | POA: Insufficient documentation

## 2018-10-20 LAB — CBC
HCT: 41.2 % (ref 39.0–52.0)
Hemoglobin: 13.2 g/dL (ref 13.0–17.0)
MCH: 30.3 pg (ref 26.0–34.0)
MCHC: 32 g/dL (ref 30.0–36.0)
MCV: 94.5 fL (ref 80.0–100.0)
Platelets: 239 10*3/uL (ref 150–400)
RBC: 4.36 MIL/uL (ref 4.22–5.81)
RDW: 12.6 % (ref 11.5–15.5)
WBC: 7.4 10*3/uL (ref 4.0–10.5)
nRBC: 0 % (ref 0.0–0.2)

## 2018-10-20 LAB — URINALYSIS, ROUTINE W REFLEX MICROSCOPIC
Bacteria, UA: NONE SEEN
Bilirubin Urine: NEGATIVE
Glucose, UA: >=500 mg/dL — AB
Hgb urine dipstick: NEGATIVE
Ketones, ur: NEGATIVE mg/dL
Leukocytes,Ua: NEGATIVE
Nitrite: NEGATIVE
Protein, ur: NEGATIVE mg/dL
Specific Gravity, Urine: 1.011 (ref 1.005–1.030)
pH: 5 (ref 5.0–8.0)

## 2018-10-20 LAB — TYPE AND SCREEN
ABO/RH(D): O POS
Antibody Screen: NEGATIVE

## 2018-10-20 LAB — APTT: aPTT: 33 seconds (ref 24–36)

## 2018-10-20 LAB — BLOOD GAS, ARTERIAL
Acid-Base Excess: 2.3 mmol/L — ABNORMAL HIGH (ref 0.0–2.0)
Bicarbonate: 26.6 mmol/L (ref 20.0–28.0)
Drawn by: 265211
FIO2: 21
O2 Saturation: 94.5 %
Patient temperature: 98.6
pCO2 arterial: 43.1 mmHg (ref 32.0–48.0)
pH, Arterial: 7.406 (ref 7.350–7.450)
pO2, Arterial: 71.8 mmHg — ABNORMAL LOW (ref 83.0–108.0)

## 2018-10-20 LAB — COMPREHENSIVE METABOLIC PANEL
ALT: 20 U/L (ref 0–44)
AST: 21 U/L (ref 15–41)
Albumin: 3.9 g/dL (ref 3.5–5.0)
Alkaline Phosphatase: 68 U/L (ref 38–126)
Anion gap: 14 (ref 5–15)
BUN: 23 mg/dL (ref 8–23)
CO2: 21 mmol/L — ABNORMAL LOW (ref 22–32)
Calcium: 9.4 mg/dL (ref 8.9–10.3)
Chloride: 102 mmol/L (ref 98–111)
Creatinine, Ser: 1.71 mg/dL — ABNORMAL HIGH (ref 0.61–1.24)
GFR calc Af Amer: 49 mL/min — ABNORMAL LOW (ref >60)
GFR calc non Af Amer: 42 mL/min — ABNORMAL LOW (ref >60)
Glucose, Bld: 289 mg/dL — ABNORMAL HIGH (ref 70–99)
Potassium: 4.3 mmol/L (ref 3.5–5.1)
Sodium: 137 mmol/L (ref 135–145)
Total Bilirubin: 0.5 mg/dL (ref 0.3–1.2)
Total Protein: 7.1 g/dL (ref 6.5–8.1)

## 2018-10-20 LAB — PROTIME-INR
INR: 1.3 — ABNORMAL HIGH (ref 0.8–1.2)
Prothrombin Time: 15.9 seconds — ABNORMAL HIGH (ref 11.4–15.2)

## 2018-10-20 LAB — SURGICAL PCR SCREEN
MRSA, PCR: NEGATIVE
Staphylococcus aureus: POSITIVE — AB

## 2018-10-20 LAB — HEMOGLOBIN A1C
Hgb A1c MFr Bld: 9.2 % — ABNORMAL HIGH (ref 4.8–5.6)
Mean Plasma Glucose: 217.34 mg/dL

## 2018-10-20 LAB — GLUCOSE, CAPILLARY: Glucose-Capillary: 275 mg/dL — ABNORMAL HIGH (ref 70–99)

## 2018-10-20 LAB — SARS CORONAVIRUS 2 (TAT 6-24 HRS): SARS Coronavirus 2: NEGATIVE

## 2018-10-20 NOTE — Progress Notes (Addendum)
PCP - Isaias Cowman, PA-C Cardiologist - Dr. Verdell Face  Chest x-ray - 08/18/2018 EKG - 08/19/2018 in EPIC  Stress Test - n/a ECHO - 10/19/2016 requested from [facility]  Cardiac Cath - 2013   Sleep Study - yes CPAP - n/a  Fasting Blood Sugar - 121-160 Checks Blood Sugar 6 times a day  Blood Thinner Instructions: Plavix continue and take dos Xarelto- hold for 48 hours prior to procedure Aspirin Instructions: Aspirin continue and take dos  Anesthesia review: Yes-heart hx, patient has Medtronic pacemaker.  Pacemaker orders faxed to [facility].  Requested ECHO from [facility].Cardiology.  A1C 9.2 (managed by Isaias Cowman PA-C) and creatinine 1.71  Patient denies shortness of breath, fever, cough and chest pain at PAT appointment  Patient verbalized understanding of instructions that were given to them at the PAT appointment. Patient was also instructed that they will need to review over the PAT instructions again at home before surgery.   Coronavirus Screening  Have you experienced the following symptoms:  Cough yes/no: No Fever (>100.46F)  yes/no: No Runny nose yes/no: No Sore throat yes/no: No Difficulty breathing/shortness of breath  yes/no: No  Have you or a family member traveled in the last 14 days and where? yes/no: No   If the patient indicates "YES" to the above questions, their PAT will be rescheduled to limit the exposure to others and, the surgeon will be notified. THE PATIENT WILL NEED TO BE ASYMPTOMATIC FOR 14 DAYS.   If the patient is not experiencing any of these symptoms, the PAT nurse will instruct them to NOT bring anyone with them to their appointment since they may have these symptoms or traveled as well.   Please remind your patients and families that hospital visitation restrictions are in effect and the importance of the restrictions.

## 2018-10-20 NOTE — Progress Notes (Signed)
Surgical PCR positive for staph, mupirocin ointment called into [facility] in [place], [place].  Called patient and informed him of results and that his prescription has been called into [facility] in [place].  He verbalized understanding

## 2018-10-21 NOTE — Anesthesia Preprocedure Evaluation (Addendum)
Anesthesia Evaluation  Patient identified by MRN, date of birth, ID band Patient awake    Reviewed: Allergy & Precautions, NPO status , Patient's Chart, lab work & pertinent test results, reviewed documented beta blocker date and time   History of Anesthesia Complications Negative for: history of anesthetic complications  Airway Mallampati: II  TM Distance: >3 FB Neck ROM: Full    Dental  (+) Dental Advisory Given   Pulmonary sleep apnea (does not use CPAP) , COPD,  COPD inhaler, Current Smoker and Patient abstained from smoking.,  10/20/2018 SARS coronavirus NEG   breath sounds clear to auscultation       Cardiovascular hypertension, Pt. on medications and Pt. on home beta blockers (-) angina+ CAD and + CABG  + pacemaker (for SSS: cards rec magnet intra-op)  Rhythm:Regular Rate:Normal  '18 ECHO: EF >55%, tr TR, tr MR   Neuro/Psych negative neurological ROS     GI/Hepatic Neg liver ROS, GERD  Medicated and Controlled,  Endo/Other  diabetes (glu 171), Oral Hypoglycemic Agents  Renal/GU Renal InsufficiencyRenal disease (creat 1.71)     Musculoskeletal   Abdominal (+) - obese,   Peds  Hematology xarelto   Anesthesia Other Findings   Reproductive/Obstetrics                           Anesthesia Physical Anesthesia Plan  ASA: III  Anesthesia Plan: General   Post-op Pain Management:    Induction: Intravenous  PONV Risk Score and Plan: 1 and Ondansetron and Dexamethasone  Airway Management Planned: Oral ETT  Additional Equipment: Arterial line  Intra-op Plan:   Post-operative Plan: Extubation in OR  Informed Consent: I have reviewed the patients History and Physical, chart, labs and discussed the procedure including the risks, benefits and alternatives for the proposed anesthesia with the patient or authorized representative who has indicated his/her understanding and acceptance.        Plan Discussed with: CRNA and Surgeon  Anesthesia Plan Comments: (Follows with cardiology Dr. Gala Lewandowsky at [facility] for hx of CAD (s/p CABG 2013, SSS (s/p Medtronic [id number] Advisa DR MRI PPM 12/09/14). Last seen by Dr. Gerarda Gunther 04/09/18. Doing well overall, BP was noted to be running high, lisinopril was increased. Device form has been faxed to Dr. Alfredo Batty office.    He was evaluated at ED 08/19/18 for chest pain and hyperglycemia. EKG was NSR with no ischemic changes. Troponin negative. MI ruled out. Symptoms felt related to GERD.   Per surgery posting he will stop xarelto 48 hrs prior to surgery but continue plavix, ASA, and lipitor.  Echo 12/17/16 ([facility]):  Summary: A complete 2D TTE was preformed. The study was technically difficult.LVEF was grossly normal. EF > 55%. There is a pacemaker lead in the RV. Trace MR. Trace TR. RVSP is normal. Mild aortic sclerosis without stenosis. AV is trileaflet.   Nuclear stress test 10/04/15 [facility]): Impressions:  The resting ECG shows normal sinus rhythm. The resting and stress ECG shows normal ST segment; and no ventricular tachycardia, significant QRS, prolongation or heart block. Both the rest and stress images are within normal limits.  No significant reversible ischemia or fixed scar. Gated left ventricular ejection fraction is normal at 80%.  Normal LV segmental wall motion.  )      Anesthesia Quick Evaluation

## 2018-10-22 ENCOUNTER — Inpatient Hospital Stay (HOSPITAL_COMMUNITY): Payer: Medicare HMO

## 2018-10-22 ENCOUNTER — Inpatient Hospital Stay (HOSPITAL_COMMUNITY): Payer: Medicare HMO | Admitting: Physician Assistant

## 2018-10-22 ENCOUNTER — Inpatient Hospital Stay (HOSPITAL_COMMUNITY)
Admission: RE | Admit: 2018-10-22 | Discharge: 2018-10-23 | DRG: 254 | Disposition: A | Discharge status: Home / Self Care | Payer: Medicare HMO | Attending: Vascular Surgery | Admitting: Vascular Surgery

## 2018-10-22 ENCOUNTER — Inpatient Hospital Stay (HOSPITAL_COMMUNITY): Payer: Medicare HMO | Admitting: Anesthesiology

## 2018-10-22 ENCOUNTER — Encounter (HOSPITAL_COMMUNITY): Payer: Self-pay | Admitting: *Deleted

## 2018-10-22 ENCOUNTER — Other Ambulatory Visit: Payer: Self-pay

## 2018-10-22 ENCOUNTER — Encounter (HOSPITAL_COMMUNITY)
Admission: RE | Disposition: A | Discharge status: Home / Self Care | Payer: Self-pay | Source: Home / Self Care | Attending: Vascular Surgery

## 2018-10-22 DIAGNOSIS — Z7982 Long term (current) use of aspirin: Secondary | ICD-10-CM

## 2018-10-22 DIAGNOSIS — M1990 Unspecified osteoarthritis, unspecified site: Secondary | ICD-10-CM | POA: Diagnosis present

## 2018-10-22 DIAGNOSIS — M109 Gout, unspecified: Secondary | ICD-10-CM | POA: Diagnosis present

## 2018-10-22 DIAGNOSIS — I2510 Atherosclerotic heart disease of native coronary artery without angina pectoris: Secondary | ICD-10-CM | POA: Diagnosis present

## 2018-10-22 DIAGNOSIS — Z7901 Long term (current) use of anticoagulants: Secondary | ICD-10-CM

## 2018-10-22 DIAGNOSIS — I6521 Occlusion and stenosis of right carotid artery: Secondary | ICD-10-CM | POA: Diagnosis present

## 2018-10-22 DIAGNOSIS — E785 Hyperlipidemia, unspecified: Secondary | ICD-10-CM | POA: Diagnosis present

## 2018-10-22 DIAGNOSIS — Z951 Presence of aortocoronary bypass graft: Secondary | ICD-10-CM

## 2018-10-22 DIAGNOSIS — Z7984 Long term (current) use of oral hypoglycemic drugs: Secondary | ICD-10-CM

## 2018-10-22 DIAGNOSIS — I495 Sick sinus syndrome: Secondary | ICD-10-CM | POA: Diagnosis present

## 2018-10-22 DIAGNOSIS — Z20828 Contact with and (suspected) exposure to other viral communicable diseases: Secondary | ICD-10-CM | POA: Diagnosis present

## 2018-10-22 DIAGNOSIS — Z950 Presence of cardiac pacemaker: Secondary | ICD-10-CM

## 2018-10-22 DIAGNOSIS — T82856A Stenosis of peripheral vascular stent, initial encounter: Principal | ICD-10-CM | POA: Diagnosis present

## 2018-10-22 DIAGNOSIS — J449 Chronic obstructive pulmonary disease, unspecified: Secondary | ICD-10-CM | POA: Diagnosis present

## 2018-10-22 DIAGNOSIS — I10 Essential (primary) hypertension: Secondary | ICD-10-CM | POA: Diagnosis present

## 2018-10-22 DIAGNOSIS — E119 Type 2 diabetes mellitus without complications: Secondary | ICD-10-CM | POA: Diagnosis present

## 2018-10-22 DIAGNOSIS — G4733 Obstructive sleep apnea (adult) (pediatric): Secondary | ICD-10-CM | POA: Diagnosis present

## 2018-10-22 HISTORY — PX: TRANSCAROTID ARTERY REVASCULARIZATION (TCAR): SHX6784

## 2018-10-22 HISTORY — PX: TRANSCAROTID ARTERY REVASCULARIZATIONÂ: SHX6778

## 2018-10-22 LAB — PROTIME-INR
INR: 1.1 (ref 0.8–1.2)
Prothrombin Time: 14 seconds (ref 11.4–15.2)

## 2018-10-22 LAB — GLUCOSE, CAPILLARY
Glucose-Capillary: 171 mg/dL — ABNORMAL HIGH (ref 70–99)
Glucose-Capillary: 169 mg/dL — ABNORMAL HIGH (ref 70–99)
Glucose-Capillary: 152 mg/dL — ABNORMAL HIGH (ref 70–99)
Glucose-Capillary: 363 mg/dL — ABNORMAL HIGH (ref 70–99)

## 2018-10-22 LAB — POCT ACTIVATED CLOTTING TIME: Activated Clotting Time: 296 seconds

## 2018-10-22 SURGERY — TRANSCAROTID ARTERY REVASCULARIZATION (TCAR)
Anesthesia: General | Site: Neck | Laterality: Right

## 2018-10-22 MED ORDER — SODIUM CHLORIDE 0.9 % IV SOLN
INTRAVENOUS | Status: DC
  Administered 2018-10-22 – 2018-10-23 (×2): via INTRAVENOUS

## 2018-10-22 MED ORDER — FENTANYL CITRATE (PF) 100 MCG/2ML IJ SOLN: 25.0000 ug | INTRAMUSCULAR | Status: DC | PRN

## 2018-10-22 MED ORDER — DEXAMETHASONE SODIUM PHOSPHATE 10 MG/ML IJ SOLN
INTRAMUSCULAR | Status: DC | PRN
  Administered 2018-10-22: 5 mg via INTRAVENOUS

## 2018-10-22 MED ORDER — TIOTROPIUM BROMIDE MONOHYDRATE 18 MCG IN CAPS: 18.0000 ug | ORAL_CAPSULE | Freq: Every day | RESPIRATORY_TRACT | Status: DC

## 2018-10-22 MED ORDER — FUROSEMIDE 20 MG PO TABS
20.0000 mg | ORAL_TABLET | Freq: Every day | ORAL | Status: DC
  Administered 2018-10-23: 10:00:00 20 mg via ORAL
  Filled 2018-10-22: qty 1

## 2018-10-22 MED ORDER — VARENICLINE TARTRATE 1 MG PO TABS
1.0000 mg | ORAL_TABLET | Freq: Two times a day (BID) | ORAL | Status: DC
  Administered 2018-10-22 – 2018-10-23 (×2): 1 mg via ORAL
  Filled 2018-10-22 (×4): qty 1

## 2018-10-22 MED ORDER — INSULIN ASPART 100 UNIT/ML ~~LOC~~ SOLN
0.0000 [IU] | Freq: Three times a day (TID) | SUBCUTANEOUS | Status: DC
  Administered 2018-10-22: 18:00:00 3 [IU] via SUBCUTANEOUS
  Administered 2018-10-23: 12:00:00 5 [IU] via SUBCUTANEOUS
  Administered 2018-10-23: 07:00:00 3 [IU] via SUBCUTANEOUS

## 2018-10-22 MED ORDER — FAMOTIDINE 20 MG PO TABS
20.0000 mg | ORAL_TABLET | Freq: Every day | ORAL | Status: DC
  Administered 2018-10-23: 10:00:00 20 mg via ORAL
  Filled 2018-10-22: qty 1

## 2018-10-22 MED ORDER — GLYCOPYRROLATE PF 0.2 MG/ML IJ SOSY
PREFILLED_SYRINGE | INTRAMUSCULAR | Status: AC
  Filled 2018-10-22: qty 1

## 2018-10-22 MED ORDER — LACTATED RINGERS IV SOLN
INTRAVENOUS | Status: DC
  Administered 2018-10-22: 07:00:00 via INTRAVENOUS

## 2018-10-22 MED ORDER — ONDANSETRON HCL 4 MG/2ML IJ SOLN
INTRAMUSCULAR | Status: DC | PRN
  Administered 2018-10-22: 4 mg via INTRAVENOUS

## 2018-10-22 MED ORDER — LIDOCAINE 2% (20 MG/ML) 5 ML SYRINGE
INTRAMUSCULAR | Status: DC | PRN
  Administered 2018-10-22: 20 mg via INTRAVENOUS

## 2018-10-22 MED ORDER — ACETAMINOPHEN 325 MG PO TABS: 325.0000 mg | ORAL_TABLET | ORAL | Status: DC | PRN

## 2018-10-22 MED ORDER — LIDOCAINE HCL (PF) 1 % IJ SOLN
INTRAMUSCULAR | Status: AC
  Filled 2018-10-22: qty 30

## 2018-10-22 MED ORDER — ASPIRIN EC 81 MG PO TBEC
81.0000 mg | DELAYED_RELEASE_TABLET | Freq: Every day | ORAL | Status: DC
  Administered 2018-10-23: 10:00:00 81 mg via ORAL
  Filled 2018-10-22: qty 1

## 2018-10-22 MED ORDER — HYDRALAZINE HCL 20 MG/ML IJ SOLN: 5.0000 mg | INTRAMUSCULAR | Status: DC | PRN

## 2018-10-22 MED ORDER — SODIUM CHLORIDE 0.9 % IV SOLN
INTRAVENOUS | Status: AC
  Filled 2018-10-22: qty 1.2

## 2018-10-22 MED ORDER — LABETALOL HCL 5 MG/ML IV SOLN
10.0000 mg | INTRAVENOUS | Status: DC | PRN
  Administered 2018-10-22 (×2): 10 mg via INTRAVENOUS
  Filled 2018-10-22 (×3): qty 4

## 2018-10-22 MED ORDER — SUGAMMADEX SODIUM 200 MG/2ML IV SOLN
INTRAVENOUS | Status: DC | PRN
  Administered 2018-10-22: 171.6 mg via INTRAVENOUS

## 2018-10-22 MED ORDER — CEFAZOLIN SODIUM-DEXTROSE 2-4 GM/100ML-% IV SOLN
2.0000 g | INTRAVENOUS | Status: AC
  Administered 2018-10-22: 2 g via INTRAVENOUS

## 2018-10-22 MED ORDER — PROTAMINE SULFATE 10 MG/ML IV SOLN
INTRAVENOUS | Status: AC
  Filled 2018-10-22: qty 5

## 2018-10-22 MED ORDER — ALUM & MAG HYDROXIDE-SIMETH 200-200-20 MG/5ML PO SUSP: 15.0000 mL | ORAL | Status: DC | PRN

## 2018-10-22 MED ORDER — ATORVASTATIN CALCIUM 10 MG PO TABS
20.0000 mg | ORAL_TABLET | Freq: Every day | ORAL | Status: DC
  Administered 2018-10-23: 10:00:00 20 mg via ORAL
  Filled 2018-10-22: qty 2

## 2018-10-22 MED ORDER — MAGNESIUM SULFATE 2 GM/50ML IV SOLN: 2.0000 g | Freq: Every day | INTRAVENOUS | Status: DC | PRN

## 2018-10-22 MED ORDER — MIDAZOLAM HCL 2 MG/2ML IJ SOLN: 0.5000 mg | Freq: Once | INTRAMUSCULAR | Status: DC | PRN

## 2018-10-22 MED ORDER — LACTATED RINGERS IV SOLN
INTRAVENOUS | Status: DC | PRN
  Administered 2018-10-22: 08:00:00 via INTRAVENOUS

## 2018-10-22 MED ORDER — FENTANYL CITRATE (PF) 250 MCG/5ML IJ SOLN
INTRAMUSCULAR | Status: AC
  Filled 2018-10-22: qty 5

## 2018-10-22 MED ORDER — SODIUM CHLORIDE 0.9 % IV SOLN
INTRAVENOUS | Status: DC | PRN
  Administered 2018-10-22: 50 ug/min via INTRAVENOUS

## 2018-10-22 MED ORDER — IODIXANOL 320 MG/ML IV SOLN
INTRAVENOUS | Status: DC | PRN
  Administered 2018-10-22: 32 mL via INTRA_ARTERIAL

## 2018-10-22 MED ORDER — HEMOSTATIC AGENTS (NO CHARGE) OPTIME
TOPICAL | Status: DC | PRN
  Administered 2018-10-22: 1 via TOPICAL

## 2018-10-22 MED ORDER — PROPOFOL 10 MG/ML IV BOLUS
INTRAVENOUS | Status: AC
  Filled 2018-10-22: qty 20

## 2018-10-22 MED ORDER — SODIUM CHLORIDE 0.9 % IV SOLN: 500.0000 mL | Freq: Once | INTRAVENOUS | Status: DC | PRN

## 2018-10-22 MED ORDER — OXYCODONE-ACETAMINOPHEN 5-325 MG PO TABS
1.0000 | ORAL_TABLET | ORAL | Status: DC | PRN
  Administered 2018-10-22: 21:00:00 2 via ORAL
  Filled 2018-10-22: qty 2

## 2018-10-22 MED ORDER — ROCURONIUM BROMIDE 50 MG/5ML IV SOSY
PREFILLED_SYRINGE | INTRAVENOUS | Status: DC | PRN
  Administered 2018-10-22: 50 mg via INTRAVENOUS

## 2018-10-22 MED ORDER — GUAIFENESIN-DM 100-10 MG/5ML PO SYRP: 15.0000 mL | ORAL_SOLUTION | ORAL | Status: DC | PRN

## 2018-10-22 MED ORDER — CEFAZOLIN SODIUM-DEXTROSE 2-4 GM/100ML-% IV SOLN
2.0000 g | Freq: Three times a day (TID) | INTRAVENOUS | Status: AC
  Administered 2018-10-22 (×2): 2 g via INTRAVENOUS
  Filled 2018-10-22 (×2): qty 100

## 2018-10-22 MED ORDER — OMEGA-3-ACID ETHYL ESTERS 1 G PO CAPS
1.0000 g | ORAL_CAPSULE | Freq: Two times a day (BID) | ORAL | Status: DC
  Administered 2018-10-22 – 2018-10-23 (×2): 1 g via ORAL
  Filled 2018-10-22 (×2): qty 1

## 2018-10-22 MED ORDER — ONDANSETRON HCL 4 MG/2ML IJ SOLN
INTRAMUSCULAR | Status: AC
  Filled 2018-10-22: qty 2

## 2018-10-22 MED ORDER — LINAGLIPTIN 5 MG PO TABS
5.0000 mg | ORAL_TABLET | Freq: Every day | ORAL | Status: DC
  Administered 2018-10-23: 10:00:00 5 mg via ORAL
  Filled 2018-10-22: qty 1

## 2018-10-22 MED ORDER — ACETAMINOPHEN 325 MG RE SUPP: 325.0000 mg | RECTAL | Status: DC | PRN

## 2018-10-22 MED ORDER — NITROGLYCERIN 0.4 MG SL SUBL: 0.4000 mg | SUBLINGUAL_TABLET | SUBLINGUAL | Status: DC | PRN

## 2018-10-22 MED ORDER — MORPHINE SULFATE (PF) 2 MG/ML IV SOLN: 2.0000 mg | INTRAVENOUS | Status: DC | PRN

## 2018-10-22 MED ORDER — LABETALOL HCL 5 MG/ML IV SOLN
INTRAVENOUS | Status: AC
  Filled 2018-10-22: qty 4

## 2018-10-22 MED ORDER — DEXAMETHASONE SODIUM PHOSPHATE 10 MG/ML IJ SOLN
INTRAMUSCULAR | Status: AC
  Filled 2018-10-22: qty 1

## 2018-10-22 MED ORDER — POTASSIUM CHLORIDE CRYS ER 20 MEQ PO TBCR: 20.0000 meq | EXTENDED_RELEASE_TABLET | Freq: Every day | ORAL | Status: DC | PRN

## 2018-10-22 MED ORDER — LIDOCAINE 2% (20 MG/ML) 5 ML SYRINGE
INTRAMUSCULAR | Status: AC
  Filled 2018-10-22: qty 5

## 2018-10-22 MED ORDER — FEBUXOSTAT 40 MG PO TABS
40.0000 mg | ORAL_TABLET | Freq: Every day | ORAL | Status: DC
  Administered 2018-10-23: 10:00:00 40 mg via ORAL
  Filled 2018-10-22 (×2): qty 1

## 2018-10-22 MED ORDER — GLIPIZIDE 5 MG PO TABS
5.0000 mg | ORAL_TABLET | Freq: Every day | ORAL | Status: DC
  Administered 2018-10-23: 10:00:00 5 mg via ORAL
  Filled 2018-10-22: qty 1

## 2018-10-22 MED ORDER — CHLORHEXIDINE GLUCONATE CLOTH 2 % EX PADS: 6.0000 | MEDICATED_PAD | Freq: Once | CUTANEOUS | Status: DC

## 2018-10-22 MED ORDER — LATANOPROST 0.005 % OP SOLN
1.0000 [drp] | Freq: Every day | OPHTHALMIC | Status: DC
  Administered 2018-10-22: 21:00:00 1 [drp] via OPHTHALMIC
  Filled 2018-10-22: qty 2.5

## 2018-10-22 MED ORDER — FENTANYL CITRATE (PF) 250 MCG/5ML IJ SOLN
INTRAMUSCULAR | Status: DC | PRN
  Administered 2018-10-22: 100 ug via INTRAVENOUS

## 2018-10-22 MED ORDER — CLOPIDOGREL BISULFATE 75 MG PO TABS
75.0000 mg | ORAL_TABLET | Freq: Every day | ORAL | Status: DC
  Administered 2018-10-23: 10:00:00 75 mg via ORAL
  Filled 2018-10-22: qty 1

## 2018-10-22 MED ORDER — ROCURONIUM BROMIDE 10 MG/ML (PF) SYRINGE
PREFILLED_SYRINGE | INTRAVENOUS | Status: AC
  Filled 2018-10-22: qty 10

## 2018-10-22 MED ORDER — PHENOL 1.4 % MT LIQD: 1.0000 | OROMUCOSAL | Status: DC | PRN

## 2018-10-22 MED ORDER — PROPOFOL 10 MG/ML IV BOLUS
INTRAVENOUS | Status: DC | PRN
  Administered 2018-10-22: 100 mg via INTRAVENOUS

## 2018-10-22 MED ORDER — METOPROLOL TARTRATE 5 MG/5ML IV SOLN: 2.0000 mg | INTRAVENOUS | Status: DC | PRN

## 2018-10-22 MED ORDER — POLYETHYLENE GLYCOL 3350 17 G PO PACK: 17.0000 g | PACK | Freq: Every day | ORAL | Status: DC | PRN

## 2018-10-22 MED ORDER — ONDANSETRON HCL 4 MG/2ML IJ SOLN: 4.0000 mg | Freq: Four times a day (QID) | INTRAMUSCULAR | Status: DC | PRN

## 2018-10-22 MED ORDER — ATENOLOL 25 MG PO TABS
50.0000 mg | ORAL_TABLET | Freq: Two times a day (BID) | ORAL | Status: DC
  Administered 2018-10-22 – 2018-10-23 (×2): 50 mg via ORAL
  Filled 2018-10-22 (×2): qty 2

## 2018-10-22 MED ORDER — DOCUSATE SODIUM 100 MG PO CAPS
100.0000 mg | ORAL_CAPSULE | Freq: Every day | ORAL | Status: DC
  Administered 2018-10-23: 10:00:00 100 mg via ORAL
  Filled 2018-10-22: qty 1

## 2018-10-22 MED ORDER — RIVAROXABAN 2.5 MG PO TABS
2.5000 mg | ORAL_TABLET | Freq: Two times a day (BID) | ORAL | Status: DC
  Administered 2018-10-23: 10:00:00 2.5 mg via ORAL
  Filled 2018-10-22 (×2): qty 1

## 2018-10-22 MED ORDER — UMECLIDINIUM BROMIDE 62.5 MCG/INH IN AEPB
1.0000 | INHALATION_SPRAY | Freq: Every day | RESPIRATORY_TRACT | Status: DC
  Administered 2018-10-23: 1 via RESPIRATORY_TRACT
  Filled 2018-10-22: qty 7

## 2018-10-22 MED ORDER — 0.9 % SODIUM CHLORIDE (POUR BTL) OPTIME
TOPICAL | Status: DC | PRN
  Administered 2018-10-22: 1000 mL

## 2018-10-22 MED ORDER — BISACODYL 10 MG RE SUPP: 10.0000 mg | Freq: Every day | RECTAL | Status: DC | PRN

## 2018-10-22 MED ORDER — SODIUM CHLORIDE 0.9 % IV SOLN: INTRAVENOUS | Status: DC

## 2018-10-22 MED ORDER — PROMETHAZINE HCL 25 MG/ML IJ SOLN: 6.2500 mg | INTRAMUSCULAR | Status: DC | PRN

## 2018-10-22 MED ORDER — SODIUM CHLORIDE 0.9 % IV SOLN
INTRAVENOUS | Status: DC | PRN
  Administered 2018-10-22: 500 mL

## 2018-10-22 MED ORDER — SODIUM CHLORIDE 0.9 % IV SOLN
0.0125 ug/kg/min | INTRAVENOUS | Status: AC
  Administered 2018-10-22: 08:00:00 .15 ug/kg/min via INTRAVENOUS
  Filled 2018-10-22: qty 2000

## 2018-10-22 MED ORDER — CEFAZOLIN SODIUM-DEXTROSE 2-4 GM/100ML-% IV SOLN
INTRAVENOUS | Status: AC
  Filled 2018-10-22: qty 100

## 2018-10-22 MED ORDER — PROTAMINE SULFATE 10 MG/ML IV SOLN
INTRAVENOUS | Status: DC | PRN
  Administered 2018-10-22: 50 mg via INTRAVENOUS

## 2018-10-22 MED ORDER — HEPARIN SODIUM (PORCINE) 1000 UNIT/ML IJ SOLN
INTRAMUSCULAR | Status: DC | PRN
  Administered 2018-10-22: 9000 [IU] via INTRAVENOUS

## 2018-10-22 SURGICAL SUPPLY — 74 items
BAG BANDED W/RUBBER/TAPE 36X54 (MISCELLANEOUS) ×3 IMPLANT
BALLN STERLING RX 6X30X80 (BALLOONS) ×3
BALLOON STERLING RX 6X30X80 (BALLOONS) ×1 IMPLANT
CANISTER SUCT 3000ML PPV (MISCELLANEOUS) ×3 IMPLANT
CATH ROBINSON RED A/P 18FR (CATHETERS) IMPLANT
CATH SUCT 10FR WHISTLE TIP (CATHETERS) ×3 IMPLANT
CLIP VESOCCLUDE MED 6/CT (CLIP) ×3 IMPLANT
CLIP VESOCCLUDE SM WIDE 6/CT (CLIP) ×3 IMPLANT
COVER DOME SNAP 22 D (MISCELLANEOUS) ×3 IMPLANT
COVER PROBE W GEL 5X96 (DRAPES) ×3 IMPLANT
COVER WAND RF STERILE (DRAPES) ×3 IMPLANT
DERMABOND ADVANCED (GAUZE/BANDAGES/DRESSINGS) ×2
DERMABOND ADVANCED .7 DNX12 (GAUZE/BANDAGES/DRESSINGS) ×1 IMPLANT
DRAIN CHANNEL 15F RND FF W/TCR (WOUND CARE) IMPLANT
DRAPE INCISE IOBAN 66X45 STRL (DRAPES) ×6 IMPLANT
DRSG TEGADERM 2-3/8X2-3/4 SM (GAUZE/BANDAGES/DRESSINGS) ×3 IMPLANT
ELECT REM PT RETURN 9FT ADLT (ELECTROSURGICAL) ×3
ELECTRODE REM PT RTRN 9FT ADLT (ELECTROSURGICAL) ×1 IMPLANT
EVACUATOR SILICONE 100CC (DRAIN) IMPLANT
GAUZE SPONGE 2X2 8PLY STRL LF (GAUZE/BANDAGES/DRESSINGS) ×1 IMPLANT
GLOVE BIOGEL PI IND STRL 7.5 (GLOVE) ×1 IMPLANT
GLOVE BIOGEL PI INDICATOR 7.5 (GLOVE) ×2
GLOVE SURG SS PI 7.5 STRL IVOR (GLOVE) ×3 IMPLANT
GOWN STRL REUS W/ TWL LRG LVL3 (GOWN DISPOSABLE) ×2 IMPLANT
GOWN STRL REUS W/ TWL XL LVL3 (GOWN DISPOSABLE) ×1 IMPLANT
GOWN STRL REUS W/TWL LRG LVL3 (GOWN DISPOSABLE) ×4
GOWN STRL REUS W/TWL XL LVL3 (GOWN DISPOSABLE) ×2
GUIDEWIRE ENROUTE 0.014 (WIRE) ×3 IMPLANT
HEMOSTAT SNOW SURGICEL 2X4 (HEMOSTASIS) ×3 IMPLANT
INSERT FOGARTY SM (MISCELLANEOUS) IMPLANT
INTRODUCER KIT GALT 7CM (INTRODUCER) ×2
IV ADAPTER SYR DOUBLE MALE LL (MISCELLANEOUS) IMPLANT
KIT BASIN OR (CUSTOM PROCEDURE TRAY) ×3 IMPLANT
KIT ENCORE 26 ADVANTAGE (KITS) ×3 IMPLANT
KIT INTRODUCER GALT 7 (INTRODUCER) ×1 IMPLANT
KIT TURNOVER KIT B (KITS) ×3 IMPLANT
NEEDLE HYPO 25GX1X1/2 BEV (NEEDLE) IMPLANT
NEEDLE PERC 18GX7CM (NEEDLE) ×3 IMPLANT
NEEDLE SPNL 20GX3.5 QUINCKE YW (NEEDLE) IMPLANT
PACK CAROTID (CUSTOM PROCEDURE TRAY) ×3 IMPLANT
PACK UNIVERSAL I (CUSTOM PROCEDURE TRAY) ×3 IMPLANT
PAD ARMBOARD 7.5X6 YLW CONV (MISCELLANEOUS) ×6 IMPLANT
POSITIONER HEAD DONUT 9IN (MISCELLANEOUS) ×3 IMPLANT
PROTECTION STATION PRESSURIZED (MISCELLANEOUS) ×3
SET MICROPUNCTURE 5F STIFF (MISCELLANEOUS) ×3 IMPLANT
SHEATH AVANTI 11CM 5FR (SHEATH) IMPLANT
SHUNT CAROTID BYPASS 12FRX15.5 (VASCULAR PRODUCTS) IMPLANT
SPONGE GAUZE 2X2 STER 10/PKG (GAUZE/BANDAGES/DRESSINGS) ×2
STATION PROTECTION PRESSURIZED (MISCELLANEOUS) ×1 IMPLANT
STENT TRANSCAROTID SYS 10X40 (Permanent Stent) ×3 IMPLANT
STOPCOCK 4 WAY LG BORE MALE ST (IV SETS) ×3 IMPLANT
SUT ETHILON 3 0 PS 1 (SUTURE) IMPLANT
SUT MNCRL AB 4-0 PS2 18 (SUTURE) ×3 IMPLANT
SUT PROLENE 5 0 C 1 24 (SUTURE) ×6 IMPLANT
SUT PROLENE 6 0 BV (SUTURE) ×6 IMPLANT
SUT PROLENE 7 0 BV 1 (SUTURE) IMPLANT
SUT SILK 2 0 PERMA HAND 18 BK (SUTURE) ×3 IMPLANT
SUT SILK 2 0 SH CR/8 (SUTURE) ×3 IMPLANT
SUT SILK 3 0 (SUTURE)
SUT SILK 3-0 18XBRD TIE 12 (SUTURE) IMPLANT
SUT VIC AB 3-0 SH 27 (SUTURE) ×6
SUT VIC AB 3-0 SH 27X BRD (SUTURE) ×3 IMPLANT
SYR 10ML LL (SYRINGE) ×9 IMPLANT
SYR 20ML LL LF (SYRINGE) ×3 IMPLANT
SYR 5ML LL (SYRINGE) ×3 IMPLANT
SYR CONTROL 10ML LL (SYRINGE) IMPLANT
SYSTEM TRANSCAROTID NEUROPRTCT (MISCELLANEOUS) ×1 IMPLANT
TOWEL GREEN STERILE (TOWEL DISPOSABLE) ×3 IMPLANT
TRANSCAROTID NEUROPROTECT SYS (MISCELLANEOUS) ×3
TUBING ART PRESS 48 MALE/FEM (TUBING) IMPLANT
TUBING EXTENTION W/L.L. (IV SETS) IMPLANT
WATER STERILE IRR 1000ML POUR (IV SOLUTION) ×3 IMPLANT
WIRE AMPLATZ SS-J .035X180CM (WIRE) IMPLANT
WIRE BENTSON .035X145CM (WIRE) ×3 IMPLANT

## 2018-10-22 NOTE — Anesthesia Procedure Notes (Signed)
Procedure Name: Intubation Date/Time: 10/22/2018 8:33 AM Performed by: Kathryne Hitch, CRNA Pre-anesthesia Checklist: Patient identified, Emergency Drugs available, Suction available and Patient being monitored Patient Re-evaluated:Patient Re-evaluated prior to induction Oxygen Delivery Method: Circle system utilized Preoxygenation: Pre-oxygenation with 100% oxygen Induction Type: IV induction Ventilation: Mask ventilation with difficulty, Two handed mask ventilation required and Oral airway inserted - appropriate to patient size Laryngoscope Size: Sabra Heck and 2 Grade View: Grade I Tube type: Oral Tube size: 7.5 mm Number of attempts: 1 Airway Equipment and Method: Stylet and Oral airway Placement Confirmation: ETT inserted through vocal cords under direct vision,  positive ETCO2 and breath sounds checked- equal and bilateral Secured at: 23 cm Tube secured with: Tape Dental Injury: Teeth and Oropharynx as per pre-operative assessment

## 2018-10-22 NOTE — Op Note (Signed)
    Patient name: Dean Ellison MRN: [id number] DOB: 28-Dec-1955 Sex: male  10/22/2018 Pre-operative Diagnosis: Asymptomatic right carotid stent in-stent stenosis Post-operative diagnosis:  Same Surgeon:  Erlene Quan C. Donzetta Matters, MD Assistant: Annamarie Major, MD Procedure Performed:  Right trans-carotid artery stenting with 10 x 40 Enroute stent with flow reversal neuro protection  Indications: 63 year old male has previous undergone right trans-carotid artery stenting now with in-stent stenosis is indicated for repeat stenting.  Findings: There was probably 60 to 70% stenosis midportion of the stent.  After stenting this is resolved.  We predilated the previous stent with a 6 mm balloon placed a new 10 x 40 stent   Procedure:  The patient was identified in the holding area and taken to the operating room where is put supine operative general anesthesia induced.  He was sterilely prepped and draped the neck chest bilateral groins in usual fashion antibiotics were administered timeout was called.  We began with opening his previous incision down to the level of the carotid artery.  Concomitantly ultrasound was used to identify the left common femoral vein which was cannulated micropuncture needle followed by wire and sheath.  Amplatz wire was placed followed by 8 [place] sheath.  We placed a figure-of-eight stitch in the common carotid artery after placing a vessel loop and umbilical tape around this.  Patient was given 9000 use of heparin ACT returned nearly 300.  We cannulated the carotid with micropuncture needle followed by wire to 3 cm.  We placed a micro-sheath at 3 cm.  Angiography was performed identifying the stenosis in the stent.  We placed the Amplatz wire placed the 8 French sheath and affixed this to the skin.  Flow reversal was established and confirmed.  TCAR timeout was performed.  We clamped the common carotid artery.  We again establish flow reversal.  We crossed the lesion with wire predilated  with 6 mm balloon he did not have any heart rate changes.  We placed a 10 x 40 stent.  After 2 minutes were performed angiography.  No postdilatation was necessary.  Wire was moved clamp was taken off after 7 minutes time total.  We removed our sheath and cinched with 5-0 Prolene suture.  50 mg of protamine was administered.  Sheath was pulled from left groin pressure held for 10 minutes until hemostasis obtained.  He is awake and anesthesia having tolerated procedure without any complication.  EBL: 50 cc    Delena Casebeer C. Donzetta Matters, MD [facility] [place] Office: [phone] Pager: [phone]

## 2018-10-22 NOTE — Transfer of Care (Signed)
Immediate Anesthesia Transfer of Care Note  Patient: Dean Ellison  Procedure(s) Performed: TRANSCAROTID ARTERY REVASCULARIZATION REDO (Right Neck)  Patient Location: PACU  Anesthesia Type:General  Level of Consciousness: drowsy and patient cooperative  Airway & Oxygen Therapy: Patient Spontanous Breathing  Post-op Assessment: Report given to RN and Post -op Vital signs reviewed and stable  Post vital signs: Reviewed and stable  Last Vitals:  Vitals Value Taken Time  BP 140/69   Temp    Pulse    Resp 15 10/22/18 0933  SpO2    Vitals shown include unvalidated device data.  Last Pain:  Vitals:   10/22/18 0622  TempSrc:   PainSc: 0-No pain      Patients Stated Pain Goal: 3 ([id number])  Complications: No apparent anesthesia complications

## 2018-10-22 NOTE — H&P (Signed)
   History and Physical Update  The patient was interviewed and re-examined.  The patient's previous History and Physical has been reviewed and is unchanged from recent office visit. Plan for redo right tcar.   Dean Ellison C. Donzetta Matters, MD [facility] [place] Office: [phone] Pager: [phone]  10/22/2018, 7:23 AM

## 2018-10-22 NOTE — Interval H&P Note (Signed)
History and Physical Interval Note:  10/22/2018 7:32 AM  Dean Ellison  has presented today for surgery, with the diagnosis of right carotid restenosis.  The various methods of treatment have been discussed with the patient and family. After consideration of risks, benefits and other options for treatment, the patient has consented to  Procedure(s): TRANSCAROTID ARTERY REVASCULARIZATION REDO (Right) as a surgical intervention.  The patient's history has been reviewed, patient examined, no change in status, stable for surgery.  I have reviewed the patient's chart and labs.  Questions were answered to the patient's satisfaction.     Annamarie Major

## 2018-10-22 NOTE — Discharge Instructions (Signed)
° °  [facility] ° °Discharge Instructions °  °Carotid Endarterectomy (CEA) ° °Please refer to the following instructions for your post-procedure care. Your surgeon or physician assistant will discuss any changes with you. ° °Activity ° °You are encouraged to walk as much as you can. You can slowly return to normal activities but must avoid strenuous activity and heavy lifting until your doctor tell you it's okay. Avoid activities such as vacuuming or swinging a golf club. You can drive after one week if you are comfortable and you are no longer taking prescription pain medications. It is normal to feel tired for serval weeks after your surgery. It is also normal to have difficulty with sleep habits, eating, and bowel movements after surgery. These will go away with time. ° °Bathing/Showering ° °Shower daily after you go home. Do not soak in a bathtub, hot tub, or swim until the incision heals completely. ° °Incision Care ° °Shower every day. Clean your incision with mild soap and water. Pat the area dry with a clean towel. You do not need a bandage unless otherwise instructed. Do not apply any ointments or creams to your incision. You may have skin glue on your incision. Do not peel it off. It will come off on its own in about one week. Your incision may feel thickened and raised for several weeks after your surgery. This is normal and the skin will soften over time.  ° °For Men Only: It's okay to shave around the incision but do not shave the incision itself for 2 weeks. It is common to have numbness under your chin that could last for several months. ° °Diet ° °Resume your normal diet. There are no special food restrictions following this procedure. A low fat/low cholesterol diet is recommended for all patients with vascular disease. In order to heal from your surgery, it is CRITICAL to get adequate nutrition. Your body requires vitamins, minerals, and protein. Vegetables are the  best source of vitamins and minerals. Vegetables also provide the perfect balance of protein. Processed food has little nutritional value, so try to avoid this. ° °Medications ° °Resume taking all of your medications unless your doctor or physician assistant tells you not to. If your incision is causing pain, you may take over-the- counter pain relievers such as acetaminophen (Tylenol). If you were prescribed a stronger pain medication, please be aware these medications can cause nausea and constipation. Prevent nausea by taking the medication with a snack or meal. Avoid constipation by drinking plenty of fluids and eating foods with a high amount of fiber, such as fruits, vegetables, and grains.  °Do not take Tylenol if you are taking prescription pain medications. ° °Follow Up ° °Our office will schedule a follow up appointment 2-3 weeks following discharge. ° °Please call us immediately for any of the following conditions ° °Increased pain, redness, drainage (pus) from your incision site. °Fever of 101 degrees or higher. °If you should develop stroke (slurred speech, difficulty swallowing, weakness on one side of your body, loss of vision) you should call 911 and go to the nearest emergency room. ° °Reduce your risk of vascular disease: ° °Stop smoking. If you would like help call [facility] at 1-800-QUIT-NOW (1-800-784-8669) or [place] at 336-586-4000. °Manage your cholesterol °Maintain a desired weight °Control your diabetes °Keep your blood pressure down ° °If you have any questions, please call the office at [phone]. ° °

## 2018-10-22 NOTE — Anesthesia Postprocedure Evaluation (Signed)
Anesthesia Post Note  Patient: Dean Ellison  Procedure(s) Performed: TRANSCAROTID ARTERY REVASCULARIZATION REDO (Right Neck)     Patient location during evaluation: PACU Anesthesia Type: General Level of consciousness: awake and alert, patient cooperative and oriented Pain management: pain level controlled Vital Signs Assessment: post-procedure vital signs reviewed and stable Respiratory status: spontaneous breathing, nonlabored ventilation and respiratory function stable Cardiovascular status: blood pressure returned to baseline and stable Postop Assessment: no apparent nausea or vomiting Anesthetic complications: no    Last Vitals:  Vitals:   10/22/18 1330 10/22/18 1400  BP: 125/62   Pulse:  74  Resp:  20  Temp:  (!) 36.1 C  SpO2:  99%    Last Pain:  Vitals:   10/22/18 1330  TempSrc:   PainSc: 0-No pain                 [name],E. Monna Crean

## 2018-10-22 NOTE — Anesthesia Procedure Notes (Signed)
Arterial Line Insertion Start/End8/26/2020 7:00 AM, 10/22/2018 7:15 AM Performed by: Kathryne Hitch, CRNA, CRNA  Patient location: Pre-op. Preanesthetic checklist: patient identified, IV checked, site marked, risks and benefits discussed, surgical consent, monitors and equipment checked and pre-op evaluation Lidocaine 1% used for infiltration radial was placed Catheter size: 20 Fr Hand hygiene performed  and maximum sterile barriers used   Attempts: 1 Procedure performed without using ultrasound guided technique. Following insertion, dressing applied and Biopatch. Post procedure assessment: normal and unchanged  Patient tolerated the procedure well with no immediate complications.

## 2018-10-23 ENCOUNTER — Encounter (HOSPITAL_COMMUNITY): Payer: Self-pay | Admitting: General Practice

## 2018-10-23 LAB — BASIC METABOLIC PANEL
Anion gap: 10 (ref 5–15)
BUN: 26 mg/dL — ABNORMAL HIGH (ref 8–23)
CO2: 23 mmol/L (ref 22–32)
Calcium: 8.6 mg/dL — ABNORMAL LOW (ref 8.9–10.3)
Chloride: 103 mmol/L (ref 98–111)
Creatinine, Ser: 1.58 mg/dL — ABNORMAL HIGH (ref 0.61–1.24)
GFR calc Af Amer: 54 mL/min — ABNORMAL LOW (ref >60)
GFR calc non Af Amer: 46 mL/min — ABNORMAL LOW (ref >60)
Glucose, Bld: 232 mg/dL — ABNORMAL HIGH (ref 70–99)
Potassium: 4.6 mmol/L (ref 3.5–5.1)
Sodium: 136 mmol/L (ref 135–145)

## 2018-10-23 LAB — CBC
HCT: 31.7 % — ABNORMAL LOW (ref 39.0–52.0)
Hemoglobin: 10.5 g/dL — ABNORMAL LOW (ref 13.0–17.0)
MCH: 30.6 pg (ref 26.0–34.0)
MCHC: 33.1 g/dL (ref 30.0–36.0)
MCV: 92.4 fL (ref 80.0–100.0)
Platelets: 210 10*3/uL (ref 150–400)
RBC: 3.43 MIL/uL — ABNORMAL LOW (ref 4.22–5.81)
RDW: 12.7 % (ref 11.5–15.5)
WBC: 9.6 10*3/uL (ref 4.0–10.5)
nRBC: 0 % (ref 0.0–0.2)

## 2018-10-23 LAB — GLUCOSE, CAPILLARY
Glucose-Capillary: 199 mg/dL — ABNORMAL HIGH (ref 70–99)
Glucose-Capillary: 228 mg/dL — ABNORMAL HIGH (ref 70–99)

## 2018-10-23 MED ORDER — OXYCODONE-ACETAMINOPHEN 5-325 MG PO TABS: 1.0000 | ORAL_TABLET | Freq: Four times a day (QID) | ORAL | 0 refills | Status: DC | PRN

## 2018-10-23 NOTE — Plan of Care (Signed)
Discharge to home °

## 2018-10-23 NOTE — Progress Notes (Addendum)
[facility]  Subjective  - Doing well and pleased with surgery.   Objective (!) 113/51 67 98.3 F (36.8 C) (Oral) 16 92%  Intake/Output Summary (Last 24 hours) at 10/23/2018 0711 Last data filed at 10/23/2018 0405 Gross per 24 hour  Intake 3938.28 ml  Output 1550 ml  Net 2388.28 ml    Right neck incision without hematoma, minimal ecchymosis No tongue deviation, smile symmetric Moving all 4 ext. Lungs non labored breathing  Assessment/Planning: POD # 1 Right trans-carotid artery stenting with 10 x 40 Enroute stent with flow reversal neuro protection  No neuro deficits Plan to discharge home today with f/u 4 weeks with duplex with Dr. Donzetta Matters.   Roxy Horseman 10/23/2018 7:11 AM --  Laboratory Lab Results: Recent Labs    10/20/18 1126 10/23/18 0305  WBC 7.4 9.6  HGB 13.2 10.5*  HCT 41.2 31.7*  PLT 239 210   BMET Recent Labs    10/20/18 1126 10/23/18 0305  NA 137 136  K 4.3 4.6  CL 102 103  CO2 21* 23  GLUCOSE 289* 232*  BUN 23 26*  CREATININE 1.71* 1.58*  CALCIUM 9.4 8.6*    COAG Lab Results  Component Value Date   INR 1.1 10/22/2018   INR 1.3 (H) 10/20/2018   INR 0.96 07/16/2017   No results found for: PTT  I have independently interviewed and examined patient and agree with PA assessment and plan above.   Alima Naser C. Donzetta Matters, MD [facility] Office: [phone] Pager: [phone]

## 2018-10-23 NOTE — Plan of Care (Signed)
Continue to monitor

## 2018-10-23 NOTE — Progress Notes (Signed)
IV's and telemetry removed at this time. Patient tolerated well. Discharge instructions reviewed with patient. All questions answered.   Emelda Fear, RN

## 2018-10-23 NOTE — Progress Notes (Addendum)
Care plan reviewed, Pt is progressing. Pt appears alert and oriented x 4,Surgical wound with liquid skin glue on his right neck is dry, intact and clean. Pt has very little to no pain on his surgical wound, but he has chronic pain from his right shoulder. Pain scale 3-4/10, applied cool compress with ice pack, tolerated well, able to rest tonight.  Pt ambulated by dangled on bedside and accidentally pulled his A-line out. No active bleeding,no hematoma on right radial. Pressure dressing applied.  Remains afebrile, sinus rhythm on monitor, HR 60s-80s, BP stable, no acute distress noted. SPO2 91-93 with room air. Continue to monitor.  Kennyth Lose, RN

## 2018-10-27 NOTE — Discharge Summary (Signed)
[facility] Discharge Summary   Patient ID:  Dean Ellison MRN: [id number] DOB/AGE: 62-May-1957 62 y.o.  Admit date: 10/22/2018 Discharge date: 10/23/2018 Date of Surgery: 10/22/2018 Surgeon: Surgeon(s): Serafina Mitchell, MD Waynetta Sandy, MD  Admission Diagnosis: right carotid restenosis  Discharge Diagnoses:  right carotid restenosis  Secondary Diagnoses: Past Medical History:  Diagnosis Date  . Anemia   . Arthritis   . CAD (coronary artery disease)   . Carotid artery occlusion   . COPD (chronic obstructive pulmonary disease) ([place])   . Diabetes mellitus without complication ([place])   . Dyspnea   . Gout   . Hyperlipidemia   . Hypertension   . OSA (obstructive sleep apnea)    does not use CPAP  . Persistent disorder of initiating or maintaining sleep   . Presence of permanent cardiac pacemaker   . Vitamin D deficiency     Procedure(s): TRANSCAROTID ARTERY REVASCULARIZATION REDO  Discharged Condition: stable  HPI: Dean Ellison is a 63 y.o. male with previously underwent trans-carotid artery stenting on the right for high-grade asymptomatic stenosis.  Dr. Donzetta Matters scheduled him for Redo [facility].   Hospital Course:  Dean Ellison is a 63 y.o. male is S/P Right Procedure(s): TRANSCAROTID ARTERY REVASCULARIZATION REDO   Consults:  Treatment Team:  Waynetta Sandy, MD   The patient had an uneventful admission without neuro deficits and moving all 4 extremities.  He was discharged home POD#1.      Significant Diagnostic Studies: CBC Lab Results  Component Value Date   WBC 9.6 10/23/2018   HGB 10.5 (L) 10/23/2018   HCT 31.7 (L) 10/23/2018   MCV 92.4 10/23/2018   PLT 210 10/23/2018    BMET    Component Value Date/Time   NA 136 10/23/2018 0305   K 4.6 10/23/2018 0305   CL 103 10/23/2018 0305   CO2 23 10/23/2018 0305   GLUCOSE 232 (H) 10/23/2018 0305   BUN 26 (H) 10/23/2018 0305   CREATININE 1.58 (H) 10/23/2018  0305   CALCIUM 8.6 (L) 10/23/2018 0305   GFRNONAA 46 (L) 10/23/2018 0305   GFRAA 54 (L) 10/23/2018 0305   COAG Lab Results  Component Value Date   INR 1.1 10/22/2018   INR 1.3 (H) 10/20/2018   INR 0.96 07/16/2017     Disposition:  Discharge to :Home Discharge Instructions    Call MD for:  redness, tenderness, or signs of infection (pain, swelling, bleeding, redness, odor or green/yellow discharge around incision site)   Complete by: As directed    Call MD for:  severe or increased pain, loss or decreased feeling  in affected limb(s)   Complete by: As directed    Call MD for:  temperature >100.5   Complete by: As directed    Resume previous diet   Complete by: As directed      Allergies as of 10/23/2018      Reactions   Meperidine Other (See Comments)   Demerol. Pt stated "it made me crazy"      Medication List    TAKE these medications   aspirin EC 81 MG tablet Take 81 mg by mouth daily.   atenolol 50 MG tablet Commonly known as: TENORMIN Take 50 mg by mouth 2 (two) times daily.   atorvastatin 20 MG tablet Commonly known as: LIPITOR Take 20 mg by mouth daily.   clopidogrel 75 MG tablet Commonly known as: PLAVIX Take 75 mg by mouth daily.   Dulaglutide 1.5 MG/0.5ML  Sopn Inject 1.5 mg into the skin every Tuesday.   famotidine 20 MG tablet Commonly known as: PEPCID Take 20 mg by mouth daily.   febuxostat 40 MG tablet Commonly known as: ULORIC Take 40 mg by mouth daily.   furosemide 20 MG tablet Commonly known as: LASIX Take 20 mg by mouth daily.   glipiZIDE 5 MG tablet Commonly known as: GLUCOTROL Take 5 mg by mouth daily.   Jardiance 10 MG Tabs tablet Generic drug: empagliflozin Take 10 mg by mouth daily.   latanoprost 0.005 % ophthalmic solution Commonly known as: XALATAN Place 1 drop into both eyes at bedtime.   lisinopril 20 MG tablet Commonly known as: ZESTRIL Take 10 mg by mouth daily.   nitroGLYCERIN 0.4 MG SL tablet Commonly known  as: NITROSTAT Place 0.4 mg under the tongue every 5 (five) minutes as needed for chest pain.   oxyCODONE-acetaminophen 5-325 MG tablet Commonly known as: PERCOCET/ROXICET Take 1 tablet by mouth every 6 (six) hours as needed for moderate pain.   sitaGLIPtin 100 MG tablet Commonly known as: JANUVIA Take 100 mg by mouth daily.   tiotropium 18 MCG inhalation capsule Commonly known as: SPIRIVA Place 18 mcg into inhaler and inhale daily.   varenicline 1 MG tablet Commonly known as: CHANTIX Take 1 mg by mouth 2 (two) times daily.   Vascepa 1 g Caps Generic drug: Icosapent Ethyl Take 1 g by mouth 2 (two) times daily.   Xarelto 2.5 MG Tabs tablet Generic drug: rivaroxaban Take 2.5 mg by mouth 2 (two) times daily.      Verbal and written Discharge instructions given to the patient. Wound care per Discharge AVS Follow-up Information    Waynetta Sandy, MD Follow up in 4 week(s).   Specialties: Vascular Surgery, Cardiology Why: office will arrange Contact information: [address] [place] [address] [phone]           Signed: Roxy Horseman 10/27/2018, 8:11 AM --- For VQI Registry use --- Instructions: Press F2 to tab through selections.  Delete question if not applicable.   Modified Rankin score at D/C (0-6): Rankin Score=0  IV medication needed for:  1. Hypertension: No 2. Hypotension: No  Post-op Complications: No  1. Post-op CVA or TIA: No  If yes: Event classification (right eye, left eye, right cortical, left cortical, verterobasilar, other):   If yes: Timing of event (intra-op, <6 hrs post-op, >=6 hrs post-op, unknown):   2. CN injury: No  If yes: CN  injuried   3. Myocardial infarction: No  If yes: Dx by (EKG or clinical, Troponin):   4.  CHF: No  5.  Dysrhythmia (new): No  6. Wound infection: No  7. Reperfusion symptoms: No  8. Return to OR: No  If yes: return to OR for (bleeding, neurologic, other CEA incision,  other):   Discharge medications: Statin use:  Yes ASA use:  Yes Beta blocker use:  Yes ACE-Inhibitor use:  Yes P2Y12 Antagonist use: [ ]  None, [x ] Plavix, [ ]  Plasugrel, [ ]  Ticlopinine, [ ]  Ticagrelor, [ ]  Other, [ ]  No for medical reason, [ ]  Non-compliant, [ ]  Not-indicated Anti-coagulant use:  [ ]  None, [ ]  Warfarin, [x ] Rivaroxaban, [ ]  Dabigatran, [ ]  Other, [ ]  No for medical reason, [ ]  Non-compliant, [ ]  Not-indicated

## 2018-11-25 ENCOUNTER — Other Ambulatory Visit: Payer: Self-pay

## 2018-11-25 DIAGNOSIS — I6523 Occlusion and stenosis of bilateral carotid arteries: Secondary | ICD-10-CM

## 2018-11-28 ENCOUNTER — Other Ambulatory Visit: Payer: Self-pay

## 2018-11-28 ENCOUNTER — Encounter: Payer: Self-pay | Admitting: Vascular Surgery

## 2018-11-28 ENCOUNTER — Ambulatory Visit (HOSPITAL_COMMUNITY)
Admission: RE | Admit: 2018-11-28 | Discharge: 2018-11-28 | Disposition: A | Discharge status: Home / Self Care | Payer: Medicare HMO | Source: Ambulatory Visit | Attending: Vascular Surgery | Admitting: Vascular Surgery

## 2018-11-28 ENCOUNTER — Ambulatory Visit (INDEPENDENT_AMBULATORY_CARE_PROVIDER_SITE_OTHER): Payer: Medicare HMO | Admitting: Vascular Surgery

## 2018-11-28 VITALS — BP 99/63 | HR 66 | Temp 97.5°F | Resp 20 | Ht 70.0 in | Wt 186.9 lb

## 2018-11-28 DIAGNOSIS — I6523 Occlusion and stenosis of bilateral carotid arteries: Secondary | ICD-10-CM | POA: Diagnosis present

## 2018-11-28 NOTE — Progress Notes (Signed)
    Subjective:     Patient ID: Dean Ellison, male   DOB: May 26, 1955, 63 y.o.   MRN: [id number]  HPI 63 year old male follows up after redo right trans-carotid artery stenting.  He has done very well since surgery.  He actually feels as though his upper body strength is maybe improving since surgery.  He is down 4 to 5 cigarettes/week with the help of Chantix.  Review of Systems No complaints today    Objective:   Physical Exam Vitals:   11/28/18 1420  BP: 105/65  Pulse: 66  Resp: 20  Temp: (!) 97.5 F (36.4 C)  SpO2: 96%   I have independently interpreted his carotid duplex and stent on the right ICA appears widely patent with velocities greatest peak systolic velocity 96 and end-diastolic velocity of 20.  Left side demonstrates 40 to 59% stenosis in the ICA.    Assessment:     62 year old male status post redo right transcranial artery stenting.    Plan:     Continue aspirin, Plavix, statin after 3 months at which time can stop Plavix given that he is also on Xarelto He will need aspirin statin and Xarelto long-term Encouraged smoking sensation as he is getting much closer Follow-up in 9 months with repeat carotid duplex.    Jahniyah Revere C. Donzetta Matters, MD [facility] [place] Office: [phone] Pager: [phone]

## 2019-09-03 ENCOUNTER — Other Ambulatory Visit: Payer: Self-pay | Admitting: *Deleted

## 2019-09-03 DIAGNOSIS — I6523 Occlusion and stenosis of bilateral carotid arteries: Secondary | ICD-10-CM

## 2019-09-09 ENCOUNTER — Ambulatory Visit: Payer: Medicare HMO | Admitting: Physician Assistant

## 2019-09-09 ENCOUNTER — Other Ambulatory Visit: Payer: Self-pay

## 2019-09-09 ENCOUNTER — Ambulatory Visit (HOSPITAL_COMMUNITY)
Admission: RE | Admit: 2019-09-09 | Discharge: 2019-09-09 | Disposition: A | Discharge status: Home / Self Care | Payer: Medicare HMO | Source: Ambulatory Visit | Attending: Vascular Surgery | Admitting: Vascular Surgery

## 2019-09-09 VITALS — BP 118/74 | HR 70 | Temp 97.5°F | Resp 20 | Ht 70.0 in | Wt 187.8 lb

## 2019-09-09 DIAGNOSIS — I6523 Occlusion and stenosis of bilateral carotid arteries: Secondary | ICD-10-CM | POA: Diagnosis present

## 2019-09-09 NOTE — Progress Notes (Signed)
Office Note     CC:  follow up Requesting Provider:  Secundino Ginger, PA-C  HPI: Dean Ellison is a 64 y.o. (Aug 15, 1955) male who presents for follow up of carotid stenosis. He is s/p redo of transcarotid artery stenting of his right ICA in August of 2020 by Dr. Trula Slade. His initial transcarotid artery stenting was done in May of 2019 by Dr. Donzetta Matters.  He was last seen in October of 2020 by Dr. Donzetta Matters. At that time he was doing very well. He was working on his smoking cessation at the time. He was advised to continue his Aspirin, Plavix and Statin for 3 months post op and then he could stop his Plavix and continue his Xarelto long term. He was scheduled to follow up in 9 months with repeat ultrasound  He presents today for his 9 month follow up and Carotid duplex. He has had no new neurological symptoms. He denies any amaurosis fugax, facial drooping, slurred speech, upper or lower extremity weakness or numbness.   He does report thigh claudication symptoms. He says this has been present for several years now but only recently was he told by his PCP that he has " blockages in his legs". He says he mostly notices his symptoms when he is ambulating outside. He will develops muscle spasms or cramping in his right greater than left thigh and this will radiate into his hips. If he stops and rests it will resolve but then it will occur again on continued ambulation. He does have some calf claudication but it is minimal. He denies any rest pain or non healing wounds. He reports that he had his legs ultrasound and pressure test earlier this year and was thinking that we would have access to these reports today  The pt is on a statin for cholesterol management.  The pt is on a daily aspirin.   Other FW:YOVZCHY for sick sinus syndrome The pt is on BB, ACE for hypertension.   The pt is diabetic.  Tobacco hx: current smoker, 4 cigarettes/ week on chantix  Past Medical History:  Diagnosis Date  . Anemia   .  Arthritis   . CAD (coronary artery disease)   . Carotid artery occlusion   . COPD (chronic obstructive pulmonary disease) ([place])   . Diabetes mellitus without complication ([place])   . Dyspnea   . Gout   . Hyperlipidemia   . Hypertension   . OSA (obstructive sleep apnea)    does not use CPAP  . Persistent disorder of initiating or maintaining sleep   . Presence of permanent cardiac pacemaker   . Vitamin D deficiency     Past Surgical History:  Procedure Laterality Date  . BACK SURGERY  1991  . CARDIAC CATHETERIZATION Right 2013  . CATARACT EXTRACTION Bilateral 1994  . CORONARY ARTERY BYPASS GRAFT  2013  . PACEMAKER INSERTION    . TRANSCAROTID ARTERY REVASCULARIZATION (TCAR) Right 10/22/2018   TRANSCAROTID ARTERY REVASCULARIZATION REDO (Right Neck)  . TRANSCAROTID ARTERY REVASCULARIZATION Right 07/18/2017   Procedure: TRANSCAROTID ARTERY REVASCULARIZATION;  Surgeon: Waynetta Sandy, MD;  Location: [facility];  Service: Vascular;  Laterality: Right;  . TRANSCAROTID ARTERY REVASCULARIZATION Right 10/22/2018   Procedure: TRANSCAROTID ARTERY REVASCULARIZATION REDO;  Surgeon: Serafina Mitchell, MD;  Location: [facility] OR;  Service: Vascular;  Laterality: Right;    Social History   Socioeconomic History  . Marital status: Legally Separated    Spouse name: Not on file  . Number of children: Not  on file  . Years of education: Not on file  . Highest education level: Not on file  Occupational History  . Not on file  Tobacco Use  . Smoking status: Current Every Day Smoker    Packs/day: 0.25    Years: 40.00    Pack years: 10.00    Types: Cigarettes  . Smokeless tobacco: Never Used  . Tobacco comment: 4 cigarettes a week  Vaping Use  . Vaping Use: Former  Substance and Sexual Activity  . Alcohol use: Yes    Alcohol/week: 1.0 standard drink    Types: 1 Cans of beer per week    Comment: 1 every 6 months or more  . Drug use: No  . Sexual activity: Not on file  Other Topics  Concern  . Not on file  Social History Narrative  . Not on file   Social Determinants of Health   Financial Resource Strain:   . Difficulty of Paying Living Expenses:   Food Insecurity:   . Worried About Charity fundraiser in the Last Year:   . Arboriculturist in the Last Year:   Transportation Needs:   . Film/video editor (Medical):   Marland Kitchen Lack of Transportation (Non-Medical):   Physical Activity:   . Days of Exercise per Week:   . Minutes of Exercise per Session:   Stress:   . Feeling of Stress :   Social Connections:   . Frequency of Communication with Friends and Family:   . Frequency of Social Gatherings with Friends and Family:   . Attends Religious Services:   . Active Member of Clubs or Organizations:   . Attends Archivist Meetings:   Marland Kitchen Marital Status:   Intimate Partner Violence:   . Fear of Current or Ex-Partner:   . Emotionally Abused:   Marland Kitchen Physically Abused:   . Sexually Abused:     Family History  Problem Relation Age of Onset  . Heart disease Mother   . Diabetes Mother   . Hypertension Mother   . Arthritis Mother   . Heart disease Father   . Diabetes Father   . Hypertension Father   . Arthritis Father     Current Outpatient Medications  Medication Sig Dispense Refill  . aspirin EC 81 MG tablet Take 81 mg by mouth daily.    Marland Kitchen atenolol (TENORMIN) 50 MG tablet Take 50 mg by mouth 2 (two) times daily.     Marland Kitchen atorvastatin (LIPITOR) 20 MG tablet Take 20 mg by mouth daily.    . clopidogrel (PLAVIX) 75 MG tablet Take 75 mg by mouth daily.    . Dulaglutide 1.5 MG/0.5ML SOPN Inject 1.5 mg into the skin every Tuesday.     . empagliflozin (JARDIANCE) 10 MG TABS tablet Take 10 mg by mouth daily.     . famotidine (PEPCID) 20 MG tablet Take 20 mg by mouth daily.     . febuxostat (ULORIC) 40 MG tablet Take 40 mg by mouth daily.    . furosemide (LASIX) 20 MG tablet Take 20 mg by mouth daily.     Marland Kitchen glipiZIDE (GLUCOTROL) 5 MG tablet Take 5 mg by mouth  daily.    Vanessa Kick Ethyl (VASCEPA) 1 g CAPS Take 1 g by mouth 2 (two) times daily.    Marland Kitchen latanoprost (XALATAN) 0.005 % ophthalmic solution Place 1 drop into both eyes at bedtime.  5  . lisinopril (ZESTRIL) 20 MG tablet Take 10 mg by mouth daily.     Marland Kitchen  nitroGLYCERIN (NITROSTAT) 0.4 MG SL tablet Place 0.4 mg under the tongue every 5 (five) minutes as needed for chest pain.    . sitaGLIPtin (JANUVIA) 100 MG tablet Take 100 mg by mouth daily.    Marland Kitchen tiotropium (SPIRIVA) 18 MCG inhalation capsule Place 18 mcg into inhaler and inhale daily.     . varenicline (CHANTIX) 1 MG tablet Take 1 mg by mouth 2 (two) times daily.    Alveda Reasons 2.5 MG TABS tablet Take 2.5 mg by mouth 2 (two) times daily.      No current facility-administered medications for this visit.    Allergies  Allergen Reactions  . Meperidine Other (See Comments)    Demerol. Pt stated "it made me crazy"     REVIEW OF SYSTEMS:  [X]  denotes positive finding, [ ]  denotes negative finding Cardiac  Comments:  Chest pain or chest pressure:    Shortness of breath upon exertion:    Short of breath when lying flat:    Irregular heart rhythm:        Vascular    Pain in calf, thigh, or hip brought on by ambulation: X   Pain in feet at night that wakes you up from your sleep:     Blood clot in your veins:    Leg swelling:         Pulmonary    Oxygen at home:    Productive cough:     Wheezing:         Neurologic    Sudden weakness in arms or legs:     Sudden numbness in arms or legs:     Sudden onset of difficulty speaking or slurred speech:    Temporary loss of vision in one eye:     Problems with dizziness:         Gastrointestinal    Blood in stool:     Vomited blood:         Genitourinary    Burning when urinating:     Blood in urine:        Psychiatric    Major depression:         Hematologic    Bleeding problems:    Problems with blood clotting too easily:        Skin    Rashes or ulcers:          Constitutional    Fever or chills:      PHYSICAL EXAMINATION:  Vitals:   09/09/19 1559 09/09/19 1603  BP: 116/74 118/74  Pulse: 70   Resp: 20   Temp: (!) 97.5 F (36.4 C)   TempSrc: Temporal   SpO2: 95%   Weight: 187 lb 12.8 oz (85.2 kg)   Height: 5\' 10"  (1.778 m)     General:  WDWN in NAD; vital signs documented above Gait: Normal HENT: WNL, normocephalic Pulmonary: normal non-labored breathing , without wheezing Cardiac: regular HR, without  Murmurs without carotid bruit Abdomen: soft, NT, no masses Vascular Exam/Pulses:2+ radial pulses, 2+ femoral pulses bilaterally, 2+ popliteal pulses bilaterally, PT pulse on left, unable to palpate DP in either foot. Not able to palpate right PT Extremities: without ischemic changes, without Gangrene , without cellulitis; without open wounds; both feet warm. Motor and sensory intact Musculoskeletal: no muscle wasting or atrophy  Neurologic: A&O X 3;  No focal weakness or paresthesias are detected Psychiatric:  The pt has Normal affect.   Non-Invasive Vascular Imaging:   09/09/19 Right Carotid: Patent stent with  no evidence for restenosis. There is some plaque visualized but it is not flow limiting. The ECA appears >50% stenosed.   Left Carotid: Velocities in the left ICA are consistent with a 40-59% stenosis.   Vertebrals: Bilateral vertebral arteries demonstrate antegrade flow.  Subclavians: Normal flow hemodynamics were seen in bilateral subclavian arteries.    ASSESSMENT/PLAN:: 64 y.o. male here for follow up for carotid stenosis. He is status post redo of right transcarotid stenting. His non invasive studies today are essentially unchanged from his last visit. His right ICA stent is patent with no recurrent stenosis.  The left ICA remains in the 40-59% stenosis. He is without symptoms related to his carotid disease. He is having bilateral right greater than left thigh claudication and hip claudication symptoms. Prior ABI's that  we have from 2019 were essentially normal. He reports outside studies showing a "blockage". We do not have access to these studies at this time. Because the studies were done in January or December of 2020 per patient I have recommended setting him up for arterial duplex and ABIs. He is agreeable to this. - he will return for ABI's and bilateral arterial duplex in the next 3-4 weeks  - He will remain on his Aspirin, Statin and Xarelto -He will have carotid duplex in 1 year   Karoline Caldwell, PA-C [facility] [phone]  Clinic MD:   Oneida Alar

## 2019-09-10 ENCOUNTER — Other Ambulatory Visit: Payer: Self-pay | Admitting: *Deleted

## 2019-09-10 DIAGNOSIS — I739 Peripheral vascular disease, unspecified: Secondary | ICD-10-CM

## 2019-10-07 ENCOUNTER — Ambulatory Visit (HOSPITAL_COMMUNITY)
Admission: RE | Admit: 2019-10-07 | Discharge: 2019-10-07 | Disposition: A | Discharge status: Home / Self Care | Payer: Medicare HMO | Source: Ambulatory Visit | Attending: Physician Assistant | Admitting: Physician Assistant

## 2019-10-07 ENCOUNTER — Other Ambulatory Visit: Payer: Self-pay

## 2019-10-07 ENCOUNTER — Encounter: Payer: Self-pay | Admitting: *Deleted

## 2019-10-07 ENCOUNTER — Ambulatory Visit (INDEPENDENT_AMBULATORY_CARE_PROVIDER_SITE_OTHER)
Admission: RE | Admit: 2019-10-07 | Discharge: 2019-10-07 | Disposition: A | Discharge status: Home / Self Care | Payer: Medicare HMO | Source: Ambulatory Visit | Attending: Physician Assistant | Admitting: Physician Assistant

## 2019-10-07 ENCOUNTER — Ambulatory Visit (INDEPENDENT_AMBULATORY_CARE_PROVIDER_SITE_OTHER): Payer: Medicare HMO | Admitting: Physician Assistant

## 2019-10-07 VITALS — BP 115/66 | HR 66 | Temp 97.8°F | Resp 20 | Ht 70.0 in | Wt 189.4 lb

## 2019-10-07 DIAGNOSIS — I739 Peripheral vascular disease, unspecified: Secondary | ICD-10-CM | POA: Insufficient documentation

## 2019-10-07 DIAGNOSIS — I6523 Occlusion and stenosis of bilateral carotid arteries: Secondary | ICD-10-CM

## 2019-10-07 NOTE — Progress Notes (Signed)
Office Note     CC:  follow up Requesting Provider:  Secundino Ginger, PA-C  HPI: Dean Ellison is a 64 y.o. ([birth year]) male who presents for follow up of peripheral arterial disease.  He was last seen on 09/09/19 at which time he was having claudication type symptoms. I saw him at this visit and scheduled him for non invasive arterial studies of his lower extremities. He returns today for that follow up.   He continues to have mostly thigh claudication symptoms. He says this has been present for several years now but only recently was he told by his PCP that he has " blockages in his legs". He says he mostly notices his symptoms when he is ambulating outside. He will develops muscle spasms or cramping in his right greater than left thigh and this will radiate into his hips however when asking him which leg is worse he says they are the same. If he stops and rests it will resolve but then it will occur again on continued ambulation. He does have some calf claudication but it is minimal. He denies any rest pain or non healing wounds. He explains that this is really limiting him and he feels he would be much more active if he did not have this leg discomfort  He is also s/p redo of right transcarotid artery stenting. This was done on 10/22/18 by Dr. Donzetta Matters Dr. Trula Slade for asymptomatic right carotid in stent restenosis. He has done well post operatively regarding his carotid stenosis. He remains without any symptoms of TIA or stroke.he denies any vision changes, slurred speech, facial drooping, weakness or numbness of upper or lower extremities.  The pt is on a statin for cholesterol management.  The pt is on a daily aspirin.   Other AC:  Xarelto The pt is on BB, ACE for hypertension.   The pt is diabetic.  Tobacco hx: current smoker, 4 cigs/ week on chantix  Past Medical History:  Diagnosis Date  . Anemia   . Arthritis   . CAD (coronary artery disease)   . Carotid artery occlusion   . COPD  (chronic obstructive pulmonary disease) ([place])   . Diabetes mellitus without complication ([place])   . Dyspnea   . Gout   . Hyperlipidemia   . Hypertension   . OSA (obstructive sleep apnea)    does not use CPAP  . Persistent disorder of initiating or maintaining sleep   . Presence of permanent cardiac pacemaker   . Vitamin D deficiency     Past Surgical History:  Procedure Laterality Date  . BACK SURGERY  1991  . CARDIAC CATHETERIZATION Right 2013  . CATARACT EXTRACTION Bilateral 1994  . CORONARY ARTERY BYPASS GRAFT  2013  . PACEMAKER INSERTION    . TRANSCAROTID ARTERY REVASCULARIZATION (TCAR) Right 10/22/2018   TRANSCAROTID ARTERY REVASCULARIZATION REDO (Right Neck)  . TRANSCAROTID ARTERY REVASCULARIZATION Right 07/18/2017   Procedure: TRANSCAROTID ARTERY REVASCULARIZATION;  Surgeon: Waynetta Sandy, MD;  Location: [facility];  Service: Vascular;  Laterality: Right;  . TRANSCAROTID ARTERY REVASCULARIZATION Right 10/22/2018   Procedure: TRANSCAROTID ARTERY REVASCULARIZATION REDO;  Surgeon: Serafina Mitchell, MD;  Location[facility];  Service: Vascular;  Laterality: Right;    Social History   Socioeconomic History  . Marital status: Legally Separated    Spouse name: Not on file  . Number of children: Not on file  . Years of education: Not on file  . Highest education level: Not on file  Occupational History  .  Not on file  Tobacco Use  . Smoking status: Current Every Day Smoker    Packs/day: 0.25    Years: 40.00    Pack years: 10.00    Types: Cigarettes  . Smokeless tobacco: Never Used  . Tobacco comment: 4 cigarettes a week  Vaping Use  . Vaping Use: Former  Substance and Sexual Activity  . Alcohol use: Yes    Alcohol/week: 1.0 standard drink    Types: 1 Cans of beer per week    Comment: 1 every 6 months or more  . Drug use: No  . Sexual activity: Not on file  Other Topics Concern  . Not on file  Social History Narrative  . Not on file   Social Determinants  of Health   Financial Resource Strain:   . Difficulty of Paying Living Expenses:   Food Insecurity:   . Worried About Charity fundraiser in the Last Year:   . Arboriculturist in the Last Year:   Transportation Needs:   . Film/video editor (Medical):   Marland Kitchen Lack of Transportation (Non-Medical):   Physical Activity:   . Days of Exercise per Week:   . Minutes of Exercise per Session:   Stress:   . Feeling of Stress :   Social Connections:   . Frequency of Communication with Friends and Family:   . Frequency of Social Gatherings with Friends and Family:   . Attends Religious Services:   . Active Member of Clubs or Organizations:   . Attends Archivist Meetings:   Marland Kitchen Marital Status:   Intimate Partner Violence:   . Fear of Current or Ex-Partner:   . Emotionally Abused:   Marland Kitchen Physically Abused:   . Sexually Abused:     Family History  Problem Relation Age of Onset  . Heart disease Mother   . Diabetes Mother   . Hypertension Mother   . Arthritis Mother   . Heart disease Father   . Diabetes Father   . Hypertension Father   . Arthritis Father     Current Outpatient Medications  Medication Sig Dispense Refill  . aspirin EC 81 MG tablet Take 81 mg by mouth daily.    Marland Kitchen atenolol (TENORMIN) 50 MG tablet Take 50 mg by mouth 2 (two) times daily.     Marland Kitchen atorvastatin (LIPITOR) 20 MG tablet Take 20 mg by mouth daily.    . Blood Glucose Monitoring Suppl (TRUE METRIX METER) w/Device KIT     . clopidogrel (PLAVIX) 75 MG tablet Take 75 mg by mouth daily.    . Dulaglutide 1.5 MG/0.5ML SOPN Inject 1.5 mg into the skin every Tuesday.     . empagliflozin (JARDIANCE) 10 MG TABS tablet Take 10 mg by mouth daily.     . famotidine (PEPCID) 20 MG tablet Take 20 mg by mouth daily.     . febuxostat (ULORIC) 40 MG tablet Take 40 mg by mouth daily.    . furosemide (LASIX) 20 MG tablet Take 20 mg by mouth daily.     Marland Kitchen glipiZIDE (GLUCOTROL) 5 MG tablet Take 5 mg by mouth daily.    Vanessa Kick Ethyl (VASCEPA) 1 g CAPS Take 1 g by mouth 2 (two) times daily.    Marland Kitchen latanoprost (XALATAN) 0.005 % ophthalmic solution Place 1 drop into both eyes at bedtime.  5  . lisinopril (ZESTRIL) 20 MG tablet Take 10 mg by mouth daily.     . nitroGLYCERIN (NITROSTAT) 0.4 MG  SL tablet Place 0.4 mg under the tongue every 5 (five) minutes as needed for chest pain.    . sitaGLIPtin (JANUVIA) 100 MG tablet Take 100 mg by mouth daily.    Marland Kitchen tiotropium (SPIRIVA) 18 MCG inhalation capsule Place 18 mcg into inhaler and inhale daily.     . TRUE METRIX BLOOD GLUCOSE TEST test strip     . TRUEplus Lancets 33G MISC     . varenicline (CHANTIX) 1 MG tablet Take 1 mg by mouth 2 (two) times daily.    Alveda Reasons 2.5 MG TABS tablet Take 2.5 mg by mouth 2 (two) times daily.      No current facility-administered medications for this visit.    Allergies  Allergen Reactions  . Meperidine Other (See Comments)    Demerol. Pt stated "it made me crazy"     REVIEW OF SYSTEMS:  [X]  denotes positive finding, [ ]  denotes negative finding Cardiac  Comments:  Chest pain or chest pressure:    Shortness of breath upon exertion:    Short of breath when lying flat:    Irregular heart rhythm:        Vascular    Pain in calf, thigh, or hip brought on by ambulation: X   Pain in feet at night that wakes you up from your sleep:     Blood clot in your veins:    Leg swelling:         Pulmonary    Oxygen at home:    Productive cough:     Wheezing:         Neurologic    Sudden weakness in arms or legs:     Sudden numbness in arms or legs:     Sudden onset of difficulty speaking or slurred speech:    Temporary loss of vision in one eye:     Problems with dizziness:         Gastrointestinal    Blood in stool:     Vomited blood:         Genitourinary    Burning when urinating:     Blood in urine:        Psychiatric    Major depression:         Hematologic    Bleeding problems:    Problems with blood  clotting too easily:        Skin    Rashes or ulcers:        Constitutional    Fever or chills:      PHYSICAL EXAMINATION:  Vitals:   10/07/19 0925  BP: 115/66  Pulse: 66  Resp: 20  Temp: 97.8 F (36.6 C)  TempSrc: Temporal  SpO2: 96%  Weight: 189 lb 6.4 oz (85.9 kg)  Height: 5' 10"  (1.778 m)    General:  WDWN in NAD; vital signs documented above Gait: Normal HENT: WNL, normocephalic Pulmonary: normal non-labored breathing , without wheezing Cardiac: regular HR, without  Murmurs without carotid bruit Abdomen: soft, NT, no masses Vascular Exam/Pulses:2+ radial pulses, 2+ femoral pulses bilaterally, 2+ popliteal pulses bilaterally, PT pulse on left, unable to palpate DP in either foot. Not able to palpate right PT Extremities: without ischemic changes, without Gangrene , without cellulitis; without open wounds; bilateral feet warm. Motor and sensation intact Musculoskeletal: no muscle wasting or atrophy  Neurologic: A&O X 3;  No focal weakness or paresthesias are detected Psychiatric:  The pt has Normal affect.   Non-Invasive Vascular Imaging:   +-------+-----------+-----------+------------+------------+  ABI/TBIToday's ABIToday's TBIPrevious ABIPrevious TBI  +-------+-----------+-----------+------------+------------+  Right 0.94    0.87    1.14    0.77      +-------+-----------+-----------+------------+------------+  Left  0.99    0.69    1.05    0.57      +-------+-----------+-----------+------------+------------+    +----------+--------+-----+---------------+---------+--------+  RIGHT   PSV cm/sRatioStenosis    Waveform Comments  +----------+--------+-----+---------------+---------+--------+  EIA Prox 93              biphasic       +----------+--------+-----+---------------+---------+--------+  EIA Mid  87              biphasic         +----------+--------+-----+---------------+---------+--------+  EIA Distal138              biphasic       +----------+--------+-----+---------------+---------+--------+  CFA Prox 142              biphasic       +----------+--------+-----+---------------+---------+--------+  CFA Distal92              triphasic      +----------+--------+-----+---------------+---------+--------+  DFA    49                         +----------+--------+-----+---------------+---------+--------+  SFA Prox 146              biphasic       +----------+--------+-----+---------------+---------+--------+  SFA Mid  224      30-49% stenosisbiphasic       +----------+--------+-----+---------------+---------+--------+  SFA Distal58              biphasic       +----------+--------+-----+---------------+---------+--------+  POP Prox 48              biphasic       +----------+--------+-----+---------------+---------+--------+  POP Distal36              biphasic       +----------+--------+-----+---------------+---------+--------+  ATA Distal35              biphasic       +----------+--------+-----+---------------+---------+--------+  PTA Distal45              biphasic       +----------+--------+-----+---------------+---------+--------+   A focal velocity elevation of 224 cm/s was obtained at [facility] SFA with a VR of 1.9. Findings are characteristic of 30-49% stenosis.     +----------+--------+-----+---------------+---------+--------+  LEFT   PSV cm/sRatioStenosis    Waveform Comments  +----------+--------+-----+---------------+---------+--------+  EIA Prox 125              triphasic       +----------+--------+-----+---------------+---------+--------+  EIA Mid  168              triphasic      +----------+--------+-----+---------------+---------+--------+  EIA Distal148              biphasic       +----------+--------+-----+---------------+---------+--------+  DFA    47              biphasic       +----------+--------+-----+---------------+---------+--------+  SFA Prox 97              biphasic       +----------+--------+-----+---------------+---------+--------+  SFA Mid  329      50-74% stenosisbiphasic       +----------+--------+-----+---------------+---------+--------+  SFA Distal59              biphasic       +----------+--------+-----+---------------+---------+--------+  POP Prox 59              biphasic       +----------+--------+-----+---------------+---------+--------+  POP Distal70              biphasic       +----------+--------+-----+---------------+---------+--------+  ATA Distal53              biphasic       +----------+--------+-----+---------------+---------+--------+  PTA Distal55              biphasic       +----------+--------+-----+---------------+---------+--------+   A focal velocity elevation of 329 cm/s was obtained at [facility] SFA with a VR of 3.4. Findings are characteristic of 50-74% stenosis.     ASSESSMENT/PLAN:: 64 y.o. male here for follow up for peripheral arterial disease. Overall his ABI's have decreased slightly bilaterally, but essentially unchanged. His arterial duplex does show bilateral SFA lesions, left greater than right. Because of patients lifestyle limiting claudication I have recommended an Angiogram. In regard to his carotid disease his last duplex was stable and he remains asymptomatic. He will continue his  Aspirin and Statin - He will have his carotid duplex in 1 year - He is CKD stage III so this will likely need to be CO2 Arteriogram. I feel this should be okay as most of his disease appears to be in the SFA bilaterally - I discussed risks/ benefits of procedure including bleeding, arterial injury or dissection, thrombosis, or need for surgical intervention - He is on Xarelto which he will need to hold for 3 days - I have scheduled him for an Aortogram, arteriogram of bilateral lower extremities with possible LLE intervention with Dr. Jeri Lager, PA-C [facility] [phone]  Clinic MD: Dr. Oneida Alar

## 2019-10-12 ENCOUNTER — Telehealth: Payer: Self-pay

## 2019-10-12 NOTE — Telephone Encounter (Signed)
Message received from pt regarding procedure scheduled on 11/09/19 and request to r/s Covid test on 9/7 or 9/8 because he will be going out of town on 9/10 and return morning of surgery. If not will need to r/s surgery. Attempted to reach pt back. No answer. LMOM to return call.

## 2019-10-16 ENCOUNTER — Encounter: Payer: Self-pay | Admitting: *Deleted

## 2019-11-06 ENCOUNTER — Other Ambulatory Visit: Payer: Medicare HMO

## 2019-11-13 ENCOUNTER — Other Ambulatory Visit: Payer: Self-pay

## 2019-11-13 ENCOUNTER — Other Ambulatory Visit
Admission: RE | Admit: 2019-11-13 | Discharge: 2019-11-13 | Disposition: A | Discharge status: Home / Self Care | Payer: Medicare HMO | Source: Ambulatory Visit | Attending: Vascular Surgery | Admitting: Vascular Surgery

## 2019-11-13 DIAGNOSIS — Z01812 Encounter for preprocedural laboratory examination: Secondary | ICD-10-CM | POA: Insufficient documentation

## 2019-11-13 DIAGNOSIS — Z20822 Contact with and (suspected) exposure to covid-19: Secondary | ICD-10-CM | POA: Insufficient documentation

## 2019-11-14 LAB — SARS CORONAVIRUS 2 (TAT 6-24 HRS): SARS Coronavirus 2: NEGATIVE

## 2019-11-16 ENCOUNTER — Other Ambulatory Visit: Payer: Self-pay

## 2019-11-16 ENCOUNTER — Encounter (HOSPITAL_COMMUNITY)
Admission: RE | Disposition: A | Discharge status: Home / Self Care | Payer: Self-pay | Source: Home / Self Care | Attending: Vascular Surgery

## 2019-11-16 ENCOUNTER — Ambulatory Visit (HOSPITAL_COMMUNITY)
Admission: RE | Admit: 2019-11-16 | Discharge: 2019-11-16 | Disposition: A | Discharge status: Home / Self Care | Payer: Medicare HMO | Attending: Vascular Surgery | Admitting: Vascular Surgery

## 2019-11-16 DIAGNOSIS — Z7984 Long term (current) use of oral hypoglycemic drugs: Secondary | ICD-10-CM | POA: Diagnosis not present

## 2019-11-16 DIAGNOSIS — I2510 Atherosclerotic heart disease of native coronary artery without angina pectoris: Secondary | ICD-10-CM | POA: Insufficient documentation

## 2019-11-16 DIAGNOSIS — I129 Hypertensive chronic kidney disease with stage 1 through stage 4 chronic kidney disease, or unspecified chronic kidney disease: Secondary | ICD-10-CM | POA: Insufficient documentation

## 2019-11-16 DIAGNOSIS — E559 Vitamin D deficiency, unspecified: Secondary | ICD-10-CM | POA: Insufficient documentation

## 2019-11-16 DIAGNOSIS — G4733 Obstructive sleep apnea (adult) (pediatric): Secondary | ICD-10-CM | POA: Diagnosis not present

## 2019-11-16 DIAGNOSIS — Z7901 Long term (current) use of anticoagulants: Secondary | ICD-10-CM | POA: Insufficient documentation

## 2019-11-16 DIAGNOSIS — J449 Chronic obstructive pulmonary disease, unspecified: Secondary | ICD-10-CM | POA: Diagnosis not present

## 2019-11-16 DIAGNOSIS — Z950 Presence of cardiac pacemaker: Secondary | ICD-10-CM | POA: Diagnosis not present

## 2019-11-16 DIAGNOSIS — F17210 Nicotine dependence, cigarettes, uncomplicated: Secondary | ICD-10-CM | POA: Diagnosis not present

## 2019-11-16 DIAGNOSIS — E785 Hyperlipidemia, unspecified: Secondary | ICD-10-CM | POA: Diagnosis not present

## 2019-11-16 DIAGNOSIS — N183 Chronic kidney disease, stage 3 unspecified: Secondary | ICD-10-CM | POA: Insufficient documentation

## 2019-11-16 DIAGNOSIS — E1122 Type 2 diabetes mellitus with diabetic chronic kidney disease: Secondary | ICD-10-CM | POA: Insufficient documentation

## 2019-11-16 DIAGNOSIS — Z7902 Long term (current) use of antithrombotics/antiplatelets: Secondary | ICD-10-CM | POA: Diagnosis not present

## 2019-11-16 DIAGNOSIS — Z7982 Long term (current) use of aspirin: Secondary | ICD-10-CM | POA: Insufficient documentation

## 2019-11-16 DIAGNOSIS — I70213 Atherosclerosis of native arteries of extremities with intermittent claudication, bilateral legs: Secondary | ICD-10-CM | POA: Insufficient documentation

## 2019-11-16 DIAGNOSIS — E1151 Type 2 diabetes mellitus with diabetic peripheral angiopathy without gangrene: Secondary | ICD-10-CM | POA: Insufficient documentation

## 2019-11-16 DIAGNOSIS — I6529 Occlusion and stenosis of unspecified carotid artery: Secondary | ICD-10-CM | POA: Insufficient documentation

## 2019-11-16 DIAGNOSIS — Z79899 Other long term (current) drug therapy: Secondary | ICD-10-CM | POA: Diagnosis not present

## 2019-11-16 DIAGNOSIS — M109 Gout, unspecified: Secondary | ICD-10-CM | POA: Insufficient documentation

## 2019-11-16 HISTORY — PX: ABDOMINAL AORTOGRAM W/LOWER EXTREMITY: CATH118223

## 2019-11-16 LAB — GLUCOSE, CAPILLARY: Glucose-Capillary: 172 mg/dL — ABNORMAL HIGH (ref 70–99)

## 2019-11-16 LAB — POCT I-STAT, CHEM 8
BUN: 25 mg/dL — ABNORMAL HIGH (ref 8–23)
Calcium, Ion: 1.15 mmol/L (ref 1.15–1.40)
Chloride: 99 mmol/L (ref 98–111)
Creatinine, Ser: 1.6 mg/dL — ABNORMAL HIGH (ref 0.61–1.24)
Glucose, Bld: 197 mg/dL — ABNORMAL HIGH (ref 70–99)
HCT: 43 % (ref 39.0–52.0)
Hemoglobin: 14.6 g/dL (ref 13.0–17.0)
Potassium: 4.6 mmol/L (ref 3.5–5.1)
Sodium: 140 mmol/L (ref 135–145)
TCO2: 27 mmol/L (ref 22–32)

## 2019-11-16 SURGERY — ABDOMINAL AORTOGRAM W/LOWER EXTREMITY
Anesthesia: LOCAL

## 2019-11-16 MED ORDER — LIDOCAINE HCL (PF) 1 % IJ SOLN
INTRAMUSCULAR | Status: AC
  Filled 2019-11-16: qty 30

## 2019-11-16 MED ORDER — LIDOCAINE HCL (PF) 1 % IJ SOLN
INTRAMUSCULAR | Status: DC | PRN
  Administered 2019-11-16: 18 mL

## 2019-11-16 MED ORDER — MIDAZOLAM HCL 2 MG/2ML IJ SOLN
INTRAMUSCULAR | Status: DC | PRN
  Administered 2019-11-16: 2 mg via INTRAVENOUS

## 2019-11-16 MED ORDER — HEPARIN (PORCINE) IN NACL 1000-0.9 UT/500ML-% IV SOLN
INTRAVENOUS | Status: DC | PRN
  Administered 2019-11-16 (×4): 500 mL

## 2019-11-16 MED ORDER — ONDANSETRON HCL 4 MG/2ML IJ SOLN
INTRAMUSCULAR | Status: DC | PRN
  Administered 2019-11-16: 4 mg via INTRAVENOUS

## 2019-11-16 MED ORDER — IODIXANOL 320 MG/ML IV SOLN
INTRAVENOUS | Status: DC | PRN
  Administered 2019-11-16: 10 mL

## 2019-11-16 MED ORDER — MIDAZOLAM HCL 2 MG/2ML IJ SOLN
INTRAMUSCULAR | Status: AC
  Filled 2019-11-16: qty 2

## 2019-11-16 MED ORDER — ONDANSETRON HCL 4 MG/2ML IJ SOLN
INTRAMUSCULAR | Status: AC
  Filled 2019-11-16: qty 2

## 2019-11-16 MED ORDER — HEPARIN (PORCINE) IN NACL 1000-0.9 UT/500ML-% IV SOLN
INTRAVENOUS | Status: AC
  Filled 2019-11-16: qty 1000

## 2019-11-16 MED ORDER — SODIUM CHLORIDE 0.9 % IV SOLN: INTRAVENOUS | Status: DC

## 2019-11-16 SURGICAL SUPPLY — 12 items
CATH OMNI FLUSH 5F 65CM (CATHETERS) ×2 IMPLANT
CLOSURE MYNX CONTROL 5F (Vascular Products) ×2 IMPLANT
FILTER CO2 0.2 MICRON (VASCULAR PRODUCTS) ×2 IMPLANT
KIT MICROPUNCTURE NIT STIFF (SHEATH) ×2 IMPLANT
KIT PV (KITS) ×2 IMPLANT
RESERVOIR CO2 (VASCULAR PRODUCTS) ×2 IMPLANT
SET FLUSH CO2 (MISCELLANEOUS) ×2 IMPLANT
SHEATH PINNACLE 5F 10CM (SHEATH) ×2 IMPLANT
SHEATH PROBE COVER 6X72 (BAG) ×2 IMPLANT
TRANSDUCER W/STOPCOCK (MISCELLANEOUS) ×2 IMPLANT
TRAY PV CATH (CUSTOM PROCEDURE TRAY) ×2 IMPLANT
WIRE STARTER BENTSON 035X150 (WIRE) ×2 IMPLANT

## 2019-11-16 NOTE — Discharge Instructions (Signed)
Angiogram, Care After This sheet gives you information about how to care for yourself after your procedure. Your health care provider may also give you more specific instructions. If you have problems or questions, contact your health care provider. What can I expect after the procedure? After the procedure, it is common to have bruising and tenderness at the catheter insertion area. Follow these instructions at home: Insertion site care  Follow instructions from your health care provider about how to take care of your insertion site. Make sure you: ? Wash your hands with soap and water before you change your bandage (dressing). If soap and water are not available, use hand sanitizer. ? Change your dressing as told by your health care provider. ? Leave stitches (sutures), skin glue, or adhesive strips in place. These skin closures may need to stay in place for 2 weeks or longer. If adhesive strip edges start to loosen and curl up, you may trim the loose edges. Do not remove adhesive strips completely unless your health care provider tells you to do that.  Do not take baths, swim, or use a hot tub until your health care provider approves.  You may shower 24-48 hours after the procedure or as told by your health care provider. ? Gently wash the site with plain soap and water. ? Pat the area dry with a clean towel. ? Do not rub the site. This may cause bleeding.  Do not apply powder or lotion to the site. Keep the site clean and dry.  Check your insertion site every day for signs of infection. Check for: ? Redness, swelling, or pain. ? Fluid or blood. ? Warmth. ? Pus or a bad smell. Activity  Rest as told by your health care provider, usually for 1-2 days.  Do not lift anything that is heavier than 10 lbs. (4.5 kg) or as told by your health care provider.  Do not drive for 24 hours if you were given a medicine to help you relax (sedative).  Do not drive or use heavy machinery while  taking prescription pain medicine. General instructions   Return to your normal activities as told by your health care provider, usually in about a week. Ask your health care provider what activities are safe for you.  If the catheter site starts bleeding, lie flat and put pressure on the site. If the bleeding does not stop, get help right away. This is a medical emergency.  Drink enough fluid to keep your urine clear or pale yellow. This helps flush the contrast dye from your body.  Take over-the-counter and prescription medicines only as told by your health care provider.  Keep all follow-up visits as told by your health care provider. This is important. Contact a health care provider if:  You have a fever or chills.  You have redness, swelling, or pain around your insertion site.  You have fluid or blood coming from your insertion site.  The insertion site feels warm to the touch.  You have pus or a bad smell coming from your insertion site.  You have bruising around the insertion site.  You notice blood collecting in the tissue around the catheter site (hematoma). The hematoma may be painful to the touch. Get help right away if:  You have severe pain at the catheter insertion area.  The catheter insertion area swells very fast.  The catheter insertion area is bleeding, and the bleeding does not stop when you hold steady pressure on the area.    The area near or just beyond the catheter insertion site becomes pale, cool, tingly, or numb. These symptoms may represent a serious problem that is an emergency. Do not wait to see if the symptoms will go away. Get medical help right away. Call your local emergency services (911 in the [place] Do not drive yourself to the hospital. Summary  After the procedure, it is common to have bruising and tenderness at the catheter insertion area.  After the procedure, it is important to rest and drink plenty of fluids.  Do not take baths,  swim, or use a hot tub until your health care provider says it is okay to do so. You may shower 24-48 hours after the procedure or as told by your health care provider.  If the catheter site starts bleeding, lie flat and put pressure on the site. If the bleeding does not stop, get help right away. This is a medical emergency. This information is not intended to replace advice given to you by your health care provider. Make sure you discuss any questions you have with your health care provider. Document Revised: 01/25/2017 Document Reviewed: 01/18/2016 Elsevier Patient Education  2020 [employer] Inc[employer]  

## 2019-11-16 NOTE — Op Note (Signed)
    Patient name: Dean Ellison MRN: [id number] DOB: 09/23/1955 Sex: male  11/16/2019 Pre-operative Diagnosis: claudication Post-operative diagnosis:  Neurogenic claudication Surgeon:  Erlene Quan C. Donzetta Matters, MD Procedure Performed: 1.  US guided cannulation of right common femoral artery 2. CO2 aortogram and limited bilateral lower extremity angiography 3. Cannulation of left common femoral artery and left lower extremity angiography with CO2 and contrast 4. Moderate sedation with fentanyl Versed for 70 minutes 5. Mynx device closure right common femoral artery   Indications: 64 year old male with history of transcarotid artery stenting on two separate occasions. He now has bilateral lower extremity hip and thigh claudication. He does have ultrasound evidence of occlusive disease he is okay for angiography possible intervention. He does have chronic kidney disease and CO2 will be necessary.  Findings: Aorta and iliac segments are free of flow-limiting stenosis. Bilaterally he has less than 50% stenosis of his SFAs. We did not evaluate for right lower extremity given chronic kidney disease. On the left lower extremity appears to have three-vessel runoff to the foot. No intervention was undertaken.   Procedure:  The patient was identified in the holding area and taken to room 8.  The patient was then placed supine on the table and prepped and draped in the usual sterile fashion.  A time out was called.  Ultrasound was used to evaluate the right common femoral artery. The artery was noted to be patent and compressible. The area was anesthetized 1% lidocaine cannulated with micropuncture needle followed by wire and sheath. And images saved the permanent record. Bentson wires placed followed by five [place] sheath. Omni catheter was placed at the level of L1 and CO2 aortogram and limited bilateral lower extremity angiography was performed. We crossed the bifurcation use a Bentson wire and Omni catheter  performed contrasted left lower extremity angiography. No intervention was undertaken. Catheter was removed over wire. Minx device was deployed. He tolerated procedure without any complication.   Contrast: 10 cc  Vu Liebman C. Donzetta Matters, MD [facility] Office: [phone] Pager: [phone]

## 2019-11-16 NOTE — H&P (Signed)
HPI: Dean Ellison is a 64 y.o. (18-Aug-1955) male who presents for follow up of peripheral arterial disease.  He was last seen on 09/09/19 at which time he was having claudication type symptoms. I saw him at this visit and scheduled him for non invasive arterial studies of his lower extremities. He returns today for that follow up.   He continues to have mostly thigh claudication symptoms. He says this has been present for several years now but only recently was he told by his PCP that he has " blockages in his legs". He says he mostly notices his symptoms when he is ambulating outside. He will develops muscle spasms or cramping in his right greater than left thigh and this will radiate into his hips however when asking him which leg is worse he says they are the same. If he stops and rests it will resolve but then it will occur again on continued ambulation. He does have some calf claudication but it is minimal. He denies any rest pain or non healing wounds. He explains that this is really limiting him and he feels he would be much more active if he did not have this leg discomfort  He is also s/p redo of right transcarotid artery stenting. This was done on 10/22/18 by Dr. Donzetta Matters Dr. Trula Slade for asymptomatic right carotid in stent restenosis. He has done well post operatively regarding his carotid stenosis. He remains without any symptoms of TIA or stroke.he denies any vision changes, slurred speech, facial drooping, weakness or numbness of upper or lower extremities.  The pt is on a statin for cholesterol management.  The pt is on a daily aspirin.   Other AC:  Xarelto The pt is on BB, ACE for hypertension.   The pt is diabetic.  Tobacco hx: current smoker, 4 cigs/ week on chantix      Past Medical History:  Diagnosis Date  . Anemia   . Arthritis   . CAD (coronary artery disease)   . Carotid artery occlusion   . COPD (chronic obstructive pulmonary disease) ([place])   . Diabetes  mellitus without complication ([place])   . Dyspnea   . Gout   . Hyperlipidemia   . Hypertension   . OSA (obstructive sleep apnea)    does not use CPAP  . Persistent disorder of initiating or maintaining sleep   . Presence of permanent cardiac pacemaker   . Vitamin D deficiency          Past Surgical History:  Procedure Laterality Date  . BACK SURGERY  1991  . CARDIAC CATHETERIZATION Right 2013  . CATARACT EXTRACTION Bilateral 1994  . CORONARY ARTERY BYPASS GRAFT  2013  . PACEMAKER INSERTION    . TRANSCAROTID ARTERY REVASCULARIZATION (TCAR) Right 10/22/2018   TRANSCAROTID ARTERY REVASCULARIZATION REDO (Right Neck)  . TRANSCAROTID ARTERY REVASCULARIZATION Right 07/18/2017   Procedure: TRANSCAROTID ARTERY REVASCULARIZATION;  Surgeon: Waynetta Sandy, MD;  Location: [facility];  Service: Vascular;  Laterality: Right;  . TRANSCAROTID ARTERY REVASCULARIZATION Right 10/22/2018   Procedure: TRANSCAROTID ARTERY REVASCULARIZATION REDO;  Surgeon: Serafina Mitchell, MD;  Location[facility] OR;  Service: Vascular;  Laterality: Right;    Social History        Socioeconomic History  . Marital status: Legally Separated    Spouse name: Not on file  . Number of children: Not on file  . Years of education: Not on file  . Highest education level: Not on file  Occupational History  . Not  on file  Tobacco Use  . Smoking status: Current Every Day Smoker    Packs/day: 0.25    Years: 40.00    Pack years: 10.00    Types: Cigarettes  . Smokeless tobacco: Never Used  . Tobacco comment: 4 cigarettes a week  Vaping Use  . Vaping Use: Former  Substance and Sexual Activity  . Alcohol use: Yes    Alcohol/week: 1.0 standard drink    Types: 1 Cans of beer per week    Comment: 1 every 6 months or more  . Drug use: No  . Sexual activity: Not on file  Other Topics Concern  . Not on file  Social History Narrative  . Not on file   Social Determinants of Health       Financial Resource Strain:   . Difficulty of Paying Living Expenses:   Food Insecurity:   . Worried About Charity fundraiser in the Last Year:   . Arboriculturist in the Last Year:   Transportation Needs:   . Film/video editor (Medical):   Marland Kitchen Lack of Transportation (Non-Medical):   Physical Activity:   . Days of Exercise per Week:   . Minutes of Exercise per Session:   Stress:   . Feeling of Stress :   Social Connections:   . Frequency of Communication with Friends and Family:   . Frequency of Social Gatherings with Friends and Family:   . Attends Religious Services:   . Active Member of Clubs or Organizations:   . Attends Archivist Meetings:   Marland Kitchen Marital Status:   Intimate Partner Violence:   . Fear of Current or Ex-Partner:   . Emotionally Abused:   Marland Kitchen Physically Abused:   . Sexually Abused:          Family History  Problem Relation Age of Onset  . Heart disease Mother   . Diabetes Mother   . Hypertension Mother   . Arthritis Mother   . Heart disease Father   . Diabetes Father   . Hypertension Father   . Arthritis Father     Current Outpatient Medications  Medication Sig Dispense Refill  . aspirin EC 81 MG tablet Take 81 mg by mouth daily.    Marland Kitchen atenolol (TENORMIN) 50 MG tablet Take 50 mg by mouth 2 (two) times daily.     Marland Kitchen atorvastatin (LIPITOR) 20 MG tablet Take 20 mg by mouth daily.    . Blood Glucose Monitoring Suppl (TRUE METRIX METER) w/Device KIT     . clopidogrel (PLAVIX) 75 MG tablet Take 75 mg by mouth daily.    . Dulaglutide 1.5 MG/0.5ML SOPN Inject 1.5 mg into the skin every Tuesday.     . empagliflozin (JARDIANCE) 10 MG TABS tablet Take 10 mg by mouth daily.     . famotidine (PEPCID) 20 MG tablet Take 20 mg by mouth daily.     . febuxostat (ULORIC) 40 MG tablet Take 40 mg by mouth daily.    . furosemide (LASIX) 20 MG tablet Take 20 mg by mouth daily.     Marland Kitchen glipiZIDE (GLUCOTROL) 5 MG tablet Take 5  mg by mouth daily.    Vanessa Kick Ethyl (VASCEPA) 1 g CAPS Take 1 g by mouth 2 (two) times daily.    Marland Kitchen latanoprost (XALATAN) 0.005 % ophthalmic solution Place 1 drop into both eyes at bedtime.  5  . lisinopril (ZESTRIL) 20 MG tablet Take 10 mg by mouth daily.     Marland Kitchen  nitroGLYCERIN (NITROSTAT) 0.4 MG SL tablet Place 0.4 mg under the tongue every 5 (five) minutes as needed for chest pain.    . sitaGLIPtin (JANUVIA) 100 MG tablet Take 100 mg by mouth daily.    Marland Kitchen tiotropium (SPIRIVA) 18 MCG inhalation capsule Place 18 mcg into inhaler and inhale daily.     . TRUE METRIX BLOOD GLUCOSE TEST test strip     . TRUEplus Lancets 33G MISC     . varenicline (CHANTIX) 1 MG tablet Take 1 mg by mouth 2 (two) times daily.    Alveda Reasons 2.5 MG TABS tablet Take 2.5 mg by mouth 2 (two) times daily.      No current facility-administered medications for this visit.         Allergies  Allergen Reactions  . Meperidine Other (See Comments)    Demerol. Pt stated "it made me crazy"     REVIEW OF SYSTEMS:  [X]  denotes positive finding, [ ]  denotes negative finding Cardiac  Comments:  Chest pain or chest pressure:    Shortness of breath upon exertion:    Short of breath when lying flat:    Irregular heart rhythm:        Vascular    Pain in calf, thigh, or hip brought on by ambulation: X   Pain in feet at night that wakes you up from your sleep:     Blood clot in your veins:    Leg swelling:         Pulmonary    Oxygen at home:    Productive cough:     Wheezing:         Neurologic    Sudden weakness in arms or legs:     Sudden numbness in arms or legs:     Sudden onset of difficulty speaking or slurred speech:    Temporary loss of vision in one eye:     Problems with dizziness:         Gastrointestinal    Blood in stool:     Vomited blood:         Genitourinary    Burning when urinating:     Blood in  urine:        Psychiatric    Major depression:         Hematologic    Bleeding problems:    Problems with blood clotting too easily:        Skin    Rashes or ulcers:        Constitutional    Fever or chills:      PHYSICAL EXAMINATION:  Vitals:   11/16/19 0708  BP: (!) 151/78  Pulse: 84  Resp: 14  Temp: 97.6 F (36.4 C)  SpO2: 100%     General:  WDWN in NAD; vital signs documented above Gait: Normal HENT: WNL, normocephalic Pulmonary: normal non-labored breathing , without wheezing Cardiac: regular HR, without  Murmurs without carotid bruit Abdomen: soft, NT, no masses Vascular Exam/Pulses:2+ radial pulses, 2+ femoral pulses bilaterally, 2+ popliteal pulses bilaterally, PT pulse on left, unable to palpate DP in either foot. Not able to palpate right PT Extremities: without ischemic changes, without Gangrene , without cellulitis; without open wounds; bilateral feet warm. Motor and sensation intact Musculoskeletal: no muscle wasting or atrophy       Neurologic: A&O X 3;  No focal weakness or paresthesias are detected Psychiatric:  The pt has Normal affect.   Non-Invasive Vascular Imaging:   +-------+-----------+-----------+------------+------------+  ABI/TBIToday's ABIToday's TBIPrevious ABIPrevious TBI  +-------+-----------+-----------+------------+------------+  Right 0.94    0.87    1.14    0.77      +-------+-----------+-----------+------------+------------+  Left  0.99    0.69    1.05    0.57      +-------+-----------+-----------+------------+------------+    +----------+--------+-----+---------------+---------+--------+  RIGHT   PSV cm/sRatioStenosis    Waveform Comments  +----------+--------+-----+---------------+---------+--------+  EIA Prox 93              biphasic       +----------+--------+-----+---------------+---------+--------+    EIA Mid  87              biphasic       +----------+--------+-----+---------------+---------+--------+  EIA Distal138              biphasic       +----------+--------+-----+---------------+---------+--------+  CFA Prox 142              biphasic       +----------+--------+-----+---------------+---------+--------+  CFA Distal92              triphasic      +----------+--------+-----+---------------+---------+--------+  DFA    49                         +----------+--------+-----+---------------+---------+--------+  SFA Prox 146              biphasic       +----------+--------+-----+---------------+---------+--------+  SFA Mid  224      30-49% stenosisbiphasic       +----------+--------+-----+---------------+---------+--------+  SFA Distal58              biphasic       +----------+--------+-----+---------------+---------+--------+  POP Prox 48              biphasic       +----------+--------+-----+---------------+---------+--------+  POP Distal36              biphasic       +----------+--------+-----+---------------+---------+--------+  ATA Distal35              biphasic       +----------+--------+-----+---------------+---------+--------+  PTA Distal45              biphasic       +----------+--------+-----+---------------+---------+--------+   A focal velocity elevation of 224 cm/s was obtained at [facility] SFA with a VR of 1.9. Findings are characteristic of 30-49% stenosis.     +----------+--------+-----+---------------+---------+--------+  LEFT   PSV cm/sRatioStenosis    Waveform Comments  +----------+--------+-----+---------------+---------+--------+  EIA Prox 125              triphasic       +----------+--------+-----+---------------+---------+--------+  EIA Mid  168              triphasic      +----------+--------+-----+---------------+---------+--------+  EIA Distal148              biphasic       +----------+--------+-----+---------------+---------+--------+  DFA    47              biphasic       +----------+--------+-----+---------------+---------+--------+  SFA Prox 97              biphasic       +----------+--------+-----+---------------+---------+--------+  SFA Mid  329      50-74% stenosisbiphasic       +----------+--------+-----+---------------+---------+--------+  SFA Distal59              biphasic       +----------+--------+-----+---------------+---------+--------+  POP Prox 59              biphasic       +----------+--------+-----+---------------+---------+--------+  POP Distal70              biphasic       +----------+--------+-----+---------------+---------+--------+  ATA Distal53              biphasic       +----------+--------+-----+---------------+---------+--------+  PTA Distal55              biphasic       +----------+--------+-----+---------------+---------+--------+   A focal velocity elevation of 329 cm/s was obtained at [facility] SFA with a VR of 3.4. Findings are characteristic of 50-74% stenosis.     ASSESSMENT/PLAN: 64 y.o. male here for follow up for peripheral arterial disease. Overall his ABI's have decreased slightly bilaterally, but essentially unchanged. His arterial duplex does show bilateral SFA lesions, left greater than right. Because of patients lifestyle limiting claudication I have recommended an Angiogram. In regard to his carotid disease his last duplex was stable and he remains asymptomatic. He will continue  his Aspirin and Statin - He will have his carotid duplex in 1 year - He is CKD stage III so this will likely need to be CO2 Arteriogram. I feel this should be okay as most of his disease appears to be in the SFA bilaterally - I discussed risks/ benefits of procedure including bleeding, arterial injury or dissection, thrombosis, or need for surgical intervention  Abdulkadir Emmanuel C. Donzetta Matters, MD [facility] Office: [phone] Pager: [phone]

## 2019-11-17 ENCOUNTER — Encounter (HOSPITAL_COMMUNITY): Payer: Self-pay | Admitting: Vascular Surgery

## 2020-04-26 ENCOUNTER — Ambulatory Visit
Admission: EM | Admit: 2020-04-26 | Discharge: 2020-04-26 | Disposition: A | Discharge status: Home / Self Care | Payer: Medicare HMO | Attending: Family Medicine | Admitting: Family Medicine

## 2020-04-26 ENCOUNTER — Ambulatory Visit: Payer: Self-pay

## 2020-04-26 ENCOUNTER — Encounter: Payer: Self-pay | Admitting: Emergency Medicine

## 2020-04-26 ENCOUNTER — Other Ambulatory Visit: Payer: Self-pay

## 2020-04-26 ENCOUNTER — Ambulatory Visit (INDEPENDENT_AMBULATORY_CARE_PROVIDER_SITE_OTHER): Payer: Medicare HMO

## 2020-04-26 DIAGNOSIS — J441 Chronic obstructive pulmonary disease with (acute) exacerbation: Secondary | ICD-10-CM | POA: Insufficient documentation

## 2020-04-26 DIAGNOSIS — R059 Cough, unspecified: Secondary | ICD-10-CM

## 2020-04-26 DIAGNOSIS — R0602 Shortness of breath: Secondary | ICD-10-CM

## 2020-04-26 DIAGNOSIS — U071 COVID-19: Secondary | ICD-10-CM | POA: Insufficient documentation

## 2020-04-26 DIAGNOSIS — Z20822 Contact with and (suspected) exposure to covid-19: Secondary | ICD-10-CM | POA: Diagnosis present

## 2020-04-26 MED ORDER — ALBUTEROL SULFATE HFA 108 (90 BASE) MCG/ACT IN AERS: 1.0000 | INHALATION_SPRAY | Freq: Four times a day (QID) | RESPIRATORY_TRACT | 0 refills | Status: AC | PRN

## 2020-04-26 MED ORDER — DOXYCYCLINE HYCLATE 100 MG PO CAPS: 100.0000 mg | ORAL_CAPSULE | Freq: Two times a day (BID) | ORAL | 0 refills | Status: AC

## 2020-04-26 MED ORDER — PREDNISONE 20 MG PO TABS: 20.0000 mg | ORAL_TABLET | Freq: Every day | ORAL | 0 refills | Status: AC

## 2020-04-26 NOTE — ED Triage Notes (Signed)
Pt c/o nasal congestion,headache, shortness of breath and cough. Cough started about 3 weeks ago and shortness of breath has gotten worse than his baseline about 3 days ago. Denies fever. He states his daughter and her family that he lives with all have tested positive for covid

## 2020-04-26 NOTE — ED Provider Notes (Signed)
[facility]    CSN: [id number] Arrival date & time: 04/26/20  1313      History   Chief Complaint Chief Complaint  Patient presents with  . Cough    HPI Dean Ellison is a 65 y.o. male presenting for 3-week history of nasal congestion, headaches, shortness of breath and cough. Patient admits to worsening cough and shortness of breath over the past 3-4 days. Patient states that his daughter and her family members who live in the same household as him recently tested positive for COVID-19. He has not been vaccinated for COVID-19.  He has not been taking any cough medication for symptoms.  He denies any fever, increased fatigue, body aches, chest pain or tightness, sore throat, abdominal pain, nausea/vomiting or diarrhea.  Patient does have history of COPD, CAD, diabetes, obstructive sleep apnea, atrial fibrillation, hypertension, and hyperlipidemia.  Patient uses Spiriva inhaler, but says he does not have a albuterol inhaler.  Patient has pacemaker.  Patient A. fib is treated with multiple medications including amiodarone, atenolol, and Xarelto.  Patient takes Trulicity and Jardiance for his diabetes.  He has no other concerns today.  Marland KitchenHPI  Past Medical History:  Diagnosis Date  . Anemia   . Arthritis   . CAD (coronary artery disease)   . Carotid artery occlusion   . COPD (chronic obstructive pulmonary disease) ([place])   . Diabetes mellitus without complication ([place])   . Dyspnea   . Gout   . Hyperlipidemia   . Hypertension   . OSA (obstructive sleep apnea)    does not use CPAP  . Persistent disorder of initiating or maintaining sleep   . Presence of permanent cardiac pacemaker   . Vitamin D deficiency     Patient Active Problem List   Diagnosis Date Noted  . Asymptomatic stenosis of right carotid artery without infarction 10/22/2018  . Presence of permanent cardiac pacemaker   . Vitamin D deficiency   . Persistent disorder of initiating or maintaining sleep    . OSA (obstructive sleep apnea)   . Hypertension   . Hyperlipidemia   . Gout   . Diabetes mellitus without complication ([place])   . COPD (chronic obstructive pulmonary disease) ([place])   . Carotid artery occlusion   . CAD (coronary artery disease)   . Arthritis   . Anemia   . Carotid artery stenosis, asymptomatic, right 07/18/2017    Past Surgical History:  Procedure Laterality Date  . ABDOMINAL AORTOGRAM W/LOWER EXTREMITY N/A 11/16/2019   Procedure: ABDOMINAL AORTOGRAM W/LOWER EXTREMITY;  Surgeon: Waynetta Sandy, MD;  Location: [facility];  Service: Cardiovascular;  Laterality: N/A;  Bilateral   . BACK SURGERY  1991  . CARDIAC CATHETERIZATION Right 2013  . CATARACT EXTRACTION Bilateral 1994  . CORONARY ARTERY BYPASS GRAFT  2013  . PACEMAKER INSERTION    . TRANSCAROTID ARTERY REVASCULARIZATION (TCAR) Right 10/22/2018   TRANSCAROTID ARTERY REVASCULARIZATION REDO (Right Neck)  . TRANSCAROTID ARTERY REVASCULARIZATION Right 07/18/2017   Procedure: TRANSCAROTID ARTERY REVASCULARIZATION;  Surgeon: Waynetta Sandy, MD;  Location: [facility];  Service: Vascular;  Laterality: Right;  . TRANSCAROTID ARTERY REVASCULARIZATION Right 10/22/2018   Procedure: TRANSCAROTID ARTERY REVASCULARIZATION REDO;  Surgeon: Serafina Mitchell, MD;  Location: [facility] OR;  Service: Vascular;  Laterality: Right;       Home Medications    Prior to Admission medications   Medication Sig Start Date End Date Taking? Authorizing Provider  albuterol (VENTOLIN HFA) 108 (90 Base) MCG/ACT inhaler Inhale  1-2 puffs into the lungs every 6 (six) hours as needed for wheezing or shortness of breath. 04/26/20 04/26/21 Yes Danton Clap, PA-C  amiodarone (PACERONE) 200 MG tablet Take 200 mg by mouth daily. 04/20/20  Yes [provider]  aspirin EC 81 MG tablet Take 81 mg by mouth daily.   Yes [provider]  atenolol (TENORMIN) 50 MG tablet Take 50 mg by mouth 2 (two) times daily.    Yes  [provider]  atorvastatin (LIPITOR) 40 MG tablet Take 40 mg by mouth daily.   Yes [provider]  doxycycline (VIBRAMYCIN) 100 MG capsule Take 1 capsule (100 mg total) by mouth 2 (two) times daily for 7 days. 04/26/20 05/03/20 Yes Danton Clap, PA-C  Dulaglutide (TRULICITY) 3 LS/9.3TD SOPN Inject 3 mg into the skin every Tuesday.   Yes [provider]  empagliflozin (JARDIANCE) 10 MG TABS tablet Take 10 mg by mouth daily.    Yes [provider]  famotidine (PEPCID) 20 MG tablet Take 20 mg by mouth daily.    Yes [provider]  febuxostat (ULORIC) 40 MG tablet Take 40 mg by mouth daily.   Yes [provider]  furosemide (LASIX) 20 MG tablet Take 20 mg by mouth daily.    Yes [provider]  glipiZIDE (GLUCOTROL) 5 MG tablet Take 5 mg by mouth daily. 09/22/18  Yes [provider]  Icosapent Ethyl (VASCEPA) 1 g CAPS Take 1 g by mouth 2 (two) times daily.   Yes [provider]  latanoprost (XALATAN) 0.005 % ophthalmic solution Place 1 drop into both eyes at bedtime. 07/04/17  Yes [provider]  lisinopril (ZESTRIL) 20 MG tablet Take 10 mg by mouth daily.    Yes [provider]  loratadine (CLARITIN) 10 MG tablet Take 10 mg by mouth daily as needed for allergies.   Yes [provider]  nitroGLYCERIN (NITROSTAT) 0.4 MG SL tablet Place 0.4 mg under the tongue every 5 (five) minutes as needed for chest pain.   Yes [provider]  predniSONE (DELTASONE) 20 MG tablet Take 1 tablet (20 mg total) by mouth daily for 5 days. 04/26/20 05/01/20 Yes Laurene Footman B, PA-C  sitaGLIPtin (JANUVIA) 100 MG tablet Take 100 mg by mouth daily.   Yes [provider]  tiotropium (SPIRIVA) 18 MCG inhalation capsule Place 18 mcg into inhaler and inhale daily.    Yes [provider]  TRUE METRIX BLOOD GLUCOSE TEST test strip  09/30/19  Yes [provider]  TRUEplus Lancets 33G [place]   09/30/19  Yes [provider]  varenicline (CHANTIX) 1 MG tablet Take 1 mg by mouth 2 (two) times daily.   Yes [provider]  XARELTO 2.5 MG TABS tablet Take 2.5 mg by mouth 2 (two) times daily.  04/12/18  Yes [provider]  Blood Glucose Monitoring Suppl (TRUE METRIX METER) w/Device KIT  09/30/19   [provider]    Family History Family History  Problem Relation Age of Onset  . Heart disease Mother   . Diabetes Mother   . Hypertension Mother   . Arthritis Mother   . Heart disease Father   . Diabetes Father   . Hypertension Father   . Arthritis Father     Social History Social History   Tobacco Use  . Smoking status: Current Every Day Smoker    Packs/day: 0.25    Years: 40.00    Pack years: 10.00  Types: Cigarettes  . Smokeless tobacco: Never Used  . Tobacco comment: 4 cigarettes a week  Vaping Use  . Vaping Use: Former  Substance Use Topics  . Alcohol use: Yes    Alcohol/week: 1.0 standard drink    Types: 1 Cans of beer per week    Comment: 1 every 6 months or more  . Drug use: No     Allergies   Meperidine   Review of Systems Review of Systems  Constitutional: Negative for fatigue and fever.  HENT: Positive for congestion and rhinorrhea. Negative for sinus pressure, sinus pain and sore throat.   Respiratory: Positive for cough and shortness of breath.   Cardiovascular: Negative for chest pain.  Gastrointestinal: Negative for abdominal pain, diarrhea, nausea and vomiting.  Musculoskeletal: Negative for myalgias.  Neurological: Positive for headaches. Negative for weakness and light-headedness.  Hematological: Negative for adenopathy.     Physical Exam Triage Vital Signs ED Triage Vitals  Enc Vitals Group     BP 04/26/20 1351 134/72     Pulse Rate 04/26/20 1351 70     Resp 04/26/20 1351 18     Temp 04/26/20 1351 99.5 F (37.5 C)     Temp Source 04/26/20 1351 Oral     SpO2 04/26/20 1351 96 %     Weight 04/26/20  1348 188 lb 0.8 oz (85.3 kg)     Height 04/26/20 1348 _0  (1.778 m)     Head Circumference --      Peak Flow --      Pain Score 04/26/20 1348 0     Pain Loc --      Pain Edu? --      Excl. in [place]? --    No data found.  Updated Vital Signs BP 134/72 (BP Location: Left Arm)   Pulse 70   Temp 99.5 F (37.5 C) (Oral)   Resp 18   Ht _1  (1.778 m)   Wt 188 lb 0.8 oz (85.3 kg)   SpO2 96%   BMI 26.98 kg/m   Physical Exam Vitals and nursing note reviewed.  Constitutional:      General: He is not in acute distress.    Appearance: Normal appearance. He is well-developed and well-nourished. He is not ill-appearing or diaphoretic.  HENT:     Head: Normocephalic and atraumatic.     Nose: Congestion and rhinorrhea present.     Mouth/Throat:     Mouth: Oropharynx is clear and moist and mucous membranes are normal. Mucous membranes are moist.     Pharynx: Oropharynx is clear. Uvula midline.     Tonsils: No tonsillar abscesses.  Eyes:     General: No scleral icterus.       Right eye: No discharge.        Left eye: No discharge.     Extraocular Movements: EOM normal.     Conjunctiva/sclera: Conjunctivae normal.     Pupils: Pupils are equal, round, and reactive to light.  Neck:     Thyroid: No thyromegaly.     Trachea: No tracheal deviation.  Cardiovascular:     Rate and Rhythm: Normal rate and regular rhythm.     Heart sounds: Normal heart sounds.  Pulmonary:     Effort: Pulmonary effort is normal. No respiratory distress.     Breath sounds: Normal breath sounds. No stridor. No wheezing, rhonchi or rales.  Musculoskeletal:     Cervical back: Normal range of motion and neck supple.  Lymphadenopathy:  Cervical: No cervical adenopathy.  Skin:    General: Skin is warm and dry.     Findings: No rash.  Neurological:     General: No focal deficit present.     Mental Status: He is alert. Mental status is at baseline.     Motor: No weakness.     Gait: Gait normal.   Psychiatric:        Mood and Affect: Mood normal.        Behavior: Behavior normal.        Thought Content: Thought content normal.      UC Treatments / Results  Labs (all labs ordered are listed, but only abnormal results are displayed) Labs Reviewed  SARS CORONAVIRUS 2 (TAT 6-24 HRS)    EKG   Radiology DG Chest 2 View  Result Date: 04/26/2020 CLINICAL DATA:  Shortness of breath, cough, nasal congestion, headache, COVID-19 exposure, worsening shortness of breath over 3 days EXAM: CHEST - 2 VIEW COMPARISON:  08/18/2018 FINDINGS: LEFT subclavian transvenous pacemaker leads project at RIGHT atrium and RIGHT ventricle. Normal heart size post CABG. Mediastinal contours and pulmonary vascularity normal. Atherosclerotic calcification aorta. Minimal chronic atelectasis versus scarring at LEFT base. Lungs otherwise clear. No acute infiltrate, pleural effusion, or pneumothorax. Bones demineralized. IMPRESSION: Post CABG and pacemaker. Minimal chronic atelectasis versus scarring at LEFT base. Electronically Signed   By: Lavonia Dana M.D.   On: 04/26/2020 14:36    Procedures Procedures (including critical care time)  Medications Ordered in UC Medications - No data to display  Initial Impression / Assessment and Plan / UC Course  I have reviewed the triage vital signs and the nursing notes.  Pertinent labs & imaging results that were available during my care of the patient were reviewed by me and considered in my medical decision making (see chart for details).   65 year old male with history of COPD presenting for cough, congestion and shortness of breath worsening over the past 3 to 4 days but ongoing x3 to 4 weeks.  Positive COVID-19 exposure.  Not vaccinated for COVID-19 and multiple risk factors for severe disease.  His vital signs are all normal and stable in the clinic.  He is overall well-appearing.  His chest essentially clear to auscultation.  He does have minor nasal  congestion.  A chest x-ray was obtained today which shows minimal chronic atelectasis versus scarring at the left lung base, otherwise normal.  Discussed results with patient and advised him that he does not appear to have pneumonia but since he has COPD will cover him for COPD exacerbation with prednisone 20 mg tablet x5 days, doxycycline 100 mg twice daily x7 days and ProAir as needed.  Also advised him to start over-the-counter guaifenesin.  Advised increasing rest and fluids.    Advised patient that Covid test will return tomorrow.  Current CDC guidelines, isolation protocol and ED precautions reviewed with patient.  Advised if he test positive he may be referred to the [facility] clinic for possible antiviral or antibody therapy.  Patient is agreeable to this.  Patient does mention voice hoarseness for about a year and says he has not had that checked out.  Advised him to speak with his PCP for referral to ENT specialist.  Patient states that he will do so.  Advised him to follow-up with our clinic as needed.  Final Clinical Impressions(s) / UC Diagnoses   Final diagnoses:  COPD exacerbation (HCC)  Cough  Shortness of breath  Exposure to COVID-19 virus  Discharge Instructions     Your chest x-ray does not show any evidence of pneumonia.  You are having an exacerbation of your COPD which may or may not be due to COVID-19.  The Covid test will be back tomorrow.  If you are positive we may refer you to the Covid clinic for further treatment.  At this time we are treating your exacerbation with prednisone.  This medication can increase your blood sugar little bit, but the benefits do outweigh the risks.  Do keep an eye on your blood sugar and if they are consistently over 300 or difficult to control you may need to stop the medication.  Call us if this happens or your PCP.  You can take plain Mucinex over-the-counter.  I have sent doxycycline to the pharmacy.  This is an antibiotic.  Make sure  you are increasing rest and fluids.  Talk with your primary doctor about a referral to ENT for your ongoing voice hoarseness.  You do need work-up for  this since it has been ongoing for over a year.  You have received COVID testing today either for positive exposure, concerning symptoms that could be related to COVID infection, screening purposes, or re-testing after confirmed positive.  Your test obtained today checks for active viral infection in the last 1-2 weeks. If your test is negative now, you can still test positive later. So, if you do develop symptoms you should either get re-tested and/or isolate x 5 days and then strict mask use x 5 days (unvaccinated) or mask use x 10 days (vaccinated). Please follow CDC guidelines.  While Rapid antigen tests come back in 15-20 minutes, send out PCR/molecular test results typically come back within 1-3 days. In the mean time, if you are symptomatic, assume this could be a positive test and treat/monitor yourself as if you do have COVID.   We will call with test results if positive. Please download the MyChart app and set up a profile to access test results.   If symptomatic, go home and rest. Push fluids. Take Tylenol as needed for discomfort. Gargle warm salt water. Throat lozenges. Take Mucinex DM or Robitussin for cough. Humidifier in bedroom to ease coughing. Warm showers. Also review the COVID handout for more information.  COVID-19 INFECTION: The incubation period of COVID-19 is approximately 14 days after exposure, with most symptoms developing in roughly 4-5 days. Symptoms may range in severity from mild to critically severe. Roughly 80% of those infected will have mild symptoms. People of any age may become infected with COVID-19 and have the ability to transmit the virus. The most common symptoms include: fever, fatigue, cough, body aches, headaches, sore throat, nasal congestion, shortness of breath, nausea, vomiting, diarrhea, changes in smell  and/or taste.    COURSE OF ILLNESS Some patients may begin with mild disease which can progress quickly into critical symptoms. If your symptoms are worsening please call ahead to the Emergency Department and proceed there for further treatment. Recovery time appears to be roughly 1-2 weeks for mild symptoms and 3-6 weeks for severe disease.   GO IMMEDIATELY TO ER FOR FEVER YOU ARE UNABLE TO GET DOWN WITH TYLENOL, BREATHING PROBLEMS, CHEST PAIN, FATIGUE, LETHARGY, INABILITY TO EAT OR DRINK, ETC  QUARANTINE AND ISOLATION: To help decrease the spread of COVID-19 please remain isolated if you have COVID infection or are highly suspected to have COVID infection. This means -stay home and isolate to one room in the home if you live with  others. Do not share a bed or bathroom with others while ill, sanitize and wipe down all countertops and keep common areas clean and disinfected. Stay home for 5 days. If you have no symptoms or your symptoms are resolving after 5 days, you can leave your house. Continue to wear a mask around others for 5 additional days. If you have been in close contact (within 6 feet) of someone diagnosed with COVID 19, you are advised to quarantine in your home for 14 days as symptoms can develop anywhere from 2-14 days after exposure to the virus. If you develop symptoms, you  must isolate.  Most current guidelines for COVID after exposure -unvaccinated: isolate 5 days and strict mask use x 5 days. Test on day 5 is possible -vaccinated: wear mask x 10 days if symptoms do not develop -You do not necessarily need to be tested for COVID if you have + exposure and  develop symptoms. Just isolate at home x10 days from symptom onset During this global pandemic, CDC advises to practice social distancing, try to stay at least 44f away from others at all times. Wear a face covering. Wash and sanitize your hands regularly and avoid going anywhere that is not necessary.  KEEP IN MIND THAT THE  COVID TEST IS NOT 100% ACCURATE AND YOU SHOULD STILL DO EVERYTHING TO PREVENT POTENTIAL SPREAD OF VIRUS TO OTHERS (WEAR MASK, WEAR GLOVES, [place] AND SANITIZE REGULARLY). IF INITIAL TEST IS NEGATIVE, THIS MAY NOT MEAN YOU ARE DEFINITELY NEGATIVE. MOST ACCURATE TESTING IS DONE 5-7 DAYS AFTER EXPOSURE.   It is not advised by CDC to get re-tested after receiving a positive COVID test since you can still test positive for weeks to months after you have already cleared the virus.   *If you have not been vaccinated for COVID, I strongly suggest you consider getting vaccinated as long as there are no contraindications.      ED Prescriptions    Medication Sig Dispense Auth. Provider   predniSONE (DELTASONE) 20 MG tablet Take 1 tablet (20 mg total) by mouth daily for 5 days. 5 tablet ELaurene FootmanB, PA-C   doxycycline (VIBRAMYCIN) 100 MG capsule Take 1 capsule (100 mg total) by mouth 2 (two) times daily for 7 days. 14 capsule ELaurene FootmanB, PA-C   albuterol (VENTOLIN HFA) 108 (90 Base) MCG/ACT inhaler Inhale 1-2 puffs into the lungs every 6 (six) hours as needed for wheezing or shortness of breath. 1 g EDanton Clap PA-C     PDMP not reviewed this encounter.   EDanton Clap PA-C 04/26/20 [id number]

## 2020-04-26 NOTE — Discharge Instructions (Signed)
Your chest x-ray does not show any evidence of pneumonia.  You are having an exacerbation of your COPD which may or may not be due to COVID-19.  The Covid test will be back tomorrow.  If you are positive we may refer you to the Covid clinic for further treatment.  At this time we are treating your exacerbation with prednisone.  This medication can increase your blood sugar little bit, but the benefits do outweigh the risks.  Do keep an eye on your blood sugar and if they are consistently over 300 or difficult to control you may need to stop the medication.  Call us if this happens or your PCP.  You can take plain Mucinex over-the-counter.  I have sent doxycycline to the pharmacy.  This is an antibiotic.  Make sure you are increasing rest and fluids.  Talk with your primary doctor about a referral to ENT for your ongoing voice hoarseness.  You do need work-up for  this since it has been ongoing for over a year.  You have received COVID testing today either for positive exposure, concerning symptoms that could be related to COVID infection, screening purposes, or re-testing after confirmed positive.  Your test obtained today checks for active viral infection in the last 1-2 weeks. If your test is negative now, you can still test positive later. So, if you do develop symptoms you should either get re-tested and/or isolate x 5 days and then strict mask use x 5 days (unvaccinated) or mask use x 10 days (vaccinated). Please follow CDC guidelines.  While Rapid antigen tests come back in 15-20 minutes, send out PCR/molecular test results typically come back within 1-3 days. In the mean time, if you are symptomatic, assume this could be a positive test and treat/monitor yourself as if you do have COVID.   We will call with test results if positive. Please download the MyChart app and set up a profile to access test results.   If symptomatic, go home and rest. Push fluids. Take Tylenol as needed for discomfort.  Gargle warm salt water. Throat lozenges. Take Mucinex DM or Robitussin for cough. Humidifier in bedroom to ease coughing. Warm showers. Also review the COVID handout for more information.  COVID-19 INFECTION: The incubation period of COVID-19 is approximately 14 days after exposure, with most symptoms developing in roughly 4-5 days. Symptoms may range in severity from mild to critically severe. Roughly 80% of those infected will have mild symptoms. People of any age may become infected with COVID-19 and have the ability to transmit the virus. The most common symptoms include: fever, fatigue, cough, body aches, headaches, sore throat, nasal congestion, shortness of breath, nausea, vomiting, diarrhea, changes in smell and/or taste.    COURSE OF ILLNESS Some patients may begin with mild disease which can progress quickly into critical symptoms. If your symptoms are worsening please call ahead to the Emergency Department and proceed there for further treatment. Recovery time appears to be roughly 1-2 weeks for mild symptoms and 3-6 weeks for severe disease.   GO IMMEDIATELY TO ER FOR FEVER YOU ARE UNABLE TO GET DOWN WITH TYLENOL, BREATHING PROBLEMS, CHEST PAIN, FATIGUE, LETHARGY, INABILITY TO EAT OR DRINK, ETC  QUARANTINE AND ISOLATION: To help decrease the spread of COVID-19 please remain isolated if you have COVID infection or are highly suspected to have COVID infection. This means -stay home and isolate to one room in the home if you live with others. Do not share a bed or  bathroom with others while ill, sanitize and wipe down all countertops and keep common areas clean and disinfected. Stay home for 5 days. If you have no symptoms or your symptoms are resolving after 5 days, you can leave your house. Continue to wear a mask around others for 5 additional days. If you have been in close contact (within 6 feet) of someone diagnosed with COVID 19, you are advised to quarantine in your home for 14 days as  symptoms can develop anywhere from 2-14 days after exposure to the virus. If you develop symptoms, you  must isolate.  Most current guidelines for COVID after exposure -unvaccinated: isolate 5 days and strict mask use x 5 days. Test on day 5 is possible -vaccinated: wear mask x 10 days if symptoms do not develop -You do not necessarily need to be tested for COVID if you have + exposure and  develop symptoms. Just isolate at home x10 days from symptom onset During this global pandemic, CDC advises to practice social distancing, try to stay at least 71ft away from others at all times. Wear a face covering. Wash and sanitize your hands regularly and avoid going anywhere that is not necessary.  KEEP IN MIND THAT THE COVID TEST IS NOT 100% ACCURATE AND YOU SHOULD STILL DO EVERYTHING TO PREVENT POTENTIAL SPREAD OF VIRUS TO OTHERS (WEAR MASK, WEAR GLOVES, [place] HANDS AND SANITIZE REGULARLY). IF INITIAL TEST IS NEGATIVE, THIS MAY NOT MEAN YOU ARE DEFINITELY NEGATIVE. MOST ACCURATE TESTING IS DONE 5-7 DAYS AFTER EXPOSURE.   It is not advised by CDC to get re-tested after receiving a positive COVID test since you can still test positive for weeks to months after you have already cleared the virus.   *If you have not been vaccinated for COVID, I strongly suggest you consider getting vaccinated as long as there are no contraindications.

## 2020-04-27 LAB — SARS CORONAVIRUS 2 (TAT 6-24 HRS): SARS Coronavirus 2: POSITIVE — AB

## 2020-04-28 ENCOUNTER — Other Ambulatory Visit: Payer: Self-pay | Admitting: Physician Assistant

## 2020-04-28 ENCOUNTER — Telehealth: Payer: Self-pay

## 2020-04-28 DIAGNOSIS — I2510 Atherosclerotic heart disease of native coronary artery without angina pectoris: Secondary | ICD-10-CM

## 2020-04-28 DIAGNOSIS — I10 Essential (primary) hypertension: Secondary | ICD-10-CM

## 2020-04-28 DIAGNOSIS — E119 Type 2 diabetes mellitus without complications: Secondary | ICD-10-CM

## 2020-04-28 DIAGNOSIS — U071 COVID-19: Secondary | ICD-10-CM

## 2020-04-28 DIAGNOSIS — J449 Chronic obstructive pulmonary disease, unspecified: Secondary | ICD-10-CM

## 2020-04-28 DIAGNOSIS — E663 Overweight: Secondary | ICD-10-CM

## 2020-04-28 NOTE — Progress Notes (Signed)
I connected by phone with Dean Ellison on 04/28/2020 at 1:33 PM to discuss the potential use of a new treatment for mild to moderate COVID-19 viral infection in non-hospitalized patients.  This patient is a 65 y.o. male that meets the FDA criteria for Emergency Use Authorization of COVID monoclonal antibody sotrovimab.  Has a (+) direct SARS-CoV-2 viral test result  Has mild or moderate COVID-19   Is NOT hospitalized due to COVID-19  Is within 10 days of symptom onset  Has at least one of the high risk factor(s) for progression to severe COVID-19 and/or hospitalization as defined in EUA.  Specific high risk criteria : BMI > 25, Diabetes, Cardiovascular disease or hypertension, Chronic Lung Disease and Other high risk medical condition per CDC:  unvaccinated, high SVI   I have spoken and communicated the following to the patient or parent/caregiver regarding COVID monoclonal antibody treatment:  1. FDA has authorized the emergency use for the treatment of mild to moderate COVID-19 in adults and pediatric patients with positive results of direct SARS-CoV-2 viral testing who are 26 years of age and older weighing at least 40 kg, and who are at high risk for progressing to severe COVID-19 and/or hospitalization.  2. The significant known and potential risks and benefits of COVID monoclonal antibody, and the extent to which such potential risks and benefits are unknown.  3. Information on available alternative treatments and the risks and benefits of those alternatives, including clinical trials.  4. Patients treated with COVID monoclonal antibody should continue to self-isolate and use infection control measures (e.g., wear mask, isolate, social distance, avoid sharing personal items, clean and disinfect "high touch" surfaces, and frequent handwashing) according to CDC guidelines.   5. The patient or parent/caregiver has the option to accept or refuse COVID monoclonal antibody  treatment.  After reviewing this information with the patient, the patient has agreed to receive one of the available covid 19 monoclonal antibodies and will be provided an appropriate fact sheet prior to infusion.   Pt is unvaccinated. Set up for infusion 3/4 @ 11:30am  Angelena Form, PA-C 04/28/2020 1:33 PM

## 2020-04-28 NOTE — Telephone Encounter (Signed)
Called to discuss with patient about COVID-19 symptoms and the use of one of the available treatments for those with mild to moderate Covid symptoms and at a high risk of hospitalization.  Pt appears to qualify for outpatient treatment due to co-morbid conditions and/or a member of an at-risk group in accordance with the FDA Emergency Use Authorization.    Symptom onset: 04/22/20 Vaccinated: No Booster? No Immunocompromised? No Qualifiers: COPD,CAD,HTN,DM,pacemaker  Pt. Would like to speak with APP.  Marcello Moores

## 2020-04-29 ENCOUNTER — Ambulatory Visit (HOSPITAL_COMMUNITY)
Admission: RE | Admit: 2020-04-29 | Discharge: 2020-04-29 | Disposition: A | Discharge status: Home / Self Care | Payer: Medicare HMO | Source: Ambulatory Visit | Attending: Pulmonary Disease | Admitting: Pulmonary Disease

## 2020-04-29 DIAGNOSIS — J449 Chronic obstructive pulmonary disease, unspecified: Secondary | ICD-10-CM | POA: Insufficient documentation

## 2020-04-29 DIAGNOSIS — I2583 Coronary atherosclerosis due to lipid rich plaque: Secondary | ICD-10-CM | POA: Insufficient documentation

## 2020-04-29 DIAGNOSIS — U071 COVID-19: Secondary | ICD-10-CM | POA: Insufficient documentation

## 2020-04-29 DIAGNOSIS — I10 Essential (primary) hypertension: Secondary | ICD-10-CM | POA: Diagnosis not present

## 2020-04-29 DIAGNOSIS — I2510 Atherosclerotic heart disease of native coronary artery without angina pectoris: Secondary | ICD-10-CM

## 2020-04-29 DIAGNOSIS — E663 Overweight: Secondary | ICD-10-CM

## 2020-04-29 DIAGNOSIS — E119 Type 2 diabetes mellitus without complications: Secondary | ICD-10-CM | POA: Diagnosis not present

## 2020-04-29 MED ORDER — SODIUM CHLORIDE 0.9 % IV SOLN: INTRAVENOUS | Status: DC | PRN

## 2020-04-29 MED ORDER — ALBUTEROL SULFATE HFA 108 (90 BASE) MCG/ACT IN AERS: 2.0000 | INHALATION_SPRAY | Freq: Once | RESPIRATORY_TRACT | Status: DC | PRN

## 2020-04-29 MED ORDER — SOTROVIMAB 500 MG/8ML IV SOLN
500.0000 mg | Freq: Once | INTRAVENOUS | Status: AC
  Administered 2020-04-29: 500 mg via INTRAVENOUS

## 2020-04-29 MED ORDER — FAMOTIDINE IN NACL 20-0.9 MG/50ML-% IV SOLN: 20.0000 mg | Freq: Once | INTRAVENOUS | Status: DC | PRN

## 2020-04-29 MED ORDER — EPINEPHRINE 0.3 MG/0.3ML IJ SOAJ: 0.3000 mg | Freq: Once | INTRAMUSCULAR | Status: DC | PRN

## 2020-04-29 MED ORDER — METHYLPREDNISOLONE SODIUM SUCC 125 MG IJ SOLR: 125.0000 mg | Freq: Once | INTRAMUSCULAR | Status: DC | PRN

## 2020-04-29 MED ORDER — DIPHENHYDRAMINE HCL 50 MG/ML IJ SOLN: 50.0000 mg | Freq: Once | INTRAMUSCULAR | Status: DC | PRN

## 2020-04-29 NOTE — Progress Notes (Addendum)
Patient reviewed Fact Sheet for Patients, Parents, and Caregivers for Emergency Use Authorization (EUA) of sotrovimab for the Treatment of Coronavirus. Patient also reviewed and is agreeable to the estimated cost of treatment. Patient is agreeable to proceed.     1140 Pt's BP is high, 179/95. RN called Geralyn Flash, NP. Informed NP pt did not take his AM BP meds. Pt denies headache, dizziness, fatigue, SOB. RN advised pt to take BP meds when he gets home. Pt understands without assistance. WCTM.

## 2020-04-29 NOTE — Discharge Instructions (Signed)

## 2020-06-18 ENCOUNTER — Encounter (HOSPITAL_COMMUNITY): Payer: Self-pay | Admitting: Emergency Medicine

## 2020-06-18 ENCOUNTER — Observation Stay (HOSPITAL_COMMUNITY)
Admission: EM | Admit: 2020-06-18 | Discharge: 2020-06-19 | Disposition: A | Discharge status: Home / Self Care | Payer: Medicare HMO | Attending: Emergency Medicine | Admitting: Emergency Medicine

## 2020-06-18 ENCOUNTER — Emergency Department (HOSPITAL_COMMUNITY): Payer: Medicare HMO

## 2020-06-18 ENCOUNTER — Other Ambulatory Visit: Payer: Self-pay

## 2020-06-18 DIAGNOSIS — E1122 Type 2 diabetes mellitus with diabetic chronic kidney disease: Secondary | ICD-10-CM | POA: Insufficient documentation

## 2020-06-18 DIAGNOSIS — J449 Chronic obstructive pulmonary disease, unspecified: Secondary | ICD-10-CM | POA: Insufficient documentation

## 2020-06-18 DIAGNOSIS — I2510 Atherosclerotic heart disease of native coronary artery without angina pectoris: Secondary | ICD-10-CM | POA: Diagnosis not present

## 2020-06-18 DIAGNOSIS — J189 Pneumonia, unspecified organism: Secondary | ICD-10-CM

## 2020-06-18 DIAGNOSIS — Z7982 Long term (current) use of aspirin: Secondary | ICD-10-CM | POA: Insufficient documentation

## 2020-06-18 DIAGNOSIS — Z20822 Contact with and (suspected) exposure to covid-19: Secondary | ICD-10-CM | POA: Diagnosis not present

## 2020-06-18 DIAGNOSIS — I129 Hypertensive chronic kidney disease with stage 1 through stage 4 chronic kidney disease, or unspecified chronic kidney disease: Secondary | ICD-10-CM | POA: Insufficient documentation

## 2020-06-18 DIAGNOSIS — Z7984 Long term (current) use of oral hypoglycemic drugs: Secondary | ICD-10-CM | POA: Diagnosis not present

## 2020-06-18 DIAGNOSIS — G4733 Obstructive sleep apnea (adult) (pediatric): Secondary | ICD-10-CM | POA: Diagnosis present

## 2020-06-18 DIAGNOSIS — I480 Paroxysmal atrial fibrillation: Secondary | ICD-10-CM | POA: Insufficient documentation

## 2020-06-18 DIAGNOSIS — N183 Chronic kidney disease, stage 3 unspecified: Secondary | ICD-10-CM | POA: Diagnosis not present

## 2020-06-18 DIAGNOSIS — A419 Sepsis, unspecified organism: Secondary | ICD-10-CM | POA: Diagnosis not present

## 2020-06-18 DIAGNOSIS — Z7901 Long term (current) use of anticoagulants: Secondary | ICD-10-CM | POA: Diagnosis not present

## 2020-06-18 DIAGNOSIS — Z951 Presence of aortocoronary bypass graft: Secondary | ICD-10-CM | POA: Insufficient documentation

## 2020-06-18 DIAGNOSIS — J188 Other pneumonia, unspecified organism: Secondary | ICD-10-CM | POA: Insufficient documentation

## 2020-06-18 DIAGNOSIS — F17210 Nicotine dependence, cigarettes, uncomplicated: Secondary | ICD-10-CM | POA: Diagnosis not present

## 2020-06-18 DIAGNOSIS — E119 Type 2 diabetes mellitus without complications: Secondary | ICD-10-CM

## 2020-06-18 DIAGNOSIS — Z79899 Other long term (current) drug therapy: Secondary | ICD-10-CM | POA: Insufficient documentation

## 2020-06-18 DIAGNOSIS — I10 Essential (primary) hypertension: Secondary | ICD-10-CM | POA: Diagnosis present

## 2020-06-18 DIAGNOSIS — R509 Fever, unspecified: Secondary | ICD-10-CM | POA: Diagnosis present

## 2020-06-18 LAB — URINALYSIS, ROUTINE W REFLEX MICROSCOPIC
Bacteria, UA: NONE SEEN
Bilirubin Urine: NEGATIVE
Glucose, UA: >=500 mg/dL — AB
Ketones, ur: NEGATIVE mg/dL
Leukocytes,Ua: NEGATIVE
Nitrite: NEGATIVE
Protein, ur: >=300 mg/dL — AB
Specific Gravity, Urine: 1.027 (ref 1.005–1.030)
pH: 5 (ref 5.0–8.0)

## 2020-06-18 LAB — BASIC METABOLIC PANEL
Anion gap: 14 (ref 5–15)
BUN: 20 mg/dL (ref 8–23)
CO2: 23 mmol/L (ref 22–32)
Calcium: 9.1 mg/dL (ref 8.9–10.3)
Chloride: 95 mmol/L — ABNORMAL LOW (ref 98–111)
Creatinine, Ser: 1.93 mg/dL — ABNORMAL HIGH (ref 0.61–1.24)
GFR, Estimated: 38 mL/min — ABNORMAL LOW (ref >60)
Glucose, Bld: 304 mg/dL — ABNORMAL HIGH (ref 70–99)
Potassium: 4.5 mmol/L (ref 3.5–5.1)
Sodium: 132 mmol/L — ABNORMAL LOW (ref 135–145)

## 2020-06-18 LAB — HEPATIC FUNCTION PANEL
ALT: 17 U/L (ref 0–44)
AST: 23 U/L (ref 15–41)
Albumin: 3.3 g/dL — ABNORMAL LOW (ref 3.5–5.0)
Alkaline Phosphatase: 58 U/L (ref 38–126)
Bilirubin, Direct: 0.2 mg/dL (ref 0.0–0.2)
Indirect Bilirubin: 0.5 mg/dL (ref 0.3–0.9)
Total Bilirubin: 0.7 mg/dL (ref 0.3–1.2)
Total Protein: 6.3 g/dL — ABNORMAL LOW (ref 6.5–8.1)

## 2020-06-18 LAB — CBC
HCT: 44 % (ref 39.0–52.0)
Hemoglobin: 14.1 g/dL (ref 13.0–17.0)
MCH: 29.6 pg (ref 26.0–34.0)
MCHC: 32 g/dL (ref 30.0–36.0)
MCV: 92.2 fL (ref 80.0–100.0)
Platelets: 197 10*3/uL (ref 150–400)
RBC: 4.77 MIL/uL (ref 4.22–5.81)
RDW: 14.2 % (ref 11.5–15.5)
WBC: 16.4 10*3/uL — ABNORMAL HIGH (ref 4.0–10.5)
nRBC: 0 % (ref 0.0–0.2)

## 2020-06-18 LAB — LACTIC ACID, PLASMA
Lactic Acid, Venous: 4.3 mmol/L (ref 0.5–1.9)
Lactic Acid, Venous: 3.1 mmol/L (ref 0.5–1.9)
Lactic Acid, Venous: 3.3 mmol/L (ref 0.5–1.9)

## 2020-06-18 LAB — RESP PANEL BY RT-PCR (FLU A&B, COVID) ARPGX2
Influenza A by PCR: NEGATIVE
Influenza B by PCR: NEGATIVE
SARS Coronavirus 2 by RT PCR: NEGATIVE

## 2020-06-18 LAB — CK: Total CK: 97 U/L (ref 49–397)

## 2020-06-18 LAB — CBG MONITORING, ED
Glucose-Capillary: 305 mg/dL — ABNORMAL HIGH (ref 70–99)
Glucose-Capillary: 236 mg/dL — ABNORMAL HIGH (ref 70–99)

## 2020-06-18 LAB — TROPONIN I (HIGH SENSITIVITY): Troponin I (High Sensitivity): 14 ng/L (ref <18)

## 2020-06-18 MED ORDER — LACTATED RINGERS IV BOLUS (SEPSIS)
1000.0000 mL | Freq: Once | INTRAVENOUS | Status: AC
  Administered 2020-06-18: 1000 mL via INTRAVENOUS

## 2020-06-18 MED ORDER — FAMOTIDINE 20 MG PO TABS
20.0000 mg | ORAL_TABLET | Freq: Every day | ORAL | Status: DC
  Administered 2020-06-19: 20 mg via ORAL
  Filled 2020-06-18: qty 1

## 2020-06-18 MED ORDER — DOXYCYCLINE HYCLATE 100 MG PO TABS
100.0000 mg | ORAL_TABLET | Freq: Once | ORAL | Status: AC
  Administered 2020-06-18: 100 mg via ORAL
  Filled 2020-06-18: qty 1

## 2020-06-18 MED ORDER — ACETAMINOPHEN 650 MG RE SUPP: 650.0000 mg | Freq: Four times a day (QID) | RECTAL | Status: DC | PRN

## 2020-06-18 MED ORDER — ALBUTEROL SULFATE HFA 108 (90 BASE) MCG/ACT IN AERS
1.0000 | INHALATION_SPRAY | Freq: Four times a day (QID) | RESPIRATORY_TRACT | Status: DC | PRN
  Filled 2020-06-18: qty 6.7

## 2020-06-18 MED ORDER — AMIODARONE HCL 200 MG PO TABS
200.0000 mg | ORAL_TABLET | Freq: Every day | ORAL | Status: DC
  Administered 2020-06-19: 200 mg via ORAL
  Filled 2020-06-18: qty 1

## 2020-06-18 MED ORDER — LACTATED RINGERS IV SOLN: INTRAVENOUS | Status: DC

## 2020-06-18 MED ORDER — ONDANSETRON HCL 4 MG/2ML IJ SOLN: 4.0000 mg | Freq: Four times a day (QID) | INTRAMUSCULAR | Status: DC | PRN

## 2020-06-18 MED ORDER — UMECLIDINIUM BROMIDE 62.5 MCG/INH IN AEPB
1.0000 | INHALATION_SPRAY | Freq: Every day | RESPIRATORY_TRACT | Status: DC
  Filled 2020-06-18 (×2): qty 7

## 2020-06-18 MED ORDER — SODIUM CHLORIDE 0.9 % IV SOLN
1.0000 g | INTRAVENOUS | Status: DC
  Filled 2020-06-18: qty 10

## 2020-06-18 MED ORDER — ACETAMINOPHEN 325 MG PO TABS: 650.0000 mg | ORAL_TABLET | Freq: Four times a day (QID) | ORAL | Status: DC | PRN

## 2020-06-18 MED ORDER — INSULIN ASPART 100 UNIT/ML ~~LOC~~ SOLN: 0.0000 [IU] | Freq: Every day | SUBCUTANEOUS | Status: DC

## 2020-06-18 MED ORDER — AZITHROMYCIN 500 MG PO TABS: 500.0000 mg | ORAL_TABLET | Freq: Every day | ORAL | Status: DC

## 2020-06-18 MED ORDER — ONDANSETRON HCL 4 MG PO TABS: 4.0000 mg | ORAL_TABLET | Freq: Four times a day (QID) | ORAL | Status: DC | PRN

## 2020-06-18 MED ORDER — INSULIN ASPART 100 UNIT/ML ~~LOC~~ SOLN
0.0000 [IU] | Freq: Three times a day (TID) | SUBCUTANEOUS | Status: DC
  Administered 2020-06-19: 11 [IU] via SUBCUTANEOUS

## 2020-06-18 MED ORDER — SODIUM CHLORIDE 0.9 % IV SOLN
1.0000 g | Freq: Once | INTRAVENOUS | Status: AC
  Administered 2020-06-18: 1 g via INTRAVENOUS
  Filled 2020-06-18: qty 10

## 2020-06-18 MED ORDER — ASPIRIN EC 81 MG PO TBEC
81.0000 mg | DELAYED_RELEASE_TABLET | Freq: Every day | ORAL | Status: DC
  Administered 2020-06-19: 81 mg via ORAL
  Filled 2020-06-18: qty 1

## 2020-06-18 MED ORDER — ATORVASTATIN CALCIUM 40 MG PO TABS
40.0000 mg | ORAL_TABLET | Freq: Every day | ORAL | Status: DC
  Administered 2020-06-19: 40 mg via ORAL
  Filled 2020-06-18: qty 1

## 2020-06-18 MED ORDER — RIVAROXABAN 2.5 MG PO TABS
2.5000 mg | ORAL_TABLET | Freq: Two times a day (BID) | ORAL | Status: DC
  Administered 2020-06-18 – 2020-06-19 (×2): 2.5 mg via ORAL
  Filled 2020-06-18 (×3): qty 1

## 2020-06-18 MED ORDER — LORATADINE 10 MG PO TABS: 10.0000 mg | ORAL_TABLET | Freq: Every day | ORAL | Status: DC | PRN

## 2020-06-18 NOTE — ED Provider Notes (Signed)
[facility] EMERGENCY DEPARTMENT Provider Note   CSN: [id number] Arrival date & time: 06/18/20  1301     History Chief Complaint  Patient presents with  . Hyperglycemia  . Weakness    Dean Ellison is a 65 y.o. male.  65 year old male with past medical history below including CAD, OSA, HTN, HLD, T2DM who p/w fever and fatigue.  The patient had COVID-19 infection at the beginning of March and had monoclonal antibody infusion as an outpatient.  He reports he had a complete recovery and felt back to normal.  Family member notes that about a week ago, they were together and patient had been complaining of several days of fatigue and not feeling well.  Today he has had fever, headache, body aches, and malaise.  He is unsure when fevers started.  He reports some mild low back pain but does have a history of low back surgery.  He had some vomiting this morning, no associated diarrhea.  No urinary symptoms.  He has a slight cough but no severe cough and denies any new shortness of breath.  He denies chest pain.  Family member notes that patient was around grandchildren who have had a viral illness recently. Family has not noticed any confusion but did note he did not want to get out of bed this morning and was weak.  The history is provided by the patient.  Hyperglycemia Associated symptoms: weakness   Weakness      Past Medical History:  Diagnosis Date  . Anemia   . Arthritis   . CAD (coronary artery disease)   . Carotid artery occlusion   . COPD (chronic obstructive pulmonary disease) ([place])   . Diabetes mellitus without complication ([place])   . Dyspnea   . Gout   . Hyperlipidemia   . Hypertension   . OSA (obstructive sleep apnea)    does not use CPAP  . Persistent disorder of initiating or maintaining sleep   . Presence of permanent cardiac pacemaker   . Vitamin D deficiency     Patient Active Problem List   Diagnosis Date Noted  . Asymptomatic stenosis of  right carotid artery without infarction 10/22/2018  . Presence of permanent cardiac pacemaker   . Vitamin D deficiency   . Persistent disorder of initiating or maintaining sleep   . OSA (obstructive sleep apnea)   . Hypertension   . Hyperlipidemia   . Gout   . Diabetes mellitus without complication ([place])   . COPD (chronic obstructive pulmonary disease) ([place])   . Carotid artery occlusion   . CAD (coronary artery disease)   . Arthritis   . Anemia   . Carotid artery stenosis, asymptomatic, right 07/18/2017    Past Surgical History:  Procedure Laterality Date  . ABDOMINAL AORTOGRAM W/LOWER EXTREMITY N/A 11/16/2019   Procedure: ABDOMINAL AORTOGRAM W/LOWER EXTREMITY;  Surgeon: Waynetta Sandy, MD;  Location: [facility];  Service: Cardiovascular;  Laterality: N/A;  Bilateral   . BACK SURGERY  1991  . CARDIAC CATHETERIZATION Right 2013  . CATARACT EXTRACTION Bilateral 1994  . CORONARY ARTERY BYPASS GRAFT  2013  . PACEMAKER INSERTION    . TRANSCAROTID ARTERY REVASCULARIZATION (TCAR) Right 10/22/2018   TRANSCAROTID ARTERY REVASCULARIZATION REDO (Right Neck)  . TRANSCAROTID ARTERY REVASCULARIZATION Right 07/18/2017   Procedure: TRANSCAROTID ARTERY REVASCULARIZATION;  Surgeon: Waynetta Sandy, MD;  Location: [facility];  Service: Vascular;  Laterality: Right;  . TRANSCAROTID ARTERY REVASCULARIZATION Right 10/22/2018   Procedure: TRANSCAROTID ARTERY  REVASCULARIZATION REDO;  Surgeon: Serafina Mitchell, MD;  Location: [facility];  Service: Vascular;  Laterality: Right;       Family History  Problem Relation Age of Onset  . Heart disease Mother   . Diabetes Mother   . Hypertension Mother   . Arthritis Mother   . Heart disease Father   . Diabetes Father   . Hypertension Father   . Arthritis Father     Social History   Tobacco Use  . Smoking status: Current Every Day Smoker    Packs/day: 0.25    Years: 40.00    Pack years: 10.00    Types: Cigarettes  .  Smokeless tobacco: Never Used  . Tobacco comment: 4 cigarettes a week  Vaping Use  . Vaping Use: Former  Substance Use Topics  . Alcohol use: Yes    Alcohol/week: 1.0 standard drink    Types: 1 Cans of beer per week    Comment: 1 every 6 months or more  . Drug use: No    Home Medications Prior to Admission medications   Medication Sig Start Date End Date Taking? Authorizing Provider  albuterol (VENTOLIN HFA) 108 (90 Base) MCG/ACT inhaler Inhale 1-2 puffs into the lungs every 6 (six) hours as needed for wheezing or shortness of breath. 04/26/20 04/26/21  Danton Clap, PA-C  amiodarone (PACERONE) 200 MG tablet Take 200 mg by mouth daily. 04/20/20   [provider]  aspirin EC 81 MG tablet Take 81 mg by mouth daily.    [provider]  atenolol (TENORMIN) 50 MG tablet Take 50 mg by mouth 2 (two) times daily.     [provider]  atorvastatin (LIPITOR) 40 MG tablet Take 40 mg by mouth daily.    [provider]  Blood Glucose Monitoring Suppl (TRUE METRIX METER) w/Device KIT  09/30/19   [provider]  Dulaglutide (TRULICITY) 3 IW/5.8KD SOPN Inject 3 mg into the skin every Tuesday.    [provider]  empagliflozin (JARDIANCE) 10 MG TABS tablet Take 10 mg by mouth daily.     [provider]  famotidine (PEPCID) 20 MG tablet Take 20 mg by mouth daily.     [provider]  febuxostat (ULORIC) 40 MG tablet Take 40 mg by mouth daily.    [provider]  furosemide (LASIX) 20 MG tablet Take 20 mg by mouth daily.     [provider]  glipiZIDE (GLUCOTROL) 5 MG tablet Take 5 mg by mouth daily. 09/22/18   [provider]  Icosapent Ethyl (VASCEPA) 1 g CAPS Take 1 g by mouth 2 (two) times daily.    [provider]  latanoprost (XALATAN) 0.005 % ophthalmic solution Place 1 drop into both eyes at bedtime. 07/04/17   [provider]  lisinopril (ZESTRIL) 20 MG tablet Take 10 mg by mouth daily.      [provider]  loratadine (CLARITIN) 10 MG tablet Take 10 mg by mouth daily as needed for allergies.    [provider]  nitroGLYCERIN (NITROSTAT) 0.4 MG SL tablet Place 0.4 mg under the tongue every 5 (five) minutes as needed for chest pain.    [provider]  sitaGLIPtin (JANUVIA) 100 MG tablet Take 100 mg by mouth daily.    [provider]  tiotropium (SPIRIVA) 18 MCG inhalation capsule Place 18 mcg into inhaler and inhale daily.     [provider]  TRUE METRIX BLOOD GLUCOSE TEST test strip  09/30/19  [provider]  TRUEplus Lancets 33G [place]  09/30/19   [provider]  varenicline (CHANTIX) 1 MG tablet Take 1 mg by mouth 2 (two) times daily.    [provider]  XARELTO 2.5 MG TABS tablet Take 2.5 mg by mouth 2 (two) times daily.  04/12/18   [provider]    Allergies    Meperidine  Review of Systems   Review of Systems  Neurological: Positive for weakness.   All other systems reviewed and are negative except that which was mentioned in HPI  Physical Exam Updated Vital Signs BP 136/66 (BP Location: Left Arm)   Pulse 84   Temp 100.2 F (37.9 C) (Oral)   Resp 17   SpO2 95%   Physical Exam Constitutional:      General: He is not in acute distress.    Appearance: Normal appearance.  HENT:     Head: Normocephalic and atraumatic.  Eyes:     Conjunctiva/sclera: Conjunctivae normal.  Cardiovascular:     Rate and Rhythm: Normal rate and regular rhythm.     Heart sounds: Normal heart sounds. No murmur heard.   Pulmonary:     Effort: Pulmonary effort is normal.     Breath sounds: Rhonchi present. No wheezing.  Abdominal:     General: Abdomen is flat. Bowel sounds are normal. There is no distension.     Palpations: Abdomen is soft.     Tenderness: There is no abdominal tenderness.  Musculoskeletal:     Right lower leg: No edema.     Left lower leg: No edema.  Skin:    General: Skin is  warm and dry.  Neurological:     Mental Status: He is alert and oriented to person, place, and time.     Comments: fluent  Psychiatric:        Mood and Affect: Mood normal.        Behavior: Behavior normal.     ED Results / Procedures / Treatments   Labs (all labs ordered are listed, but only abnormal results are displayed) Labs Reviewed  BASIC METABOLIC PANEL - Abnormal; Notable for the following components:      Result Value   Sodium 132 (*)    Chloride 95 (*)    Glucose, Bld 304 (*)    Creatinine, Ser 1.93 (*)    GFR, Estimated 38 (*)    All other components within normal limits  CBC - Abnormal; Notable for the following components:   WBC 16.4 (*)    All other components within normal limits  CBG MONITORING, ED - Abnormal; Notable for the following components:   Glucose-Capillary 305 (*)    All other components within normal limits  RESP PANEL BY RT-PCR (FLU A&B, COVID) ARPGX2  CULTURE, BLOOD (ROUTINE X 2)  CULTURE, BLOOD (ROUTINE X 2)  URINE CULTURE  URINALYSIS, ROUTINE W REFLEX MICROSCOPIC  LACTIC ACID, PLASMA  LACTIC ACID, PLASMA  HEPATIC FUNCTION PANEL    EKG EKG Interpretation  Date/Time:  Saturday June 18 2020 13:45:09 EDT Ventricular Rate:  87 PR Interval:  144 QRS Duration: 60 QT Interval:  318 QTC Calculation: 382 R Axis:   81 Text Interpretation: Normal sinus rhythm ST & T wave abnormality, consider lateral ischemia Abnormal ECG St depressions in inferior leads new from previous; Confirmed by Theotis Burrow [phone]) on 06/18/2020 4:31:34 PM   Radiology CT Abdomen Pelvis Wo Contrast  Result Date: 06/18/2020 CLINICAL DATA:  Weakness, low back pain, fever. Nausea  and vomiting. EXAM: CT CHEST, ABDOMEN AND PELVIS WITHOUT CONTRAST TECHNIQUE: Multidetector CT imaging of the chest, abdomen and pelvis was performed following the standard protocol without IV contrast. COMPARISON:  Chest radiograph earlier today. FINDINGS: CT CHEST FINDINGS Cardiovascular:  Left-sided pacemaker in place leads in the right atrium and ventricle. Heart is normal in size. Median sternotomy with calcification of native coronary arteries. Aortic atherosclerosis. No aortic aneurysm. Mediastinum/Nodes: Small mediastinal lymph nodes are not enlarged by size criteria. Suspected 11 mm left hilar node, not well assessed in the absence of IV contrast. Small hiatal hernia. No thyroid nodule. Lungs/Pleura: Left upper lobe and lingular ground-glass and consolidative opacity consistent with pneumonia. Few additional ground-glass opacities in the right upper lobe also likely represent pneumonia. Mild to moderate apical predominant emphysema. Central bronchial thickening. Linear lower lobe atelectasis. Calcified granuloma in the right lower lobe. No noncalcified pulmonary nodules. No pleural fluid. Musculoskeletal: There are no acute or suspicious osseous abnormalities. Median sternotomy CT ABDOMEN PELVIS FINDINGS Hepatobiliary: Decreased hepatic density consistent with steatosis. No evidence of focal liver lesion. Small Phrygian cap in the gallbladder with questionable layering sludge or stones. No abnormal gallbladder distention or pericholecystic fat stranding. No biliary dilatation. Pancreas: No ductal dilatation or inflammation. Spleen: Normal in size without focal abnormality. Adrenals/Urinary Tract: No adrenal nodule. No hydronephrosis or renal calculi. Mild symmetric perinephric edema typically chronic. No evidence of focal renal abnormality on noncontrast exam. Minimally distended urinary bladder. No bladder wall thickening. Stomach/Bowel: Small hiatal hernia. Stomach otherwise unremarkable. No small bowel obstruction or inflammation. Normal appendix. No colonic inflammation or pericolonic edema. Vascular/Lymphatic: Moderate aortic and branch atherosclerosis. No aortic aneurysm. No enlarged lymph nodes in the abdomen or pelvis. Reproductive: Prostate is unremarkable. Other: No free air, free  fluid, or intra-abdominal fluid collection. Surgical clips in the right inguinal region. No abdominal wall hernia. Musculoskeletal: Postsurgical change at L5-S1. Presumed bone graft from the left iliac crest. Multilevel lumbar spondylosis with endplate spurring. There are no acute or suspicious osseous abnormalities. IMPRESSION: 1. Left upper lobe and lingular ground-glass and consolidative opacity consistent with pneumonia. Few additional ground-glass opacities in the right upper lobe also likely represent pneumonia. Recommend radiographic follow-up after course of treatment to ensure resolution. 2. No acute abnormality in the abdomen/pelvis. 3. Emphysema.  Moderate thoracoabdominal aortic atherosclerosis. 4. Hepatic steatosis. 5. Small hiatal hernia. Aortic Atherosclerosis (ICD10-I70.0) and Emphysema (ICD10-J43.9). Electronically Signed   By: Keith Rake M.D.   On: 06/18/2020 16:44   DG Chest 2 View  Result Date: 06/18/2020 CLINICAL DATA:  Fever. EXAM: CHEST - 2 VIEW COMPARISON:  April 26, 2020 FINDINGS: Stable pacemaker. Stable cardiomediastinal silhouette. No pneumothorax. The right lung is clear. Infiltrates seen in the lingula. Hyperinflation of the lungs with flattening of the diaphragms. No other acute abnormalities. IMPRESSION: 1. Findings worrisome for a lingular infiltrate/pneumonia. 2. Findings consistent with COPD or emphysema. Electronically Signed   By: Dorise Bullion III M.D   On: 06/18/2020 14:59   CT Chest Wo Contrast  Result Date: 06/18/2020 CLINICAL DATA:  Weakness, low back pain, fever. Nausea and vomiting. EXAM: CT CHEST, ABDOMEN AND PELVIS WITHOUT CONTRAST TECHNIQUE: Multidetector CT imaging of the chest, abdomen and pelvis was performed following the standard protocol without IV contrast. COMPARISON:  Chest radiograph earlier today. FINDINGS: CT CHEST FINDINGS Cardiovascular: Left-sided pacemaker in place leads in the right atrium and ventricle. Heart is normal in size. Median  sternotomy with calcification of native coronary arteries. Aortic atherosclerosis. No aortic aneurysm. Mediastinum/Nodes: Small mediastinal lymph  nodes are not enlarged by size criteria. Suspected 11 mm left hilar node, not well assessed in the absence of IV contrast. Small hiatal hernia. No thyroid nodule. Lungs/Pleura: Left upper lobe and lingular ground-glass and consolidative opacity consistent with pneumonia. Few additional ground-glass opacities in the right upper lobe also likely represent pneumonia. Mild to moderate apical predominant emphysema. Central bronchial thickening. Linear lower lobe atelectasis. Calcified granuloma in the right lower lobe. No noncalcified pulmonary nodules. No pleural fluid. Musculoskeletal: There are no acute or suspicious osseous abnormalities. Median sternotomy CT ABDOMEN PELVIS FINDINGS Hepatobiliary: Decreased hepatic density consistent with steatosis. No evidence of focal liver lesion. Small Phrygian cap in the gallbladder with questionable layering sludge or stones. No abnormal gallbladder distention or pericholecystic fat stranding. No biliary dilatation. Pancreas: No ductal dilatation or inflammation. Spleen: Normal in size without focal abnormality. Adrenals/Urinary Tract: No adrenal nodule. No hydronephrosis or renal calculi. Mild symmetric perinephric edema typically chronic. No evidence of focal renal abnormality on noncontrast exam. Minimally distended urinary bladder. No bladder wall thickening. Stomach/Bowel: Small hiatal hernia. Stomach otherwise unremarkable. No small bowel obstruction or inflammation. Normal appendix. No colonic inflammation or pericolonic edema. Vascular/Lymphatic: Moderate aortic and branch atherosclerosis. No aortic aneurysm. No enlarged lymph nodes in the abdomen or pelvis. Reproductive: Prostate is unremarkable. Other: No free air, free fluid, or intra-abdominal fluid collection. Surgical clips in the right inguinal region. No abdominal wall  hernia. Musculoskeletal: Postsurgical change at L5-S1. Presumed bone graft from the left iliac crest. Multilevel lumbar spondylosis with endplate spurring. There are no acute or suspicious osseous abnormalities. IMPRESSION: 1. Left upper lobe and lingular ground-glass and consolidative opacity consistent with pneumonia. Few additional ground-glass opacities in the right upper lobe also likely represent pneumonia. Recommend radiographic follow-up after course of treatment to ensure resolution. 2. No acute abnormality in the abdomen/pelvis. 3. Emphysema.  Moderate thoracoabdominal aortic atherosclerosis. 4. Hepatic steatosis. 5. Small hiatal hernia. Aortic Atherosclerosis (ICD10-I70.0) and Emphysema (ICD10-J43.9). Electronically Signed   By: Keith Rake M.D.   On: 06/18/2020 16:44    Procedures Procedures  CRITICAL CARE Performed by: Wenda Overland Kaylan Friedmann   Total critical care time: 30 minutes  Critical care time was exclusive of separately billable procedures and treating other patients.  Critical care was necessary to treat or prevent imminent or life-threatening deterioration.  Critical care was time spent personally by me on the following activities: development of treatment plan with patient and/or surrogate as well as nursing, discussions with consultants, evaluation of patient's response to treatment, examination of patient, obtaining history from patient or surrogate, ordering and performing treatments and interventions, ordering and review of laboratory studies, ordering and review of radiographic studies, pulse oximetry and re-evaluation of patient's condition.   Medications Ordered in ED Medications - No data to display  ED Course  I have reviewed the triage vital signs and the nursing notes.  Pertinent labs & imaging results that were available during my care of the patient were reviewed by me and considered in my medical decision making (see chart for details).    MDM  Rules/Calculators/A&P                          PT alert, non-toxic on exam, T 100.2, O2 sat 94-95% on RA.  Labs show glucose 304 with normal anion gap, creatinine 1.93 which is stable from previous, WBC 16.4, COVID and flu negative.  Chest x-ray with possible lingular pneumonia.  Initial lactate 4.  Activated a  code sepsis with blood and urine cultures, IV fluids, and ceftriaxone and doxycycline to cover for CAP.  Because the patient has not had significant respiratory symptoms, obtain CT of chest and abdomen/pelvis for better evaluation of his septic presentation.  CT confirms multifocal pneumonia.  His repeat lactate is 3, however he has not received full 30 mL/kg bolus therefore we will repeat again.  He has developed a 2 L oxygen requirement but remains alert and no respiratory distress on repeat exam.  Discussed admission with [facility] hospitalist, Dr. Kipp Brood.  Final Clinical Impression(s) / ED Diagnoses Final diagnoses:  Multifocal pneumonia    Rx / DC Orders ED Discharge Orders    None       Geza Beranek, Wenda Overland, MD 06/19/20 0025

## 2020-06-18 NOTE — Sepsis Progress Note (Signed)
Monitoring for code sepsis protocol. 

## 2020-06-18 NOTE — H&P (Addendum)
Date: 06/18/2020               Patient Name:  Dean Ellison MRN: [id number]  DOB: 04/25/1955 Age / Sex: 65 y.o., male   PCP: Secundino Ginger, PA-C         Medical Service: Internal Medicine Teaching Service         Attending Physician: Dr. Sid Falcon, MD    First Contact: Dr. Alexandria Lodge Pager: [phone]  Second Contact: Dr. Mitzi Hansen Pager: [phone]       After Hours (After 5p/  First Contact Pager: [phone]  weekends / holidays): Second Contact Pager: [phone]   Chief Complaint: weakness/ fatigue  History of Present Illness: Dean Ellison is a 65 year old male with PMHx of coronary artery disease s/p CABG in 2013, sick sinus syndrome s/p ICD placement, paroxysmal atrial fibrillation on Xarelto, COPD not on oxygen, hypertension, OSA not on CPAP since 2013, hyperlipidemia, degenerative disc disease, peripheral vascular disease presenting with fatigue/weakness for several weeks duration.History is obtained by patient and son at bedside. He endorsed generalized malaise; however, this was nonspecific. This morning, patient noted feeling hot which is unusual for him. He also endorsed feeling lightheaded and dizzy and thought he was going to pass out. He was noted to have temp of 102.8 at home for which he was brought to the hospital. He endorses some left sided chest/left upper quadrant abdominal pain with deep inspiration. He does endorse a productive cough but notes that this similar to baseline although his son endorses that this may be somewhat increased.  He has also had nausea all day with one episode of yellow, watery emesis. He notes some mild abdominal distension but denies any abdominal pain or constipation/diarrhea. Appetite has been good. No urinary symptoms.  Meds:  Current Meds  Medication Sig  . albuterol (VENTOLIN HFA) 108 (90 Base) MCG/ACT inhaler Inhale 1-2 puffs into the lungs every 6 (six) hours as needed for wheezing or shortness of breath.  Marland Kitchen  amiodarone (PACERONE) 200 MG tablet Take 200 mg by mouth daily.  Marland Kitchen aspirin EC 81 MG tablet Take 81 mg by mouth daily.  Marland Kitchen atenolol (TENORMIN) 50 MG tablet Take 50 mg by mouth 2 (two) times daily.   Marland Kitchen atorvastatin (LIPITOR) 40 MG tablet Take 40 mg by mouth daily.  . Dulaglutide (TRULICITY) 3 YT/0.3TW SOPN Inject 3 mg into the skin every Tuesday.  . famotidine (PEPCID) 20 MG tablet Take 20 mg by mouth 2 (two) times daily.  . furosemide (LASIX) 20 MG tablet Take 20 mg by mouth daily.   Marland Kitchen glipiZIDE (GLUCOTROL) 5 MG tablet Take 5 mg by mouth daily.  Vanessa Kick Ethyl (VASCEPA) 1 g CAPS Take 2 g by mouth 2 (two) times daily.  . isosorbide mononitrate (IMDUR) 30 MG 24 hr tablet Take 30 mg by mouth daily.  Marland Kitchen latanoprost (XALATAN) 0.005 % ophthalmic solution Place 1 drop into both eyes at bedtime.  . nitroGLYCERIN (NITROSTAT) 0.4 MG SL tablet Place 0.4 mg under the tongue every 5 (five) minutes as needed for chest pain.  . sitaGLIPtin (JANUVIA) 100 MG tablet Take 100 mg by mouth daily.  Marland Kitchen tiotropium (SPIRIVA) 18 MCG inhalation capsule Place 18 mcg into inhaler and inhale daily.   . varenicline (CHANTIX) 1 MG tablet Take 1 mg by mouth 2 (two) times daily.  Alveda Reasons 2.5 MG TABS tablet Take 2.5 mg by mouth 2 (two) times daily.    Allergies: Allergies  as of 06/18/2020 - Review Complete 06/18/2020  Allergen Reaction Noted  . Meperidine Other (See Comments) 12/13/2010   Past Medical History:  Diagnosis Date  . Anemia   . Arthritis   . CAD (coronary artery disease)   . Carotid artery occlusion   . COPD (chronic obstructive pulmonary disease) ([place])   . Diabetes mellitus without complication ([place])   . Dyspnea   . Gout   . Hyperlipidemia   . Hypertension   . OSA (obstructive sleep apnea)    does not use CPAP  . Persistent disorder of initiating or maintaining sleep   . Presence of permanent cardiac pacemaker   . Vitamin D deficiency     Family History:  Family History  Problem Relation Age of  Onset  . Heart disease Mother   . Diabetes Mother   . Hypertension Mother   . Arthritis Mother   . Heart disease Father   . Diabetes Father   . Hypertension Father   . Arthritis Father   Thinks 1 sister has thyroid disease. Two aunts and uncle with cancer (all maternal), unknown type.  Social History:  Lives with older son, retired for the last 8-54yr previously worked at [employer] 248yr Not married, two children. Smokes ~1 cigarette daily (at most 3), roughly 1ppd for 40+yr, taking Chantix (a few years since decreased use). Rare alcohol use. No herbal supplements. No recreational drugs.  Review of Systems: A complete ROS was negative except as per HPI.   Physical Exam: Blood pressure 130/70, pulse 84, temperature 100.2 F (37.9 C), temperature source Oral, resp. rate 17, height 5' 10" (1.778 m), weight 90.7 kg, SpO2 95 %. Physical Exam  Constitutional: Elderly male, appears well-developed and well-nourished. No acute distress.  HENT: Normocephalic and atraumatic, EOMI, conjunctiva normal, moist mucous membranes Cardiovascular: Normal rate, regular rhythm, S1 and S2 present, faint systolic murmur, no rubs, gallops.  Distal pulses intact Respiratory: No respiratory distress, Effort is normal.  Mild bibasilar crackles with diminished breath sounds at left base; on 2L O2  GI: Slightly distended, soft, tender to palpation at LUQ, active bowel sounds Musculoskeletal: Normal bulk and tone.  No peripheral edema noted. Neurological: Is alert and oriented x4, no apparent focal deficits noted. Skin: Warm and dry.  No rash, erythema, lesions noted. Psychiatric: Normal mood and affect. Behavior is normal. Judgment and thought content normal.   EKG: personally reviewed my interpretation is NSR, nonspecific ST segment depression in leads II, III, aVF and V3. QTc 386  CXR:  IMPRESSION: 1. Findings worrisome for a lingular infiltrate/pneumonia. 2. Findings consistent  with COPD or emphysema.  CT CHEST/ABDOMEN/PELVIS WO CONTRAST:  IMPRESSION: 1. Left upper lobe and lingular ground-glass and consolidative opacity consistent with pneumonia. Few additional ground-glass opacities in the right upper lobe also likely represent pneumonia. Recommend radiographic follow-up after course of treatment to ensure resolution. 2. No acute abnormality in the abdomen/pelvis. 3. Emphysema.  Moderate thoracoabdominal aortic atherosclerosis. 4. Hepatic steatosis. 5. Small hiatal hernia.  Assessment & Plan by Problem: Active Problems:   Multifocal pneumonia Dean Dean Ellison a [age]ear old male with PMHx of CAD s/p CABG, SSS s/p ICD, PAF on Xarelto, COPD not on oxygen at baseline, CKDIII, OSA not on CPAP since 2013 admitted for sepsis secondary to multifocal pneumonia.  Sepsis secondary to multifocal pneumonia: Acute hypoxic respiratory failure: Hx of COPD:  Patient is presenting with several days of generalized malaise/fatigue. He was noted to have temp of 102.8 prior  to arrival with Tmax of 100.2 in the ED. Initial BP on arrival 103/60 with MAP 73; however did have some tachypnea. He endorses some worsening of dyspnea but notes persistent productive cough in setting of his COPD that is at baseline. CXR with lingular infiltrate/pneumonia that was confirmed with CT Chest with multifocal pneumonia in LUL, lingula and RUL. Leukocytosis of 16 with lactic acidosis 4.3> 3.3 with 3L fluid resuscitation. Patient received one dose of ceftriaxone and doxycycline in the ED. Examination with minimal bibasilar crackles and diminished breath sounds in left base. He is saturating >95% on 2L oxygen via nasal cannula.  - Continue IV ceftriaxone, add azithromycin - Continue LR 150 cc/hr  - Trending lactic acid  - F/u blood cultures  - Oxygen as needed - Incruse Ellipta 1 puff daily  - PT/OT eval   AKI on CKDIII:  Proteinuria Hemoglobinuria Patient with history of CKDIII, baseline sCr  ~1.6, presented with sCr of 1.93. Suspect pre-renal in setting of acute infection. Urinalysis without pyuria or bacteruria but did have hemoglobinuria without hematuria and proteinuria. No renal abnormalities noted on CT Abd/Pelvis wo Contrast. This may be progression of his renal disease vs in setting of poorly controlled DM given his glucosuria.  - Urine protein/cr ratio - CK levels - Will hold off on ESR/CRP in setting of acute infection as above - Fluid resuscitation as above - F/u renal function in AM - Avoid nephrotoxic agents as able   Hyperglycemia:  Diabetes mellitus:  Patient with history of diabetes with last known A1c of 9.2 in August 2020. He is on Januvia, Glipizide and Trulicity. Glucose on admission 302 with glucosuria noted on UA.  - Hemoglobin A1c - SSI (average) mealtime, may need addition of basal insulin if remain elevated - CBG monitoring   Paroxysmal atrial fibrillation: Patient is currently in sinus rhythm. He is on Xarelto. - Cardiac monitoring - Continue Xarelto 2.44m daily   CAD s/p CABG: Hypertension:  Hyperlipidemia:  [facility] seen a few weeks ago at which time IMDUR was added to his regimen for hypertension. Initially had soft BP's on presentation; but currently is normotensive. Patient did endorse some left sided chest discomfort, but this is mostly on deep inspiration. Nonspecific ST segment changes on EKG that are new from prior exams. However, troponins are not elevated. Will continue to monitor at this time  - Continue amiodarone 2063mdaily - Continue aspirin 8159maily - Continue atorvastatin 31m35mily - Resume atenolol 31mg22m and IMDUR 30mg 6my as tolerated   Diet: HH/CM Fluids: LR 150 cc/hr  DVT Prophylaxis: Xarelto Code status: FULL   Dispo: Admit patient to Observation with expected length of stay less than 2 midnights.  Signed: Aslam,Harvie HeckIMTS PGY-2 06/18/2020,  10:55 PM  Pager: [phone] 5pm on weekdays and 1pm on weekends: On Call pager: [phone]

## 2020-06-18 NOTE — ED Triage Notes (Signed)
States blood sugar was 400 this morning and temp was 102.8.  C/o generalized weakness, lower back pain, and pain to back of head.

## 2020-06-18 NOTE — ED Provider Notes (Signed)
MSE was initiated and I personally evaluated the patient and placed orders (if any) at  1:45 PM on June 18, 2020.  Patient placed in Quick Look pathway, seen and evaluated   Chief Complaint: fever, body aches, fatigue  HPI:   Patient with elevated blood sugar 400.  States he had a fever earlier. Patient briefly assessed by myself and hallway of triage Does not appear to be in any significant acute distress.  Temperature mildly elevated otherwise vital signs normal.  CBG 305.  ROS: Fever (one)  Physical Exam:   Gen: No distress  Neuro: Awake and Alert  Skin: Warm    Focused Exam:   CONSTITUTIONAL:  well-appearing, NAD NEURO:  Alert and oriented x 3, no focal deficits EYES:  pupils equal and reactive ENT/NECK:  trachea midline, no JVD CARDIO:  PULM:  None labored breathing GI/GU:  Abdomen non-distended MSK/SPINE:  No gross deformities, no edema SKIN:  no rash obvious, atraumatic, no ecchymosis  PSYCH:  Appropriate speech and behavior    Patient was very briefly assessed and triaged hallway by myself.  Will obtain screening labs given patient is having some fever and fatigue.  Says been having some low back pain and headache as well.  Given patient's fever at home and elevated temperature here today.  We will obtain COVID and influenza swab.  Some concern for this. Was unable to obtain full physical exam given the patient is being moved to the waiting room at this time.  Initiation of care has begun. The patient has been counseled on the process, plan, and necessity for staying for the completion/evaluation, and the remainder of the medical screening examination   The patient appears stable so that the remainder of the MSE may be completed by another provider.   Pati Gallo [place], [place] 06/18/20 1417    Quintella Reichert, MD 06/18/20 1446

## 2020-06-19 DIAGNOSIS — I10 Essential (primary) hypertension: Secondary | ICD-10-CM | POA: Diagnosis not present

## 2020-06-19 DIAGNOSIS — J449 Chronic obstructive pulmonary disease, unspecified: Secondary | ICD-10-CM

## 2020-06-19 DIAGNOSIS — J189 Pneumonia, unspecified organism: Secondary | ICD-10-CM

## 2020-06-19 DIAGNOSIS — G4733 Obstructive sleep apnea (adult) (pediatric): Secondary | ICD-10-CM | POA: Diagnosis not present

## 2020-06-19 DIAGNOSIS — A419 Sepsis, unspecified organism: Secondary | ICD-10-CM | POA: Diagnosis not present

## 2020-06-19 DIAGNOSIS — E119 Type 2 diabetes mellitus without complications: Secondary | ICD-10-CM

## 2020-06-19 LAB — PROTEIN / CREATININE RATIO, URINE
Creatinine, Urine: 44.82 mg/dL
Protein Creatinine Ratio: 2.05 mg/mg{Cre} — ABNORMAL HIGH (ref 0.00–0.15)
Total Protein, Urine: 92 mg/dL

## 2020-06-19 LAB — BASIC METABOLIC PANEL
Anion gap: 10 (ref 5–15)
BUN: 15 mg/dL (ref 8–23)
CO2: 26 mmol/L (ref 22–32)
Calcium: 8.6 mg/dL — ABNORMAL LOW (ref 8.9–10.3)
Chloride: 97 mmol/L — ABNORMAL LOW (ref 98–111)
Creatinine, Ser: 1.45 mg/dL — ABNORMAL HIGH (ref 0.61–1.24)
GFR, Estimated: 54 mL/min — ABNORMAL LOW (ref >60)
Glucose, Bld: 326 mg/dL — ABNORMAL HIGH (ref 70–99)
Potassium: 4.3 mmol/L (ref 3.5–5.1)
Sodium: 133 mmol/L — ABNORMAL LOW (ref 135–145)

## 2020-06-19 LAB — CBC
HCT: 37.7 % — ABNORMAL LOW (ref 39.0–52.0)
Hemoglobin: 12.3 g/dL — ABNORMAL LOW (ref 13.0–17.0)
MCH: 29.7 pg (ref 26.0–34.0)
MCHC: 32.6 g/dL (ref 30.0–36.0)
MCV: 91.1 fL (ref 80.0–100.0)
Platelets: 163 10*3/uL (ref 150–400)
RBC: 4.14 MIL/uL — ABNORMAL LOW (ref 4.22–5.81)
RDW: 14.3 % (ref 11.5–15.5)
WBC: 10.9 10*3/uL — ABNORMAL HIGH (ref 4.0–10.5)
nRBC: 0 % (ref 0.0–0.2)

## 2020-06-19 LAB — CBG MONITORING, ED: Glucose-Capillary: 191 mg/dL — ABNORMAL HIGH (ref 70–99)

## 2020-06-19 LAB — LACTIC ACID, PLASMA
Lactic Acid, Venous: 1.7 mmol/L (ref 0.5–1.9)
Lactic Acid, Venous: 1.8 mmol/L (ref 0.5–1.9)

## 2020-06-19 LAB — URINE CULTURE: Culture: <10000 — AB

## 2020-06-19 LAB — HIV ANTIBODY (ROUTINE TESTING W REFLEX): HIV Screen 4th Generation wRfx: NONREACTIVE

## 2020-06-19 LAB — GLUCOSE, CAPILLARY: Glucose-Capillary: 342 mg/dL — ABNORMAL HIGH (ref 70–99)

## 2020-06-19 LAB — HEMOGLOBIN A1C
Hgb A1c MFr Bld: 12.4 % — ABNORMAL HIGH (ref 4.8–5.6)
Mean Plasma Glucose: 309.18 mg/dL

## 2020-06-19 MED ORDER — LATANOPROST 0.005 % OP SOLN
1.0000 [drp] | Freq: Every day | OPHTHALMIC | Status: DC
  Filled 2020-06-19: qty 2.5

## 2020-06-19 MED ORDER — AZITHROMYCIN 500 MG PO TABS: 500.0000 mg | ORAL_TABLET | Freq: Every day | ORAL | 0 refills | Status: AC

## 2020-06-19 MED ORDER — AMOXICILLIN-POT CLAVULANATE 875-125 MG PO TABS: 1.0000 | ORAL_TABLET | Freq: Two times a day (BID) | ORAL | 0 refills | Status: AC

## 2020-06-19 NOTE — Discharge Instructions (Signed)
Mr. Wiberg,   It was a pleasure taking care of you in the hospital. You were admitted to the hospital and treated for pneumonia and dehydration. You were treated with antibiotics which you will continue for the next few days: Augmentin 1 tablet twice daily for 4 days & Azithromycin 1 tablet daily for 3 days.  Be sure to stay hydrated!  We would like for you to follow-up with with your primary care provider in the next week.  Take care!

## 2020-06-19 NOTE — Discharge Summary (Signed)
Name: Dean Ellison MRN: [id number] DOB: 1955/06/19 65 y.o. PCP: Gwendel Hanson  Date of Admission: 06/18/2020  1:31 PM Date of Discharge: 06/19/2020 Attending Physician: Sid Falcon, MD  Discharge Diagnosis:  1. Multifocal pneumonia 2. Chronic obstructive pulmonary disease 3. Obstructive sleep apnea 4. Hypertension 5. Diabetes mellitus  Discharge Medications: Allergies as of 06/19/2020      Reactions   Meperidine Other (See Comments)   Demerol. Pt stated "it made me crazy"      Medication List    TAKE these medications   albuterol 108 (90 Base) MCG/ACT inhaler Commonly known as: VENTOLIN HFA Inhale 1-2 puffs into the lungs every 6 (six) hours as needed for wheezing or shortness of breath.   amiodarone 200 MG tablet Commonly known as: PACERONE Take 200 mg by mouth daily.   amoxicillin-clavulanate 875-125 MG tablet Commonly known as: Augmentin Take 1 tablet by mouth 2 (two) times daily for 4 days.   aspirin EC 81 MG tablet Take 81 mg by mouth daily.   atenolol 50 MG tablet Commonly known as: TENORMIN Take 50 mg by mouth 2 (two) times daily.   atorvastatin 40 MG tablet Commonly known as: LIPITOR Take 40 mg by mouth daily.   azithromycin 500 MG tablet Commonly known as: Zithromax Take 1 tablet (500 mg total) by mouth daily for 3 days. Take 1 tablet daily for 3 days.   famotidine 20 MG tablet Commonly known as: PEPCID Take 20 mg by mouth 2 (two) times daily.   furosemide 20 MG tablet Commonly known as: LASIX Take 20 mg by mouth daily.   glipiZIDE 5 MG tablet Commonly known as: GLUCOTROL Take 5 mg by mouth daily.   isosorbide mononitrate 30 MG 24 hr tablet Commonly known as: IMDUR Take 30 mg by mouth daily.   latanoprost 0.005 % ophthalmic solution Commonly known as: XALATAN Place 1 drop into both eyes at bedtime.   nitroGLYCERIN 0.4 MG SL tablet Commonly known as: NITROSTAT Place 0.4 mg under the tongue every 5 (five) minutes as  needed for chest pain.   sitaGLIPtin 100 MG tablet Commonly known as: JANUVIA Take 100 mg by mouth daily.   tiotropium 18 MCG inhalation capsule Commonly known as: SPIRIVA Place 18 mcg into inhaler and inhale daily.   True Metrix Blood Glucose Test test strip Generic drug: glucose blood   True Metrix Meter w/Device Kit   TRUEplus Lancets 02I Misc   Trulicity 3 OX/7.3ZH Sopn Generic drug: Dulaglutide Inject 3 mg into the skin every Tuesday.   varenicline 1 MG tablet Commonly known as: CHANTIX Take 1 mg by mouth 2 (two) times daily.   Vascepa 1 g capsule Generic drug: icosapent Ethyl Take 2 g by mouth 2 (two) times daily.   Xarelto 2.5 MG Tabs tablet Generic drug: rivaroxaban Take 2.5 mg by mouth 2 (two) times daily.       Disposition and follow-up:   Mr.Mayer E Mcneary was discharged from [facility] in Stable condition.  At the hospital follow up visit please address:  1.    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  -  Urinalysis without pyuria or bacteruria but did have hemoglobinuria without hematuria and proteinuria. Urine protein/cr ratio 2.05.  - Serum creatinine improved to 1.45 on the morning of discharge.  - Repeat BMP  2.  Labs / imaging needed at time of follow-up: CBC, BMP  3.  Pending labs/ test needing follow-up: Hemoglobin A1c, Blood cultures no growth at 24 hours at time of discharge  Follow-up Appointments:  Follow-up Information    Secundino Ginger, PA-C. Schedule an appointment as soon as possible for a  visit in 1 week(s).   Specialty: Cardiology Contact information: [facility]  [address] [place] [address] [phone]               Hospital Course by problem list:  Mr. Glasheen is a 65 year old man with past medical history of coronary artery disease s/p CABG in 2013, sick sinus syndrome s/p ICD placement, paroxysmal atrial fibrillation on Xarelto, COPD not on oxygen, hypertension, OSA not on CPAP since 2013, hyperlipidemia, degenerative disc disease, peripheral vascular disease presenting with fatigue/weakness for several weeks duration and admitted for sepsis secondary to multifocal pneumonia.  Sepsis secondary to multifocal pneumonia Acute hypoxic respiratory failure History of COPD Recent COVID-19 infection s/p outpatient monoclonal antibody treatment 04/29/20 Patient presented with several days of generalized malaise/fatigue. He was noted to have temp of 102.8 prior to arrival with Tmax of 100.2 in the ED. Initial BP on arrival 103/60 with MAP 73. He was tachypneic. He endorsed some worsening of dyspnea but notes persistent productive cough in setting of his COPD that is at baseline. CXR with lingular infiltrate/pneumonia that was confirmed with CT Chest with multifocal pneumonia in LUL, lingula and RUL. Leukocytosis of 16 with lactic acidosis 4.3>3.3>1.7 with fluid resuscitation. Received a dose of ceftriaxone and doxycycline in the ED. On admission, his exam was significant for minimal bibasilar crackles and diminished breath sounds in left base. He was saturating >95% on 2L oxygen via nasal cannula. Overnight, he was afebrile. The next morning, he was feeling much improved, maintaining oxygen saturation on room air. Leukocytosis improved from 16.4>10.9. Blood culture no growth at 24 hrs. He was discharged home with prescription for 4 days of Augmentin and 3 days of azithromycin. Advised close PCP follow-up.  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 Suspect pre-renal in setting of acute infection. Urinalysis without pyuria or bacteruria but did have hemoglobinuria without hematuria and proteinuria. CK 97. No renal abnormalities noted on CT Abd/Pelvis wo Contrast. Serum creatinine improved to 1.45 on the morning of discharge. Urine protein/cr ratio 2.05.   Paroxysmal atrial fibrillation Patient in sinus rhythm during this admission. He was continued on Xarelto.  CAD s/p CABG Hypertension Hyperlipidemia Patient follows with Dr Gala Lewandowsky, cardiologist, at [facility]. He was last seen a few weeks ago at which time Imdur was added to his regimen for hypertension. BP soft initially on presentation to the ED but normotensive on admission. Patient did endorse some left sided chest discomfort, but this is mostly on deep inspiration. Nonspecific ST segment changes on EKG that were new from prior exams. However, troponins were not elevated. He was continued on his home home amiodarone 243m daily, aspirin 8107mdaily, atorvastatin 4062maily. Atenolol 69m65md and Imdur 30mg30mly resumed at discharge.  ~~~~~~~~~~~~~~~~~~~~~~~~~~~~~~~~~~~~~~~~~~~~~~~~~~~~~~~~~~~~~~~~~~~~~~~~~~~~~~~~~~~~~~  Day of Discharge Subjective:  Patient admitted yesterday evening. No acute events overnight. During evaluation this morning, patient states he is feeling "much better  than yesterday." He states he is breathing comfortably on room air. Endorses mild lingering pain with deep inspiration however notes it is improved. He states he is ready to go home. Reports he has not seen his PCP in the last month since being diagnosed with COVID. He plans on scheduling follow-up with him in the next week.  Discharge Exam:   BP 123/68 (BP Location: Right Arm)   Pulse 71   Temp 98.3 F (36.8 C) (Oral)   Resp 18   Ht 5' 10"  (1.778 m)   Wt 90.7 kg   SpO2 95%   BMI 28.70 kg/m   Constitutional: sitting up in  bed, in no acute distress HENT: normocephalic atraumatic, mucous membranes moist Eyes: conjunctiva non-erythematous Neck: supple Cardiovascular: regular rate and rhythm, no m/r/g, no lower extremity edema Pulmonary/Chest: normal work of breathing on room air, rales at right mid lung which clear with coughing Abdominal: obese, non-tender, non-distended MSK: normal bulk and tone for age Skin: warm and dry  Pertinent Labs, Studies, and Procedures:   CT Abdomen Pelvis Wo Contrast  Result Date: 06/18/2020 CLINICAL DATA:  Weakness, low back pain, fever. Nausea and vomiting. EXAM: CT CHEST, ABDOMEN AND PELVIS WITHOUT CONTRAST TECHNIQUE: Multidetector CT imaging of the chest, abdomen and pelvis was performed following the standard protocol without IV contrast. COMPARISON:  Chest radiograph earlier today. FINDINGS: CT CHEST FINDINGS Cardiovascular: Left-sided pacemaker in place leads in the right atrium and ventricle. Heart is normal in size. Median sternotomy with calcification of native coronary arteries. Aortic atherosclerosis. No aortic aneurysm. Mediastinum/Nodes: Small mediastinal lymph nodes are not enlarged by size criteria. Suspected 11 mm left hilar node, not well assessed in the absence of IV contrast. Small hiatal hernia. No thyroid nodule. Lungs/Pleura: Left upper lobe and lingular ground-glass and consolidative opacity consistent with pneumonia. Few additional ground-glass opacities in the right upper lobe also likely represent pneumonia. Mild to moderate apical predominant emphysema. Central bronchial thickening. Linear lower lobe atelectasis. Calcified granuloma in the right lower lobe. No noncalcified pulmonary nodules. No pleural fluid. Musculoskeletal: There are no acute or suspicious osseous abnormalities. Median sternotomy CT ABDOMEN PELVIS FINDINGS Hepatobiliary: Decreased hepatic density consistent with steatosis. No evidence of focal liver lesion. Small Phrygian cap in the gallbladder  with questionable layering sludge or stones. No abnormal gallbladder distention or pericholecystic fat stranding. No biliary dilatation. Pancreas: No ductal dilatation or inflammation. Spleen: Normal in size without focal abnormality. Adrenals/Urinary Tract: No adrenal nodule. No hydronephrosis or renal calculi. Mild symmetric perinephric edema typically chronic. No evidence of focal renal abnormality on noncontrast exam. Minimally distended urinary bladder. No bladder wall thickening. Stomach/Bowel: Small hiatal hernia. Stomach otherwise unremarkable. No small bowel obstruction or inflammation. Normal appendix. No colonic inflammation or pericolonic edema. Vascular/Lymphatic: Moderate aortic and branch atherosclerosis. No aortic aneurysm. No enlarged lymph nodes in the abdomen or pelvis. Reproductive: Prostate is unremarkable. Other: No free air, free fluid, or intra-abdominal fluid collection. Surgical clips in the right inguinal region. No abdominal wall hernia. Musculoskeletal: Postsurgical change at L5-S1. Presumed bone graft from the left iliac crest. Multilevel lumbar spondylosis with endplate spurring. There are no acute or suspicious osseous abnormalities. IMPRESSION: 1. Left upper lobe and lingular ground-glass and consolidative opacity consistent with pneumonia. Few additional ground-glass opacities in the right upper lobe also likely represent pneumonia. Recommend radiographic follow-up after course of treatment to ensure resolution. 2. No acute abnormality in the abdomen/pelvis. 3. Emphysema.  Moderate thoracoabdominal aortic atherosclerosis. 4. Hepatic steatosis. 5. Small hiatal hernia. Aortic  Atherosclerosis (ICD10-I70.0) and Emphysema (ICD10-J43.9). Electronically Signed   By: Keith Rake M.D.   On: 06/18/2020 16:44   DG Chest 2 View  Result Date: 06/18/2020 CLINICAL DATA:  Fever. EXAM: CHEST - 2 VIEW COMPARISON:  April 26, 2020 FINDINGS: Stable pacemaker. Stable cardiomediastinal silhouette.  No pneumothorax. The right lung is clear. Infiltrates seen in the lingula. Hyperinflation of the lungs with flattening of the diaphragms. No other acute abnormalities. IMPRESSION: 1. Findings worrisome for a lingular infiltrate/pneumonia. 2. Findings consistent with COPD or emphysema. Electronically Signed   By: Dorise Bullion III M.D   On: 06/18/2020 14:59   CT Chest Wo Contrast  Result Date: 06/18/2020 CLINICAL DATA:  Weakness, low back pain, fever. Nausea and vomiting. EXAM: CT CHEST, ABDOMEN AND PELVIS WITHOUT CONTRAST TECHNIQUE: Multidetector CT imaging of the chest, abdomen and pelvis was performed following the standard protocol without IV contrast. COMPARISON:  Chest radiograph earlier today. FINDINGS: CT CHEST FINDINGS Cardiovascular: Left-sided pacemaker in place leads in the right atrium and ventricle. Heart is normal in size. Median sternotomy with calcification of native coronary arteries. Aortic atherosclerosis. No aortic aneurysm. Mediastinum/Nodes: Small mediastinal lymph nodes are not enlarged by size criteria. Suspected 11 mm left hilar node, not well assessed in the absence of IV contrast. Small hiatal hernia. No thyroid nodule. Lungs/Pleura: Left upper lobe and lingular ground-glass and consolidative opacity consistent with pneumonia. Few additional ground-glass opacities in the right upper lobe also likely represent pneumonia. Mild to moderate apical predominant emphysema. Central bronchial thickening. Linear lower lobe atelectasis. Calcified granuloma in the right lower lobe. No noncalcified pulmonary nodules. No pleural fluid. Musculoskeletal: There are no acute or suspicious osseous abnormalities. Median sternotomy CT ABDOMEN PELVIS FINDINGS Hepatobiliary: Decreased hepatic density consistent with steatosis. No evidence of focal liver lesion. Small Phrygian cap in the gallbladder with questionable layering sludge or stones. No abnormal gallbladder distention or pericholecystic fat  stranding. No biliary dilatation. Pancreas: No ductal dilatation or inflammation. Spleen: Normal in size without focal abnormality. Adrenals/Urinary Tract: No adrenal nodule. No hydronephrosis or renal calculi. Mild symmetric perinephric edema typically chronic. No evidence of focal renal abnormality on noncontrast exam. Minimally distended urinary bladder. No bladder wall thickening. Stomach/Bowel: Small hiatal hernia. Stomach otherwise unremarkable. No small bowel obstruction or inflammation. Normal appendix. No colonic inflammation or pericolonic edema. Vascular/Lymphatic: Moderate aortic and branch atherosclerosis. No aortic aneurysm. No enlarged lymph nodes in the abdomen or pelvis. Reproductive: Prostate is unremarkable. Other: No free air, free fluid, or intra-abdominal fluid collection. Surgical clips in the right inguinal region. No abdominal wall hernia. Musculoskeletal: Postsurgical change at L5-S1. Presumed bone graft from the left iliac crest. Multilevel lumbar spondylosis with endplate spurring. There are no acute or suspicious osseous abnormalities. IMPRESSION: 1. Left upper lobe and lingular ground-glass and consolidative opacity consistent with pneumonia. Few additional ground-glass opacities in the right upper lobe also likely represent pneumonia. Recommend radiographic follow-up after course of treatment to ensure resolution. 2. No acute abnormality in the abdomen/pelvis. 3. Emphysema.  Moderate thoracoabdominal aortic atherosclerosis. 4. Hepatic steatosis. 5. Small hiatal hernia. Aortic Atherosclerosis (ICD10-I70.0) and Emphysema (ICD10-J43.9). Electronically Signed   By: Keith Rake M.D.   On: 06/18/2020 16:44    Discharge Instructions: Discharge Instructions    Call MD for:  difficulty breathing, headache or visual disturbances   Complete by: As directed    Call MD for:  extreme fatigue   Complete by: As directed    Call MD for:  persistant dizziness or light-headedness   Complete  by: As directed    Call MD for:  persistant nausea and vomiting   Complete by: As directed    Call MD for:  severe uncontrolled pain   Complete by: As directed    Call MD for:  temperature >100.4   Complete by: As directed      Mr. Larrabee,   It was a pleasure taking care of you in the hospital. You were admitted to the hospital and treated for pneumonia and dehydration. You were treated with antibiotics which you will continue for the next few days: Augmentin 1 tablet twice daily for 4 days & Azithromycin 1 tablet daily for 3 days.  Be sure to stay hydrated!  We would like for you to follow-up with with your primary care provider in the next week.  Take care!  Signed: Alexandria Lodge, MD 06/19/2020, 10:47 AM   Pager: [phone]

## 2020-06-19 NOTE — ED Notes (Signed)
Pt's brother at bedside states "all they did last night was take blood and wake him up". Explained that repeat lactic and glucose levels were needed to determine pt's response to interventions. Brother very verbally aggressive and combative with this nurse. Brother states "If this is the way you treat emergencies then I will never bring him back to this place." Attempted to only address pt at this point as brother continued to be verbally antagonistic and combative. Pt's brother then stated "Fuck you, I haven't gotten sleep and I work Architect." Pt pleasant and cooperative.

## 2020-06-19 NOTE — Evaluation (Signed)
Physical Therapy Evaluation Patient Details Name: Dean Ellison MRN: [id number] DOB: 12-Feb-1956 Today's Date: 06/19/2020   History of Present Illness  Pt is a 65 y.o. M who presents with fatigue/weakness for several weeks duration and admitted for sepsis secondary to multifocal PNA. Significant PMH: CAD s/p CABG in 2013, SSS s/p ICD placement, paroxysmal atrial fibrillation, COPD, HTN, HLD, PVD.  Clinical Impression  Patient evaluated by Physical Therapy with no further acute PT needs identified. Pt ambulating hallway distances and negotiated 3 steps without physical difficulty. SpO2 95-96% on RA, HR peak 107 bpm. All education has been completed and the patient has no further questions. No follow-up Physical Therapy or equipment needs. PT is signing off. Thank you for this referral.     Follow Up Recommendations No PT follow up    Equipment Recommendations  None recommended by PT    Recommendations for Other Services       Precautions / Restrictions Precautions Precautions: None Restrictions Weight Bearing Restrictions: No      Mobility  Bed Mobility               General bed mobility comments: Sitting EOB upon arrival    Transfers Overall transfer level: Independent Equipment used: None                Ambulation/Gait Ambulation/Gait assistance: Independent Gait Distance (Feet): 200 Feet Assistive device: None Gait Pattern/deviations: WFL(Within Functional Limits)        Stairs Stairs: Yes Stairs assistance: Modified independent (Device/Increase time) Stair Management: One rail Right Number of Stairs: 3 General stair comments: Step over step pattern  Wheelchair Mobility    Modified Rankin (Stroke Patients Only)       Balance Overall balance assessment: Mild deficits observed, not formally tested                                           Pertinent Vitals/Pain Pain Assessment: No/denies pain    Home Living  Family/patient expects to be discharged to:: Private residence Living Arrangements: Children (son) Available Help at Discharge: Family Type of Home: House Home Access: Stairs to enter   Technical brewer of Steps: 4 Home Layout: Able to live on main level with bedroom/bathroom        Prior Function Level of Independence: Independent         Comments: On distability, drives, community ambulatory     Hand Dominance        Extremity/Trunk Assessment   Upper Extremity Assessment Upper Extremity Assessment: Overall WFL for tasks assessed    Lower Extremity Assessment Lower Extremity Assessment: Overall WFL for tasks assessed    Cervical / Trunk Assessment Cervical / Trunk Assessment: Other exceptions Cervical / Trunk Exceptions: rounded shoulders  Communication   Communication: No difficulties  Cognition Arousal/Alertness: Awake/alert Behavior During Therapy: WFL for tasks assessed/performed Overall Cognitive Status: Within Functional Limits for tasks assessed                                        General Comments      Exercises     Assessment/Plan    PT Assessment Patent does not need any further PT services  PT Problem List         PT Treatment Interventions  PT Goals (Current goals can be found in the Care Plan section)  Acute Rehab PT Goals Patient Stated Goal: did not state PT Goal Formulation: All assessment and education complete, DC therapy    Frequency     Barriers to discharge        Co-evaluation               AM-PAC PT "6 Clicks" Mobility  Outcome Measure Help needed turning from your back to your side while in a flat bed without using bedrails?: None Help needed moving from lying on your back to sitting on the side of a flat bed without using bedrails?: None Help needed moving to and from a bed to a chair (including a wheelchair)?: None Help needed standing up from a chair using your arms (e.g.,  wheelchair or bedside chair)?: None Help needed to walk in hospital room?: None Help needed climbing 3-5 steps with a railing? : None 6 Click Score: 24    End of Session   Activity Tolerance: Patient tolerated treatment well Patient left: in bed;with call bell/phone within reach Nurse Communication: Mobility status PT Visit Diagnosis: Difficulty in walking, not elsewhere classified (R26.2)    Time: 1062-6948 PT Time Calculation (min) (ACUTE ONLY): 14 min   Charges:   PT Evaluation $PT Eval Moderate Complexity: 1 Mod          [name], PT, DPT Acute Rehabilitation Services Pager [phone] Office [phone]   [name] 06/19/2020, 1:23 PM

## 2020-06-19 NOTE — TOC Progression Note (Signed)
Transition of Care [facility]) - Progression Note    Patient Details  Name: Dean Ellison MRN: [id number] Date of Birth: March 05, 1955  [facility] CM/SW [place], RN Phone Number: [phone]  06/19/2020, 11:03 AM  Clinical Narrative:    [facility] consulted for financial needs. CM spoke with patient who denies that he has financial needs. Patient states that he is unaware of any needs. Patient states that the only thing he can think of is that he has been wearing oxygen while here at the hospital and he feels a little better with it. CM explained to patient that RN would assess his continued need for O2 and that TOC would order if patient meets criteria. Patient also states that he previously had a bipap at home but has not used it since 2014 because the company took the machine due to non payment. Cm has advised patient to follow up with PCP for this matter. Patient has [employer] and does not have any recommendation for DME or HH. TOC will continue to follow. No needs identified at this time.         Expected Discharge Plan and Services           Expected Discharge Date: 06/19/20                                     Social Determinants of Health (SDOH) Interventions    Readmission Risk Interventions No flowsheet data found.

## 2020-06-19 NOTE — Sepsis Progress Note (Signed)
Notified provider of need to order repeat lactic acid. ° °

## 2020-06-19 NOTE — Plan of Care (Signed)

## 2020-06-19 NOTE — Plan of Care (Signed)
  Problem: Education: Goal: Knowledge of General Education information will improve Description: Including pain rating scale, medication(s)/side effects and non-pharmacologic comfort measures 06/19/2020 1346 by Vira Agar, RN Outcome: Completed/Met 06/19/2020 1344 by Vira Agar, RN Outcome: Progressing   Problem: Health Behavior/Discharge Planning: Goal: Ability to manage health-related needs will improve 06/19/2020 1346 by Vira Agar, RN Outcome: Completed/Met 06/19/2020 1344 by Vira Agar, RN Outcome: Progressing   Problem: Clinical Measurements: Goal: Ability to maintain clinical measurements within normal limits will improve 06/19/2020 1346 by Vira Agar, RN Outcome: Completed/Met 06/19/2020 1344 by Vira Agar, RN Outcome: Progressing Goal: Will remain free from infection Outcome: Completed/Met Goal: Diagnostic test results will improve Outcome: Completed/Met Goal: Respiratory complications will improve Outcome: Completed/Met Goal: Cardiovascular complication will be avoided Outcome: Completed/Met   Problem: Activity: Goal: Risk for activity intolerance will decrease 06/19/2020 1346 by Vira Agar, RN Outcome: Completed/Met 06/19/2020 1344 by Vira Agar, RN Outcome: Progressing   Problem: Nutrition: Goal: Adequate nutrition will be maintained 06/19/2020 1346 by Vira Agar, RN Outcome: Completed/Met 06/19/2020 1344 by Vira Agar, RN Outcome: Progressing   Problem: Coping: Goal: Level of anxiety will decrease 06/19/2020 1346 by Vira Agar, RN Outcome: Completed/Met 06/19/2020 1344 by Vira Agar, RN Outcome: Progressing   Problem: Elimination: Goal: Will not experience complications related to bowel motility 06/19/2020 1346 by Vira Agar, RN Outcome: Completed/Met 06/19/2020 1344 by Vira Agar, RN Outcome: Progressing Goal: Will not experience complications related to urinary retention Outcome:  Completed/Met   Problem: Pain Managment: Goal: General experience of comfort will improve 06/19/2020 1346 by Vira Agar, RN Outcome: Completed/Met 06/19/2020 1344 by Vira Agar, RN Outcome: Progressing   Problem: Safety: Goal: Ability to remain free from injury will improve 06/19/2020 1346 by Vira Agar, RN Outcome: Completed/Met 06/19/2020 1344 by Vira Agar, RN Outcome: Progressing   Problem: Skin Integrity: Goal: Risk for impaired skin integrity will decrease 06/19/2020 1346 by Vira Agar, RN Outcome: Completed/Met 06/19/2020 1344 by Vira Agar, RN Outcome: Progressing

## 2020-06-23 LAB — CULTURE, BLOOD (ROUTINE X 2)
Culture: NO GROWTH
Culture: NO GROWTH
Special Requests: ADEQUATE
Special Requests: ADEQUATE

## 2020-11-27 ENCOUNTER — Ambulatory Visit
Admission: RE | Admit: 2020-11-27 | Discharge: 2020-11-27 | Disposition: A | Discharge status: Home / Self Care | Payer: Medicare HMO | Source: Ambulatory Visit | Attending: Physician Assistant | Admitting: Physician Assistant

## 2020-11-27 ENCOUNTER — Other Ambulatory Visit: Payer: Self-pay

## 2020-11-27 VITALS — BP 140/90 | HR 77 | Temp 98.5°F | Resp 18 | Ht 70.0 in | Wt 190.0 lb

## 2020-11-27 DIAGNOSIS — J449 Chronic obstructive pulmonary disease, unspecified: Secondary | ICD-10-CM

## 2020-11-27 DIAGNOSIS — R0981 Nasal congestion: Secondary | ICD-10-CM | POA: Diagnosis not present

## 2020-11-27 DIAGNOSIS — J019 Acute sinusitis, unspecified: Secondary | ICD-10-CM | POA: Diagnosis not present

## 2020-11-27 DIAGNOSIS — R490 Dysphonia: Secondary | ICD-10-CM

## 2020-11-27 DIAGNOSIS — R059 Cough, unspecified: Secondary | ICD-10-CM

## 2020-11-27 MED ORDER — LIDOCAINE VISCOUS HCL 2 % MT SOLN: 15.0000 mL | OROMUCOSAL | 0 refills | Status: DC | PRN

## 2020-11-27 MED ORDER — CLARITIN-D 12 HOUR 5-120 MG PO TB12: 1.0000 | ORAL_TABLET | Freq: Two times a day (BID) | ORAL | 0 refills | Status: AC

## 2020-11-27 MED ORDER — AMOXICILLIN-POT CLAVULANATE 875-125 MG PO TABS: 1.0000 | ORAL_TABLET | Freq: Two times a day (BID) | ORAL | 0 refills | Status: AC

## 2020-11-27 MED ORDER — FLUTICASONE PROPIONATE 50 MCG/ACT NA SUSP: 2.0000 | Freq: Every day | NASAL | 0 refills | Status: AC

## 2020-11-27 NOTE — ED Triage Notes (Signed)
Pt here with C/O left ear and left side of throat pain, for 1 month or 2 month, has an appointment with ENT on 12/08/2020. Has a lot of mucus as well.

## 2020-11-27 NOTE — ED Provider Notes (Signed)
[facility]    CSN: [id number] Arrival date & time: 11/27/20  1158      History   Chief Complaint Chief Complaint  Patient presents with   Otalgia    HPI Dean Ellison is a 65 y.o. male presenting for 1 to 30-monthhistory of sore throat, painful swallowing, left-sided ear pressure, nasal congestion and productive cough.  Patient also reports voice hoarseness for over a year.  He does have an appointment coming up with ENT specialist in about a week and a half to discuss this.  Patient denies any associated fevers or fatigue.  He has not had any sinus pain but does admit to pressure.  He denies any chest pain or breathing difficulty that is worse from his baseline.  He does have history of COPD.  Other medical history significant for CAD, arthritis, diabetes which is uncontrolled, obstructive sleep apnea, hypertension, hyperlipidemia, and presence of cardiac pacemaker.  Patient also is a current smoker since age 65  He has not recently been trying any over-the-counter medication for symptoms.  No recent antibiotics.  He has no other complaints.  HPI  Past Medical History:  Diagnosis Date   Anemia    Arthritis    CAD (coronary artery disease)    Carotid artery occlusion    COPD (chronic obstructive pulmonary disease) (HCC)    Diabetes mellitus without complication (HCC)    Dyspnea    Gout    Hyperlipidemia    Hypertension    OSA (obstructive sleep apnea)    does not use CPAP   Persistent disorder of initiating or maintaining sleep    Presence of permanent cardiac pacemaker    Vitamin D deficiency     Patient Active Problem List   Diagnosis Date Noted   Multifocal pneumonia 06/18/2020   Asymptomatic stenosis of right carotid artery without infarction 10/22/2018   Presence of permanent cardiac pacemaker    Vitamin D deficiency    Persistent disorder of initiating or maintaining sleep    OSA (obstructive sleep apnea)    Hypertension    Hyperlipidemia     Gout    Diabetes mellitus without complication (HCC)    COPD (chronic obstructive pulmonary disease) (HCC)    Carotid artery occlusion    CAD (coronary artery disease)    Arthritis    Anemia    Carotid artery stenosis, asymptomatic, right 07/18/2017    Past Surgical History:  Procedure Laterality Date   ABDOMINAL AORTOGRAM W/LOWER EXTREMITY N/A 11/16/2019   Procedure: ABDOMINAL AORTOGRAM W/LOWER EXTREMITY;  Surgeon: CWaynetta Sandy MD;  [facility];  Service: Cardiovascular;  Laterality: N/A;  Bilateral    BACK SURGERY  1991   CARDIAC CATHETERIZATION Right 2013   CATARACT EXTRACTION Bilateral 1994   CORONARY ARTERY BYPASS GRAFT  2013   PACEMAKER INSERTION     TRANSCAROTID ARTERY REVASCULARIZATION (TCAR) Right 10/22/2018   TRANSCAROTID ARTERY REVASCULARIZATION REDO (Right Neck)   TRANSCAROTID ARTERY REVASCULARIZATION  Right 07/18/2017   Procedure: TRANSCAROTID ARTERY REVASCULARIZATION;  Surgeon: CWaynetta Sandy MD;  Location: [facility]  Service: Vascular;  Laterality: Right;   TRANSCAROTID ARTERY REVASCULARIZATION  Right 10/22/2018   Procedure: TRANSCAROTID ARTERY REVASCULARIZATION REDO;  Surgeon: BSerafina Mitchell MD;  Location: [facility];  Service: Vascular;  Laterality: Right;       Home Medications    Prior to Admission medications   Medication Sig Start Date End Date Taking? Authorizing Provider  albuterol (VENTOLIN HFA) 108 (90 Base) MCG/ACT  inhaler Inhale 1-2 puffs into the lungs every 6 (six) hours as needed for wheezing or shortness of breath. 04/26/20 04/26/21 Yes Danton Clap, PA-C  amiodarone (PACERONE) 200 MG tablet Take 200 mg by mouth daily. 04/20/20  Yes [provider]  amoxicillin-clavulanate (AUGMENTIN) 875-125 MG tablet Take 1 tablet by mouth every 12 (twelve) hours for 7 days. 11/27/20 12/04/20 Yes Danton Clap, PA-C  aspirin EC 81 MG tablet Take 81 mg by mouth daily.   Yes [provider]  atenolol (TENORMIN)  50 MG tablet Take 50 mg by mouth 2 (two) times daily.    Yes [provider]  atorvastatin (LIPITOR) 40 MG tablet Take 80 mg by mouth daily.   Yes [provider]  Blood Glucose Monitoring Suppl (TRUE METRIX METER) w/Device KIT  09/30/19  Yes [provider]  Dulaglutide (TRULICITY) 3 WU/1.3KG SOPN Inject 3 mg into the skin every Tuesday.   Yes [provider]  famotidine (PEPCID) 20 MG tablet Take 20 mg by mouth 2 (two) times daily.   Yes [provider]  fluticasone (FLONASE) 50 MCG/ACT nasal spray Place 2 sprays into both nostrils daily. 11/27/20  Yes Laurene Footman B, PA-C  furosemide (LASIX) 20 MG tablet Take 20 mg by mouth daily.    Yes [provider]  glipiZIDE (GLUCOTROL) 5 MG tablet Take 5 mg by mouth daily. 09/22/18  Yes [provider]  Icosapent Ethyl (VASCEPA) 1 g CAPS Take 2 g by mouth 2 (two) times daily.   Yes [provider]  isosorbide mononitrate (IMDUR) 30 MG 24 hr tablet Take 30 mg by mouth daily. 05/20/20  Yes [provider]  latanoprost (XALATAN) 0.005 % ophthalmic solution Place 1 drop into both eyes at bedtime. 07/04/17  Yes [provider]  lidocaine (XYLOCAINE) 2 % solution Use as directed 15 mLs in the mouth or throat every 3 (three) hours as needed for mouth pain (swish and spit). 11/27/20  Yes Danton Clap, PA-C  loratadine-pseudoephedrine (CLARITIN-D 12 HOUR) 5-120 MG tablet Take 1 tablet by mouth 2 (two) times daily. 11/27/20  Yes Laurene Footman B, PA-C  nitroGLYCERIN (NITROSTAT) 0.4 MG SL tablet Place 0.4 mg under the tongue every 5 (five) minutes as needed for chest pain.   Yes [provider]  sitaGLIPtin (JANUVIA) 100 MG tablet Take 100 mg by mouth daily.   Yes [provider]  tiotropium (SPIRIVA) 18 MCG inhalation capsule Place 18 mcg into inhaler and inhale daily.    Yes [provider]  TRUE METRIX BLOOD GLUCOSE TEST test strip  09/30/19  Yes [provider]  TRUEplus Lancets 33G [place]  09/30/19  Yes [provider]  varenicline (CHANTIX) 1 MG tablet Take 1 mg by mouth 2 (two) times daily.   Yes [provider]  XARELTO 2.5 MG TABS tablet Take 2.5 mg by mouth 2 (two) times daily.  04/12/18  Yes [provider]    Family History Family History  Problem Relation Age of Onset   Heart disease Mother    Diabetes Mother    Hypertension Mother    Arthritis Mother    Heart disease Father    Diabetes Father    Hypertension Father    Arthritis Father     Social History Social History   Tobacco Use   Smoking status: Every Day    Packs/day: 0.25    Years: 40.00    Pack years: 10.00    Types: Cigarettes  Smokeless tobacco: Never   Tobacco comments:    4 cigarettes a week  Vaping Use   Vaping Use: Former  Substance Use Topics   Alcohol use: Yes    Alcohol/week: 1.0 standard drink    Types: 1 Cans of beer per week    Comment: 1 every 6 months or more   Drug use: No     Allergies   Meperidine   Review of Systems Review of Systems  Constitutional:  Negative for fatigue and fever.  HENT:  Positive for congestion, ear pain, postnasal drip, rhinorrhea, sinus pressure, sore throat and voice change. Negative for sinus pain.   Respiratory:  Positive for cough. Negative for shortness of breath.   Cardiovascular:  Negative for chest pain.  Gastrointestinal:  Negative for abdominal pain, diarrhea, nausea and vomiting.  Musculoskeletal:  Negative for myalgias.  Neurological:  Negative for weakness, light-headedness and headaches.  Hematological:  Negative for adenopathy.    Physical Exam Triage Vital Signs ED Triage Vitals  Enc Vitals Group     BP 11/27/20 1227 140/90     Pulse Rate 11/27/20 1227 77     Resp 11/27/20 1227 18     Temp 11/27/20 1227 98.5 F (36.9 C)     Temp Source 11/27/20 1227 Oral     SpO2 11/27/20 1227 97 %     Weight 11/27/20 1229 190 lb (86.2 kg)     Height 11/27/20  1229 5' 10"  (1.778 m)     Head Circumference --      Peak Flow --      Pain Score 11/27/20 1229 3     Pain Loc --      Pain Edu? --      Excl. in [place]? --    No data found.  Updated Vital Signs BP 140/90 (BP Location: Left Arm)   Pulse 77   Temp 98.5 F (36.9 C) (Oral)   Resp 18   Ht 5' 10"  (1.778 m)   Wt 190 lb (86.2 kg)   SpO2 97%   BMI 27.26 kg/m       Physical Exam Vitals and nursing note reviewed.  Constitutional:      General: He is not in acute distress.    Appearance: Normal appearance. He is well-developed. He is ill-appearing. He is not toxic-appearing or diaphoretic.  HENT:     Head: Normocephalic and atraumatic.     Right Ear: Tympanic membrane, ear canal and external ear normal.     Left Ear: Ear canal and external ear normal. A middle ear effusion is present. Tympanic membrane is injected.     Nose: Mucosal edema, congestion and rhinorrhea present. Rhinorrhea is purulent.     Mouth/Throat:     Mouth: Mucous membranes are moist.     Pharynx: Pharyngeal swelling (mild) and posterior oropharyngeal erythema present.  Eyes:     General: No scleral icterus.    Conjunctiva/sclera: Conjunctivae normal.  Cardiovascular:     Rate and Rhythm: Normal rate and regular rhythm.     Heart sounds: Normal heart sounds.  Pulmonary:     Effort: Pulmonary effort is normal. No respiratory distress.     Breath sounds: Normal breath sounds. No wheezing, rhonchi or rales.  Musculoskeletal:     Cervical back: Neck supple.  Skin:    General: Skin is warm and dry.  Neurological:     General: No focal deficit present.     Mental Status: He is alert. Mental status is  at baseline.     Motor: No weakness.     Coordination: Coordination normal.     Gait: Gait normal.  Psychiatric:        Mood and Affect: Mood normal.        Behavior: Behavior normal.        Thought Content: Thought content normal.     UC Treatments / Results  Labs (all labs ordered are listed, but only  abnormal results are displayed) Labs Reviewed - No data to display  EKG   Radiology No results found.  Procedures Procedures (including critical care time)  Medications Ordered in UC Medications - No data to display  Initial Impression / Assessment and Plan / UC Course  I have reviewed the triage vital signs and the nursing notes.  Pertinent labs & imaging results that were available during my care of the patient were reviewed by me and considered in my medical decision making (see chart for details).  65 year old male with history of COPD presenting for 1 to 64-monthhistory of sore throat, painful swallowing, left-sided ear pressure, nasal congestion and productive cough.  Patient also reports voice hoarseness for over a year.  He does have an appointment coming up with ENT specialist in about a week and a half to discuss this.   Patient is exam today is concerning for acute bacterial sinusitis.  I question also the possibility of allergies.  Treating him at this time with Augmentin.  I have also sent Flonase, viscous lidocaine and Claritin-D.  Patient has appoint with ENT specialist on October 13 to work him up for the laryngitis.  I did review with him the importance of going to the ENT appointment since he has had this voice hoarseness for over a year and is a smoker.  We discussed possibilities for the laryngitis including allergies versus more significant problems including cancers.  Advised patient to go to ED sooner for any acute worsening of any of his symptoms.  He agrees.  Final Clinical Impressions(s) / UC Diagnoses   Final diagnoses:  Acute sinusitis, recurrence not specified, unspecified location  Nasal congestion  Cough, unspecified type  Chronic obstructive pulmonary disease, unspecified COPD type [place]  Voice hoarseness     Discharge Instructions      -You may have a sinus infection.  I have sent antibiotics for you, nasal spray, lidocaine to help numb your  throat and a antihistamine/decongestant.  Increase rest and fluids.  Consider use of humidifier.  Continue using your at home breathing treatments and inhalers for COPD. -Keep appointment with ENT specialist but if you feel that your throat swelling or hoarseness is worsening or you have fever, worsening cough or breathing difficulty not relieved with your inhalers then you should call 911 or have someone take you to the emergency department.     ED Prescriptions     Medication Sig Dispense Auth. Provider   fluticasone (FLONASE) 50 MCG/ACT nasal spray Place 2 sprays into both nostrils daily. 1 g ELaurene FootmanB, PA-C   lidocaine (XYLOCAINE) 2 % solution Use as directed 15 mLs in the mouth or throat every 3 (three) hours as needed for mouth pain (swish and spit). 100 mL ELaurene FootmanB, PA-C   amoxicillin-clavulanate (AUGMENTIN) 875-125 MG tablet Take 1 tablet by mouth every 12 (twelve) hours for 7 days. 14 tablet EDanton Clap PA-C   loratadine-pseudoephedrine (CLARITIN-D 12 HOUR) 5-120 MG tablet Take 1 tablet by mouth 2 (two) times daily. 14 tablet ECarlyon Prows  Kennis Carina, PA-C      PDMP not reviewed this encounter.   Danton Clap, PA-C 11/27/20 1521

## 2020-11-27 NOTE — Discharge Instructions (Addendum)
-  You may have a sinus infection.  I have sent antibiotics for you, nasal spray, lidocaine to help numb your throat and a antihistamine/decongestant.  Increase rest and fluids.  Consider use of humidifier.  Continue using your at home breathing treatments and inhalers for COPD. -Keep appointment with ENT specialist but if you feel that your throat swelling or hoarseness is worsening or you have fever, worsening cough or breathing difficulty not relieved with your inhalers then you should call 911 or have someone take you to the emergency department.

## 2020-11-29 ENCOUNTER — Other Ambulatory Visit: Payer: Self-pay

## 2020-11-29 DIAGNOSIS — I6523 Occlusion and stenosis of bilateral carotid arteries: Secondary | ICD-10-CM

## 2020-12-14 ENCOUNTER — Ambulatory Visit: Payer: Medicare HMO

## 2020-12-14 ENCOUNTER — Encounter (HOSPITAL_COMMUNITY): Payer: Medicare HMO

## 2020-12-19 ENCOUNTER — Ambulatory Visit (INDEPENDENT_AMBULATORY_CARE_PROVIDER_SITE_OTHER): Payer: Medicare HMO

## 2020-12-19 ENCOUNTER — Ambulatory Visit
Admission: EM | Admit: 2020-12-19 | Discharge: 2020-12-19 | Disposition: A | Discharge status: Home / Self Care | Payer: Medicare HMO | Attending: Emergency Medicine | Admitting: Emergency Medicine

## 2020-12-19 ENCOUNTER — Other Ambulatory Visit: Payer: Self-pay

## 2020-12-19 ENCOUNTER — Encounter: Payer: Self-pay | Admitting: Emergency Medicine

## 2020-12-19 DIAGNOSIS — R0602 Shortness of breath: Secondary | ICD-10-CM

## 2020-12-19 DIAGNOSIS — R059 Cough, unspecified: Secondary | ICD-10-CM

## 2020-12-19 DIAGNOSIS — R051 Acute cough: Secondary | ICD-10-CM

## 2020-12-19 MED ORDER — PREDNISONE 50 MG PO TABS: ORAL_TABLET | ORAL | 0 refills | Status: DC

## 2020-12-19 MED ORDER — DOXYCYCLINE HYCLATE 100 MG PO CAPS: 100.0000 mg | ORAL_CAPSULE | Freq: Two times a day (BID) | ORAL | 0 refills | Status: AC

## 2020-12-19 NOTE — ED Provider Notes (Signed)
[facility]  ____________________________________________  Time seen: Approximately 12:21 PM  I have reviewed the triage vital signs and the nursing notes.   HISTORY  Chief Complaint Cough, Wheezing, and Shortness of Breath   Historian Patient     HPI Dean Ellison is a 65 y.o. male presents to the urgent care with cough for 1 month and wheezing.  Patient states that his wheezing is worse at night.  No fever or chills at home.  He denies current chest pain or chest tightness.  Patient denies recent admissions for COPD.   Past Medical History:  Diagnosis Date   Anemia    Arthritis    CAD (coronary artery disease)    Carotid artery occlusion    COPD (chronic obstructive pulmonary disease) (HCC)    Diabetes mellitus without complication (HCC)    Dyspnea    Gout    Hyperlipidemia    Hypertension    OSA (obstructive sleep apnea)    does not use CPAP   Persistent disorder of initiating or maintaining sleep    Presence of permanent cardiac pacemaker    Vitamin D deficiency      Immunizations up to date:  Yes.     Past Medical History:  Diagnosis Date   Anemia    Arthritis    CAD (coronary artery disease)    Carotid artery occlusion    COPD (chronic obstructive pulmonary disease) (HCC)    Diabetes mellitus without complication (HCC)    Dyspnea    Gout    Hyperlipidemia    Hypertension    OSA (obstructive sleep apnea)    does not use CPAP   Persistent disorder of initiating or maintaining sleep    Presence of permanent cardiac pacemaker    Vitamin D deficiency     Patient Active Problem List   Diagnosis Date Noted   Multifocal pneumonia 06/18/2020   Asymptomatic stenosis of right carotid artery without infarction 10/22/2018   Presence of permanent cardiac pacemaker    Vitamin D deficiency    Persistent disorder of initiating or maintaining sleep    OSA (obstructive sleep apnea)    Hypertension    Hyperlipidemia    Gout    Diabetes  mellitus without complication (HCC)    COPD (chronic obstructive pulmonary disease) (HCC)    Carotid artery occlusion    CAD (coronary artery disease)    Arthritis    Anemia    Carotid artery stenosis, asymptomatic, right 07/18/2017    Past Surgical History:  Procedure Laterality Date   ABDOMINAL AORTOGRAM W/LOWER EXTREMITY N/A 11/16/2019   Procedure: ABDOMINAL AORTOGRAM W/LOWER EXTREMITY;  Surgeon: Waynetta Sandy, MD;  Location: [place] [facility];  Service: Cardiovascular;  Laterality: N/A;  Bilateral    BACK SURGERY  1991   CARDIAC CATHETERIZATION Right 2013   CATARACT EXTRACTION Bilateral 1994   CORONARY ARTERY BYPASS GRAFT  2013   PACEMAKER INSERTION     TRANSCAROTID ARTERY REVASCULARIZATION (TCAR) Right 10/22/2018   TRANSCAROTID ARTERY REVASCULARIZATION REDO (Right Neck)   TRANSCAROTID ARTERY REVASCULARIZATION  Right 07/18/2017   Procedure: TRANSCAROTID ARTERY REVASCULARIZATION;  Surgeon: Waynetta Sandy, MD;  Location: [place];  Service: Vascular;  Laterality: Right;   TRANSCAROTID ARTERY REVASCULARIZATION  Right 10/22/2018   Procedure: TRANSCAROTID ARTERY REVASCULARIZATION REDO;  Surgeon: Serafina Mitchell, MD;  Location: [facility];  Service: Vascular;  Laterality: Right;    Prior to Admission medications   Medication Sig Start Date End Date Taking? Authorizing Provider  albuterol (  VENTOLIN HFA) 108 (90 Base) MCG/ACT inhaler Inhale 1-2 puffs into the lungs every 6 (six) hours as needed for wheezing or shortness of breath. 04/26/20 04/26/21 Yes Danton Clap, PA-C  amiodarone (PACERONE) 200 MG tablet Take 200 mg by mouth daily. 04/20/20  Yes [provider]  aspirin EC 81 MG tablet Take 81 mg by mouth daily.   Yes [provider]  atenolol (TENORMIN) 50 MG tablet Take 50 mg by mouth 2 (two) times daily.    Yes [provider]  atorvastatin (LIPITOR) 40 MG tablet Take 80 mg by mouth daily.   Yes [provider]  Blood Glucose  Monitoring Suppl (TRUE METRIX METER) w/Device KIT  09/30/19  Yes [provider]  doxycycline (VIBRAMYCIN) 100 MG capsule Take 1 capsule (100 mg total) by mouth 2 (two) times daily for 5 days. 12/19/20 12/24/20 Yes Vallarie Mare M, PA-C  Dulaglutide (TRULICITY) 3 XQ/1.1HE SOPN Inject 3 mg into the skin every Tuesday.   Yes [provider]  famotidine (PEPCID) 20 MG tablet Take 20 mg by mouth 2 (two) times daily.   Yes [provider]  fluticasone (FLONASE) 50 MCG/ACT nasal spray Place 2 sprays into both nostrils daily. 11/27/20  Yes Laurene Footman B, PA-C  furosemide (LASIX) 20 MG tablet Take 20 mg by mouth daily.    Yes [provider]  glipiZIDE (GLUCOTROL) 5 MG tablet Take 5 mg by mouth daily. 09/22/18  Yes [provider]  Icosapent Ethyl (VASCEPA) 1 g CAPS Take 2 g by mouth 2 (two) times daily.   Yes [provider]  isosorbide mononitrate (IMDUR) 30 MG 24 hr tablet Take 30 mg by mouth daily. 05/20/20  Yes [provider]  latanoprost (XALATAN) 0.005 % ophthalmic solution Place 1 drop into both eyes at bedtime. 07/04/17  Yes [provider]  lidocaine (XYLOCAINE) 2 % solution Use as directed 15 mLs in the mouth or throat every 3 (three) hours as needed for mouth pain (swish and spit). 11/27/20  Yes Danton Clap, PA-C  loratadine-pseudoephedrine (CLARITIN-D 12 HOUR) 5-120 MG tablet Take 1 tablet by mouth 2 (two) times daily. 11/27/20  Yes Laurene Footman B, PA-C  nitroGLYCERIN (NITROSTAT) 0.4 MG SL tablet Place 0.4 mg under the tongue every 5 (five) minutes as needed for chest pain.   Yes [provider]  predniSONE (DELTASONE) 50 MG tablet Take one tablet once daily for five days. 12/19/20  Yes Vallarie Mare M, PA-C  sitaGLIPtin (JANUVIA) 100 MG tablet Take 100 mg by mouth daily.   Yes [provider]  tiotropium (SPIRIVA) 18 MCG inhalation capsule Place 18 mcg into inhaler and inhale daily.    Yes [provider]  TRUE METRIX BLOOD GLUCOSE TEST test strip  09/30/19  Yes [provider]  TRUEplus Lancets 33G [place]  09/30/19  Yes [provider]  varenicline (CHANTIX) 1 MG tablet Take 1 mg by mouth 2 (two) times daily.   Yes [provider]  XARELTO 2.5 MG TABS tablet Take 2.5 mg by mouth 2 (two) times daily.  04/12/18  Yes [provider]    Allergies Meperidine  Family History  Problem Relation Age of Onset   Heart disease Mother    Diabetes Mother    Hypertension Mother    Arthritis Mother    Heart disease Father    Diabetes Father    Hypertension Father    Arthritis Father     Social History Social History  Tobacco Use   Smoking status: Every Day    Packs/day: 0.25    Years: 40.00    Pack years: 10.00    Types: Cigarettes   Smokeless tobacco: Never   Tobacco comments:    4 cigarettes a week  Vaping Use   Vaping Use: Former  Substance Use Topics   Alcohol use: Yes    Alcohol/week: 1.0 standard drink    Types: 1 Cans of beer per week    Comment: 1 every 6 months or more   Drug use: No     Review of Systems  Constitutional: No fever/chills Eyes:  No discharge ENT: No upper respiratory complaints. Respiratory: Patient has cough.  Gastrointestinal:   No nausea, no vomiting.  No diarrhea.  No constipation. Musculoskeletal: Negative for musculoskeletal pain. Skin: Negative for rash, abrasions, lacerations, ecchymosis.    ____________________________________________   PHYSICAL EXAM:  VITAL SIGNS: ED Triage Vitals [12/19/20 1154]  Enc Vitals Group     BP (!) 127/51     Pulse Rate 63     Resp      Temp 98.3 F (36.8 C)     Temp Source Oral     SpO2 92 %     Weight      Height      Head Circumference      Peak Flow      Pain Score      Pain Loc      Pain Edu?      Excl. in [place]?      Constitutional: Alert and oriented. Well appearing and in no acute distress. Eyes: Conjunctivae are normal. PERRL. EOMI. Head:  Atraumatic. ENT:      Nose: No congestion/rhinnorhea.      Mouth/Throat: Mucous membranes are moist.  Neck: No stridor.  No cervical spine tenderness to palpation. Cardiovascular: Normal rate, regular rhythm. Normal S1 and S2.  Good peripheral circulation. Respiratory: Normal respiratory effort without tachypnea or retractions.  Patient has mild wheezing at the lung bases bilaterally.  Good air entry to the bases with no decreased or absent breath sounds Gastrointestinal: Bowel sounds x 4 quadrants. Soft and nontender to palpation. No guarding or rigidity. No distention. Musculoskeletal: Full range of motion to all extremities. No obvious deformities noted Neurologic:  Normal for age. No gross focal neurologic deficits are appreciated.  Skin:  Skin is warm, dry and intact. No rash noted. Psychiatric: Mood and affect are normal for age. Speech and behavior are normal.   ____________________________________________   LABS (all labs ordered are listed, but only abnormal results are displayed)  Labs Reviewed - No data to display ____________________________________________  EKG   ____________________________________________  RADIOLOGY Unk Pinto, personally viewed and evaluated these images (plain radiographs) as part of my medical decision making, as well as reviewing the written report by the radiologist.    DG Chest 2 View  Result Date: 12/19/2020 CLINICAL DATA:  Cough, shortness of breath EXAM: CHEST - 2 VIEW COMPARISON:  06/18/2020 FINDINGS: Cardiomegaly status post median sternotomy and CABG. Left chest multi lead pacer. Both lungs are clear. Disc degenerative disease of the thoracic spine. IMPRESSION: Cardiomegaly without acute abnormality of the lungs. Electronically Signed   By: Delanna Ahmadi M.D.   On: 12/19/2020 12:42    ____________________________________________    PROCEDURES  Procedure(s) performed:     Procedures     Medications - No data to  display   ____________________________________________   INITIAL IMPRESSION / ASSESSMENT AND PLAN /  ED COURSE  Pertinent labs & imaging results that were available during my care of the patient were reviewed by me and considered in my medical decision making (see chart for details).      Assessment and Plan: Wheezing:  65 year old male presents to the urgent care with cough and wheezing that is occurred for 1 month.  Vital signs are reassuring at triage.  On physical exam, patient was alert, active and nontoxic-appearing with some expiratory wheezing.  Does have a history of COPD.  Chest x-ray showed no consolidations, opacities or infiltrates suggestive of pneumonia.  Started patient on doxycycline and prednisone.  Patient assured me that he had an albuterol inhaler at home.  I cautioned patient that if his shortness of breath or wheezing worsens that he needs to seek care at local emergency department for further care and management.    ____________________________________________  FINAL CLINICAL IMPRESSION(S) / ED DIAGNOSES  Final diagnoses:  Acute cough      NEW MEDICATIONS STARTED DURING THIS VISIT:  ED Discharge Orders          Ordered    doxycycline (VIBRAMYCIN) 100 MG capsule  2 times daily        12/19/20 1250    predniSONE (DELTASONE) 50 MG tablet        12/19/20 1250                This chart was dictated using voice recognition software/Dragon. Despite best efforts to proofread, errors can occur which can change the meaning. Any change was purely unintentional.     Lannie Fields, PA-C 12/19/20 1327

## 2020-12-19 NOTE — ED Triage Notes (Signed)
Pt presents with cough, chest congestion, wheezing  and SOB. Pt unable to tell me how long he has at the [facility]. He has a history of COPD.

## 2020-12-19 NOTE — Discharge Instructions (Signed)
Take Doxy twice daily for five days. Take Prednisone once daily for five days.

## 2020-12-30 DIAGNOSIS — C329 Malignant neoplasm of larynx, unspecified: Secondary | ICD-10-CM | POA: Insufficient documentation

## 2021-01-04 ENCOUNTER — Encounter (HOSPITAL_COMMUNITY): Payer: Medicare HMO

## 2021-01-04 ENCOUNTER — Ambulatory Visit: Payer: Medicare HMO

## 2021-01-26 ENCOUNTER — Ambulatory Visit (HOSPITAL_COMMUNITY)
Admission: RE | Admit: 2021-01-26 | Discharge: 2021-01-26 | Disposition: A | Discharge status: Home / Self Care | Payer: Medicare HMO | Source: Ambulatory Visit | Attending: Vascular Surgery | Admitting: Vascular Surgery

## 2021-01-26 ENCOUNTER — Ambulatory Visit (INDEPENDENT_AMBULATORY_CARE_PROVIDER_SITE_OTHER): Payer: Medicare HMO | Admitting: Physician Assistant

## 2021-01-26 ENCOUNTER — Other Ambulatory Visit: Payer: Self-pay

## 2021-01-26 VITALS — BP 119/71 | HR 90 | Temp 98.6°F | Resp 20 | Ht 70.0 in | Wt 190.0 lb

## 2021-01-26 DIAGNOSIS — I739 Peripheral vascular disease, unspecified: Secondary | ICD-10-CM | POA: Diagnosis not present

## 2021-01-26 DIAGNOSIS — I6523 Occlusion and stenosis of bilateral carotid arteries: Secondary | ICD-10-CM | POA: Diagnosis present

## 2021-01-26 NOTE — Progress Notes (Signed)
Office Note     CC:  follow up Requesting Provider:  Secundino Ginger, PA-C  HPI: Dean Ellison is a 65 y.o. (09-Apr-1955) male who presents for follow up of PAD and Carotid artery stenosis. He was last seen in August of 2021 at which time he was having some lower extremity claudication. He was subsequently scheduled for Angiography to further evaluate this. He had his Aortogram on 11/16/19 by Dr. Donzetta Matters. There was no significant stenosis requiring intervention. He reports that his legs have actually been feeling better. He says he attributes this somewhat to changing his foot wear. He also is not ambulating as much as he gets very short winded. 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 He starts radiation tomorrow 01/27/21. He denies any rest pain or non healing wounds  He is s/p redo of right transcarotid artery stenting. This was done on 10/22/18 by Dr. Donzetta Matters Dr. Trula Slade for asymptomatic right carotid in stent restenosis. He remains without any symptoms of TIA or stroke. He denies any vision changes, slurred speech, facial drooping, weakness or numbness of upper or lower extremities.   The pt is on a statin for cholesterol management The pt is on a daily aspirin.   Other AC:  Xarelto The pt is on BB, ACE for hypertension.   The pt is diabetic.  Tobacco hx: prior smoker, recently quit, October 25th, 2022  Past Medical History:  Diagnosis Date   Anemia    Arthritis    CAD (coronary artery disease)    Carotid artery occlusion    COPD (chronic obstructive pulmonary disease) (HCC)    Diabetes mellitus without complication (HCC)    Dyspnea    Gout    Hyperlipidemia    Hypertension    OSA (obstructive sleep apnea)    does not use CPAP   Persistent disorder of initiating or maintaining sleep    Presence of permanent cardiac pacemaker    Vitamin D deficiency     Past Surgical History:  Procedure Laterality Date   ABDOMINAL AORTOGRAM W/LOWER EXTREMITY N/A 11/16/2019    Procedure: ABDOMINAL AORTOGRAM W/LOWER EXTREMITY;  Surgeon: Waynetta Sandy, MD;  Location: [place] [facility];  Service: Cardiovascular;  Laterality: N/A;  Bilateral    BACK SURGERY  1991   CARDIAC CATHETERIZATION Right 2013   CATARACT EXTRACTION Bilateral 1994   CORONARY ARTERY BYPASS GRAFT  2013   PACEMAKER INSERTION     TRANSCAROTID ARTERY REVASCULARIZATION (TCAR) Right 10/22/2018   TRANSCAROTID ARTERY REVASCULARIZATION REDO (Right Neck)   TRANSCAROTID ARTERY REVASCULARIZATION  Right 07/18/2017   Procedure: TRANSCAROTID ARTERY REVASCULARIZATION;  Surgeon: Waynetta Sandy, MD;  [facility];  Service: Vascular;  Laterality: Right;   TRANSCAROTID ARTERY REVASCULARIZATION  Right 10/22/2018   Procedure: TRANSCAROTID ARTERY REVASCULARIZATION REDO;  Surgeon: Serafina Mitchell, MD;  Location: [facility];  Service: Vascular;  Laterality: Right;    Social History   Socioeconomic History   Marital status: Legally Separated    Spouse name: Not on file   Number of children: Not on file   Years of education: Not on file   Highest education level: Not on file  Occupational History   Not on file  Tobacco Use   Smoking status: Former    Packs/day: 0.25    Years: 40.00    Pack years: 10.00    Types: Cigarettes    Quit date: 12/20/2020    Years since quitting: 0.1   Smokeless tobacco: Never  Tobacco comments:    4 cigarettes a week  Vaping Use   Vaping Use: Former  Substance and Sexual Activity   Alcohol use: Yes    Alcohol/week: 1.0 standard drink    Types: 1 Cans of beer per week    Comment: 1 every 6 months or more   Drug use: No   Sexual activity: Not on file  Other Topics Concern   Not on file  Social History Narrative   Not on file   Social Determinants of Health   Financial Resource Strain: Not on file  Food Insecurity: Not on file  Transportation Needs: Not on file  Physical Activity: Not on file  Stress: Not on file  Social Connections: Not on  file  Intimate Partner Violence: Not on file    Family History  Problem Relation Age of Onset   Heart disease Mother    Diabetes Mother    Hypertension Mother    Arthritis Mother    Heart disease Father    Diabetes Father    Hypertension Father    Arthritis Father     Current Outpatient Medications  Medication Sig Dispense Refill   albuterol (VENTOLIN HFA) 108 (90 Base) MCG/ACT inhaler Inhale 1-2 puffs into the lungs every 6 (six) hours as needed for wheezing or shortness of breath. 1 g 0   amiodarone (PACERONE) 200 MG tablet Take 200 mg by mouth daily.     aspirin EC 81 MG tablet Take 81 mg by mouth daily.     atenolol (TENORMIN) 50 MG tablet Take 50 mg by mouth 2 (two) times daily.      atorvastatin (LIPITOR) 40 MG tablet Take 80 mg by mouth daily.     Blood Glucose Monitoring Suppl (TRUE METRIX METER) w/Device KIT      Dulaglutide (TRULICITY) 3 GO/7.7CH SOPN Inject 3 mg into the skin every Tuesday.     famotidine (PEPCID) 20 MG tablet Take 20 mg by mouth 2 (two) times daily.     fluticasone (FLONASE) 50 MCG/ACT nasal spray Place 2 sprays into both nostrils daily. 1 g 0   furosemide (LASIX) 20 MG tablet Take 20 mg by mouth daily.      glipiZIDE (GLUCOTROL) 5 MG tablet Take 5 mg by mouth daily.     Icosapent Ethyl (VASCEPA) 1 g CAPS Take 2 g by mouth 2 (two) times daily.     isosorbide mononitrate (IMDUR) 30 MG 24 hr tablet Take 30 mg by mouth daily.     latanoprost (XALATAN) 0.005 % ophthalmic solution Place 1 drop into both eyes at bedtime.  5   lidocaine (XYLOCAINE) 2 % solution Use as directed 15 mLs in the mouth or throat every 3 (three) hours as needed for mouth pain (swish and spit). 100 mL 0   loratadine-pseudoephedrine (CLARITIN-D 12 HOUR) 5-120 MG tablet Take 1 tablet by mouth 2 (two) times daily. 14 tablet 0   nitroGLYCERIN (NITROSTAT) 0.4 MG SL tablet Place 0.4 mg under the tongue every 5 (five) minutes as needed for chest pain.     predniSONE (DELTASONE) 50 MG tablet  Take one tablet once daily for five days. 5 tablet 0   sitaGLIPtin (JANUVIA) 100 MG tablet Take 100 mg by mouth daily.     tiotropium (SPIRIVA) 18 MCG inhalation capsule Place 18 mcg into inhaler and inhale daily.      TRUE METRIX BLOOD GLUCOSE TEST test strip      TRUEplus Lancets 33G MISC  varenicline (CHANTIX) 1 MG tablet Take 1 mg by mouth 2 (two) times daily.     XARELTO 2.5 MG TABS tablet Take 2.5 mg by mouth 2 (two) times daily.      No current facility-administered medications for this visit.    Allergies  Allergen Reactions   Meperidine Other (See Comments)    Demerol. Pt stated "it made me crazy"     REVIEW OF SYSTEMS:   _0  denotes positive finding, _1  denotes negative finding Cardiac  Comments:  Chest pain or chest pressure:    Shortness of breath upon exertion:    Short of breath when lying flat:    Irregular heart rhythm:        Vascular    Pain in calf, thigh, or hip brought on by ambulation:    Pain in feet at night that wakes you up from your sleep:     Blood clot in your veins:    Leg swelling:         Pulmonary    Oxygen at home:    Productive cough:     Wheezing:         Neurologic    Sudden weakness in arms or legs:     Sudden numbness in arms or legs:     Sudden onset of difficulty speaking or slurred speech:    Temporary loss of vision in one eye:     Problems with dizziness:         Gastrointestinal    Blood in stool:     Vomited blood:         Genitourinary    Burning when urinating:     Blood in urine:        Psychiatric    Major depression:         Hematologic    Bleeding problems:    Problems with blood clotting too easily:        Skin    Rashes or ulcers:        Constitutional    Fever or chills:      PHYSICAL EXAMINATION:  Vitals:   01/26/21 1004 01/26/21 1005  BP: 137/66 119/71  Pulse: 90   Resp: 20   Temp: 98.6 F (37 C)   TempSrc: Temporal   SpO2: 97%   Weight: 190 lb (86.2 kg)   Height: _2  (1.778  m)     General:  WDWN in NAD; vital signs documented above Gait: Normal HENT: WNL, normocephalic Pulmonary: normal non-labored breathing , without Rales, rhonchi,  wheezing Cardiac: regular HR, without  Murmurs without carotid bruit Abdomen: soft, NT, no masses Vascular Exam/Pulses:  Right Left  Radial 2+ (normal) 2+ (normal)  Femoral 2+ (normal) 2+ (normal)  Popliteal Not palpable Not palpable  DP absent absent  PT absent 2+ (normal)   Extremities: without ischemic changes, without Gangrene , without cellulitis; without open wounds;  Musculoskeletal: no muscle wasting or atrophy  Neurologic: A&O X 3;  No focal weakness or paresthesias are detected Psychiatric:  The pt has Normal affect.   Non-Invasive Vascular Imaging:   Carotid duplex: 01/26/21 Summary:  Right Carotid: The ECA appears >50% stenosed. Patent stent with no evidence for restenosis.   Left Carotid: Velocities in the left ICA are consistent with a 60-79% stenosis.   Vertebrals:  Bilateral vertebral arteries demonstrate antegrade flow.  Subclavians: Normal flow hemodynamics were seen in bilateral subclavian arteries.    ASSESSMENT/PLAN:: 65 y.o. male here for follow up  of PAD and Carotid artery stenosis. His lower extremity claudication symptoms are improved. He underwent Angiogram in August of 2021 and no intervention was indicated. He is s/p redo of right transcarotid artery stenting. This was done on 10/22/18 by Dr. Donzetta Matters Dr. Trula Slade for asymptomatic right carotid in stent restenosis. He remains without any associated symptoms. - his carotid duplex today shows slight increase in velocities in the left ICA. He is now in the 60-79% stenosis range compared to his prior study. His right ICA stent is patent. Bilateral vertebral arteries and subclavian arteries are patent without stenosis - continue Aspirin, statin, Xarelto - reviewed signs and symptoms of TIA/ Stroke and he understands should these occur to call 911 or go  to ER - He will follow up earlier if he has any new or concerning lower extremity symptoms -He will follow up in 6 months with repeat carotid duplex and ABIs   Karoline Caldwell, PA-C [facility] [phone]  Clinic MD:   [place]/ Donzetta Matters

## 2021-01-27 ENCOUNTER — Other Ambulatory Visit: Payer: Self-pay

## 2021-01-27 DIAGNOSIS — C329 Malignant neoplasm of larynx, unspecified: Secondary | ICD-10-CM

## 2021-01-27 DIAGNOSIS — I739 Peripheral vascular disease, unspecified: Secondary | ICD-10-CM

## 2021-01-27 DIAGNOSIS — I6523 Occlusion and stenosis of bilateral carotid arteries: Secondary | ICD-10-CM

## 2021-01-30 ENCOUNTER — Other Ambulatory Visit: Payer: Self-pay | Admitting: Oncology

## 2021-01-30 ENCOUNTER — Telehealth: Payer: Self-pay | Admitting: Genetic Counselor

## 2021-01-30 DIAGNOSIS — C329 Malignant neoplasm of larynx, unspecified: Secondary | ICD-10-CM

## 2021-01-30 NOTE — Progress Notes (Signed)
Dean Ellison  [address] [place],  [place]  [address] [phone]  Clinic Day:  01/30/2021  Referring physician: Secundino Ginger, PA-C  This document serves as a record of services personally performed by Hosie Poisson, MD. It was created on their behalf by [facility], a trained medical scribe. The creation of this record is based on the scribe's personal observations and the provider's statements to them.  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 He has a large left laryngeal mass measuring 2.3 cm x 2.1 cm x 2.0 cm, with suspected infraglottic extension and probable involvement of the left thyroid cartilage with suspected extralaryngeal extension. We reviewed treatment options, but ultimately, to be cured, he will need to undergo surgical resection. He may need adjuvant radiation, but is not a candidate for chemotherapy.   Necrotic left level III lymph node, measuring 0.7 x 1.5 cm, consistent with malignant nodal spread of disease.  Extensive cardiovascular disease with history of coronary artery bypass graft and carotid artery stenting as well as a pacemaker. His cardiologist has him scheduled for a stress test preoperatively.   This is a pleasant 65 year old male recently diagnosed with invasive squamous cell carcinoma of the left vocal cord. We reviewed treatment options today, and I explained that in order to cure him, he will need to undergo surgical resection. Adjuvant therapy with radiation will likely be recommended. He has been seen in consultation by Dr. Nicolette Bang of [facility], who is an expert in this area. I concur with this treatment plan, and assured the patient that this is the correct path. He will continue to follow with them, and so I will not schedule him for a follow up appointment here. However, we will be glad to provide any local oncology support he may  require.  He understands and agrees with this plan of care.  Thank you for the opportunity to participate in the care of your patients  I provided 30 minutes of face-to-face time during this this encounter and > 50% was spent counseling as documented under my assessment and plan.    Derwood Kaplan, MD [facility] [address] [place] [address] Dept: [phone] Dept Fax: [phone]    CHIEF COMPLAINT:  CC: Invasive squamous cell carcinoma of the vocal cord  Current Treatment:  Plans for total laryngectomy and bilateral neck dissection   HISTORY OF PRESENT ILLNESS:  Dean Ellison is a 65 y.o. male referred by Dr. Nicolette Bang for a second opinion of invasive squamous cell carcinoma of the vocal cord. This began when the patient started to experience hoarseness over a year ago. CT neck from October revealed a left laryngeal mass measuring 2.3 cm x 2.1 cm x 2.0 cm, with suspected infraglottic extension consistent with malignancy. This encases the arytenoid cartilage and involves the cricoarytenoid region. There is probable involvement of the left thyroid cartilage with suspected extralaryngeal extension. There was a single necrotic left level III lymph node consistent with malignant nodal spread of disease. He was seen at [facility] in [place] by Dr. 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 He underwent tracheotomy and feeding tube placement on November 2nd. Biopsy was obtained and pathology revealed invasive squamous cell carcinoma. Surgical resection has been recommended as well as possibly adjuvant  radiation. He has already seen Dr. Gatha Mayer, Radiation Oncology, in consultation.  His cardiologist and Dr. Nicolette Bang both feel that he could no tolerate chemotherapy.  INTERVAL HISTORY:  I have reviewed his chart and materials related to his cancer  extensively and collaborated history with the patient. Summary of oncologic history is as follows: Oncology History   No history exists.    Dean Ellison states that he has had no pain or dysphagia. His only issue has been hoarseness. He does have a pacemaker and follows with a cardiologist routinely for extensive cardiovascular disease including peripheral vascular disease and carotid stenosis. He has had bypass surgery in 2013. He denies other complaints. Blood counts are unremarkable except for a hemoglobin of 13.4. Chemistries are unremarkable except for a BUN of 24 and a creatinine of 1.6, previously 1.46 on November 5th. His  appetite is good, and he is eating well. He has not had any significant weight loss. However, he has lost nearly 9 pounds since December 1st.  He denies fever, chills or other signs of infection.  He denies nausea, vomiting, bowel issues, or abdominal pain.  He denies sore throat, cough, dyspnea, or chest pain.  HISTORY:   Past Medical History:  Diagnosis Date   Anemia    Arthritis    CAD (coronary artery disease)    Carotid artery occlusion    COPD (chronic obstructive pulmonary disease) (HCC)    Diabetes mellitus without complication (HCC)    Dyspnea    Gout    Hyperlipidemia    Hypertension    OSA (obstructive sleep apnea)    does not use CPAP   Persistent disorder of initiating or maintaining sleep    Presence of permanent cardiac pacemaker    Vitamin D deficiency     Past Surgical History:  Procedure Laterality Date   ABDOMINAL AORTOGRAM W/LOWER EXTREMITY N/A 11/16/2019   Procedure: ABDOMINAL AORTOGRAM W/LOWER EXTREMITY;  Surgeon: Waynetta Sandy, MD;  Location: [facility];  Service: Cardiovascular;  Laterality: N/A;  Bilateral    BACK SURGERY  1991   CARDIAC CATHETERIZATION Right 2013   CATARACT EXTRACTION Bilateral 1994   CORONARY ARTERY BYPASS GRAFT  2013   PACEMAKER INSERTION     TRANSCAROTID ARTERY REVASCULARIZATION (TCAR) Right  10/22/2018   TRANSCAROTID ARTERY REVASCULARIZATION REDO (Right Neck)   TRANSCAROTID ARTERY REVASCULARIZATION  Right 07/18/2017   Procedure: TRANSCAROTID ARTERY REVASCULARIZATION;  Surgeon: Waynetta Sandy, MD;  Location[facility];  Service: Vascular;  Laterality: Right;   TRANSCAROTID ARTERY REVASCULARIZATION  Right 10/22/2018   Procedure: TRANSCAROTID ARTERY REVASCULARIZATION REDO;  Surgeon: Serafina Mitchell, MD;  Location: [facility] OR;  Service: Vascular;  Laterality: Right;    Family History  Problem Relation Age of Onset   Heart disease Mother    Diabetes Mother    Hypertension Mother    Arthritis Mother    Heart disease Father    Diabetes Father    Hypertension Father    Arthritis Father   S-breast 80s  Social History:  reports that he quit smoking about 5 weeks ago. His smoking use included cigarettes. He has a 10.00 pack-year smoking history. He has never used smokeless tobacco. He reports current alcohol use of about 1.0 standard drink per week. He reports that he does not use drugs.The patient is alone today. He does have children.  Allergies:  Allergies  Allergen Reactions   Meperidine Other (See Comments)    Demerol. Pt stated "it made me crazy"  Current Medications: Current Outpatient Medications  Medication Sig Dispense Refill   albuterol (VENTOLIN HFA) 108 (90 Base) MCG/ACT inhaler Inhale 1-2 puffs into the lungs every 6 (six) hours as needed for wheezing or shortness of breath. 1 g 0   amiodarone (PACERONE) 200 MG tablet Take 200 mg by mouth daily.     aspirin EC 81 MG tablet Take 81 mg by mouth daily.     atenolol (TENORMIN) 50 MG tablet Take 50 mg by mouth 2 (two) times daily.      atorvastatin (LIPITOR) 40 MG tablet Take 80 mg by mouth daily.     Blood Glucose Monitoring Suppl (TRUE METRIX METER) w/Device KIT      Dulaglutide (TRULICITY) 3 HY/0.7PX SOPN Inject 3 mg into the skin every Tuesday.     famotidine (PEPCID) 20 MG tablet Take 20 mg by mouth 2 (two)  times daily.     fluticasone (FLONASE) 50 MCG/ACT nasal spray Place 2 sprays into both nostrils daily. 1 g 0   furosemide (LASIX) 20 MG tablet Take 20 mg by mouth daily.      glipiZIDE (GLUCOTROL) 5 MG tablet Take 5 mg by mouth daily.     Icosapent Ethyl (VASCEPA) 1 g CAPS Take 2 g by mouth 2 (two) times daily.     isosorbide mononitrate (IMDUR) 30 MG 24 hr tablet Take 30 mg by mouth daily.     latanoprost (XALATAN) 0.005 % ophthalmic solution Place 1 drop into both eyes at bedtime.  5   lidocaine (XYLOCAINE) 2 % solution Use as directed 15 mLs in the mouth or throat every 3 (three) hours as needed for mouth pain (swish and spit). 100 mL 0   loratadine-pseudoephedrine (CLARITIN-D 12 HOUR) 5-120 MG tablet Take 1 tablet by mouth 2 (two) times daily. 14 tablet 0   nitroGLYCERIN (NITROSTAT) 0.4 MG SL tablet Place 0.4 mg under the tongue every 5 (five) minutes as needed for chest pain.     predniSONE (DELTASONE) 50 MG tablet Take one tablet once daily for five days. 5 tablet 0   sitaGLIPtin (JANUVIA) 100 MG tablet Take 100 mg by mouth daily.     tiotropium (SPIRIVA) 18 MCG inhalation capsule Place 18 mcg into inhaler and inhale daily.      TRUE METRIX BLOOD GLUCOSE TEST test strip      TRUEplus Lancets 33G MISC      varenicline (CHANTIX) 1 MG tablet Take 1 mg by mouth 2 (two) times daily.     XARELTO 2.5 MG TABS tablet Take 2.5 mg by mouth 2 (two) times daily.      No current facility-administered medications for this visit.    REVIEW OF SYSTEMS:  Review of Systems  Constitutional: Negative.  Negative for appetite change, chills, fatigue, fever and unexpected weight change.  HENT:   Positive for voice change (hoarseness).   Eyes: Negative.   Respiratory: Negative.  Negative for chest tightness, cough, hemoptysis, shortness of breath and wheezing.   Cardiovascular: Negative.  Negative for chest pain, leg swelling and palpitations.  Gastrointestinal: Negative.  Negative for abdominal distention,  abdominal pain, blood in stool, constipation, diarrhea, nausea and vomiting.  Endocrine: Negative.   Genitourinary: Negative.  Negative for difficulty urinating, dysuria, frequency and hematuria.   Musculoskeletal: Negative.  Negative for arthralgias, back pain, flank pain, gait problem and myalgias.  Skin: Negative.   Neurological: Negative.  Negative for dizziness, extremity weakness, gait problem, headaches, light-headedness, numbness, seizures and speech difficulty.  Hematological: Negative.  Psychiatric/Behavioral: Negative.  Negative for depression and sleep disturbance. The patient is not nervous/anxious.   All other systems reviewed and are negative.    VITALS:  There were no vitals taken for this visit.  Wt Readings from Last 3 Encounters:  01/26/21 190 lb (86.2 kg)  11/27/20 190 lb (86.2 kg)  06/18/20 200 lb (90.7 kg)    There is no height or weight on file to calculate BMI.  Performance status (ECOG): 1 - Symptomatic but completely ambulatory  PHYSICAL EXAM:  Physical Exam Constitutional:      General: He is not in acute distress.    Appearance: Normal appearance. He is normal weight.  HENT:     Head: Normocephalic and atraumatic.  Eyes:     General: No scleral icterus.    Extraocular Movements: Extraocular movements intact.     Conjunctiva/sclera: Conjunctivae normal.     Pupils: Pupils are equal, round, and reactive to light.  Neck:     Comments: Tracheostomy present and functioning well Cardiovascular:     Rate and Rhythm: Normal rate and regular rhythm.     Pulses: Normal pulses.     Heart sounds: Normal heart sounds. No murmur heard.   No friction rub. No gallop.  Pulmonary:     Effort: Pulmonary effort is normal. No respiratory distress.     Breath sounds: Normal breath sounds.  Abdominal:     General: Bowel sounds are normal. There is no distension.     Palpations: Abdomen is soft. There is no hepatomegaly, splenomegaly or mass.     Tenderness: There  is no abdominal tenderness.     Comments: Feeding tube in the epigastrium  Musculoskeletal:        General: Normal range of motion.     Cervical back: Normal range of motion and neck supple.     Right lower leg: No edema.     Left lower leg: No edema.  Lymphadenopathy:     Cervical: No cervical adenopathy.  Skin:    General: Skin is warm and dry.  Neurological:     General: No focal deficit present.     Mental Status: He is alert and oriented to person, place, and time. Mental status is at baseline.  Psychiatric:        Mood and Affect: Mood normal.        Behavior: Behavior normal.        Thought Content: Thought content normal.        Judgment: Judgment normal.      LABS:   CBC Latest Ref Rng & Units 06/19/2020 06/18/2020 11/16/2019  WBC 4.0 - 10.5 K/uL 10.9(H) 16.4(H) -  Hemoglobin 13.0 - 17.0 g/dL 12.3(L) 14.1 14.6  Hematocrit 39.0 - 52.0 % 37.7(L) 44.0 43.0  Platelets 150 - 400 K/uL 163 197 -   CMP Latest Ref Rng & Units 06/19/2020 06/18/2020 11/16/2019  Glucose 70 - 99 mg/dL 326(H) 304(H) 197(H)  BUN 8 - 23 mg/dL 15 20 25(H)  Creatinine 0.61 - 1.24 mg/dL 1.45(H) 1.93(H) 1.60(H)  Sodium 135 - 145 mmol/L 133(L) 132(L) 140  Potassium 3.5 - 5.1 mmol/L 4.3 4.5 4.6  Chloride 98 - 111 mmol/L 97(L) 95(L) 99  CO2 22 - 32 mmol/L 26 23 -  Calcium 8.9 - 10.3 mg/dL 8.6(L) 9.1 -  Total Protein 6.5 - 8.1 g/dL - 6.3(L) -  Total Bilirubin 0.3 - 1.2 mg/dL - 0.7 -  Alkaline Phos 38 - 126 U/L - 58 -  AST 15 -  41 U/L - 23 -  ALT 0 - 44 U/L - 17 -    STUDIES:  VAS US CAROTID  Result Date: 01/26/2021 Carotid Arterial Duplex Study Patient Name:  Dean Ellison  Date of Exam:   01/26/2021 Medical Rec #: [id number]        Accession #:    [id number] Date of Birth: 06-Aug-1955        Patient Gender: M Patient Age:   89 years Exam Location:  [facility] Procedure:      VAS US CAROTID Referring Phys: Servando Snare  --------------------------------------------------------------------------------  Indications:       Redo right transcranial artery stenting 10/22/2018 Risk Factors:      Hypertension, hyperlipidemia, Diabetes, coronary artery                    disease. Trach Comparison Study:  Increased velocity in the left ICA since prior exam of                    09/11/2019 (40 - 59% stenosis). Performing Technologist: Alvia Grove RVT  Examination Guidelines: A complete evaluation includes B-mode imaging, spectral Doppler, color Doppler, and power Doppler as needed of all accessible portions of each vessel. Bilateral testing is considered an integral part of a complete examination. Limited examinations for reoccurring indications may be performed as noted.  Right Carotid Findings: +----------+--------+--------+--------+------------------+--------+           PSV cm/sEDV cm/sStenosisPlaque DescriptionComments +----------+--------+--------+--------+------------------+--------+ CCA Prox  62      16                                         +----------+--------+--------+--------+------------------+--------+ CCA Mid   61      12              heterogenous               +----------+--------+--------+--------+------------------+--------+ CCA Distal                        heterogenous      stent    +----------+--------+--------+--------+------------------+--------+ ICA Prox                                            stent    +----------+--------+--------+--------+------------------+--------+ ICA Mid   97      18                                         +----------+--------+--------+--------+------------------+--------+ ICA Distal85      21                                         +----------+--------+--------+--------+------------------+--------+ ECA       241     14      >50%                               +----------+--------+--------+--------+------------------+--------+  +----------+--------+-------+----------------+-------------------+           PSV cm/sEDV cmsDescribe  Arm Pressure (mmHG) +----------+--------+-------+----------------+-------------------+ Subclavian209     4      Multiphasic, WNL                    +----------+--------+-------+----------------+-------------------+ +---------+--------+--+--------+--+---------+ VertebralPSV cm/s37EDV cm/s11Antegrade +---------+--------+--+--------+--+---------+  Right Stent(s): +---------------+---+--++++ Prox to Stent  98 20 +---------------+---+--++++ Proximal Stent 11221 +---------------+---+--++++ Mid Stent      91 20 +---------------+---+--++++ Distal Stent   84 20 +---------------+---+--++++ Distal to [id number] +---------------+---+--++++   Left Carotid Findings: +----------+--------+--------+--------+------------------+--------+           PSV cm/sEDV cm/sStenosisPlaque DescriptionComments +----------+--------+--------+--------+------------------+--------+ CCA Prox  106     13              heterogenous               +----------+--------+--------+--------+------------------+--------+ CCA Mid   115     16              heterogenous               +----------+--------+--------+--------+------------------+--------+ CCA Distal100     15              heterogenous               +----------+--------+--------+--------+------------------+--------+ ICA Prox  265     70      60-79%  calcific                   +----------+--------+--------+--------+------------------+--------+ ICA Mid   174     27                                         +----------+--------+--------+--------+------------------+--------+ ICA Distal124     28                                         +----------+--------+--------+--------+------------------+--------+ ECA       104     5               heterogenous                +----------+--------+--------+--------+------------------+--------+ +----------+--------+--------+----------------+-------------------+           PSV cm/sEDV cm/sDescribe        Arm Pressure (mmHG) +----------+--------+--------+----------------+-------------------+ [id number]     7       Multiphasic, WNL                    +----------+--------+--------+----------------+-------------------+ +---------+--------+--+--------+--+---------+ VertebralPSV cm/s65EDV cm/s14Antegrade +---------+--------+--+--------+--+---------+   Summary: Right Carotid: The ECA appears >50% stenosed. Patent stent with no evidence for                restenosis. Left Carotid: Velocities in the left ICA are consistent with a 60-79% stenosis. Vertebrals:  Bilateral vertebral arteries demonstrate antegrade flow. Subclavians: Normal flow hemodynamics were seen in bilateral subclavian              arteries. *See table(s) above for measurements and observations.  Electronically signed by Deitra Mayo MD on 01/26/2021 at 9:57:40 AM.    Final      EXAM: 12/15/2020 CT NECK WITH CONTRAST   TECHNIQUE:  Multidetector CT imaging of the neck was performed using the  standard protocol following the bolus administration of intravenous  contrast.   CONTRAST:  75 cc Omnipaque 350  COMPARISON:  CTA neck 08/31/2018   FINDINGS:  Pharynx and larynx: The nasal cavity and nasopharynx are normal. The  oral cavity and oropharynx are normal.   There is a bulky soft tissue mass centered in the left larynx with  involvement of the false and true vocal folds. The mass measures  approximately 2.3 cm AP by 2.1 cm TV by 2.0 cm cc. There is  effacement of the laryngeal ventricle. There is effacement of the  paraglottic fat on the left. There is involvement of the anterior  commissure (2-88).   The mass encases the arytenoid cartilage and involves the  cricoarytenoid joint. The left lamina of the thyroid cartilage is   likely involved given asymmetric lytic appearance. There is possible  extra laryngeal extension on the left (2-85, 5-40).   There is thickening of the infraglottic soft tissues on the left  raising concern for infraglottic extension (5-41).   The epiglottis appears spared.   Salivary glands: The parotid and submandibular glands are  unremarkable.   Thyroid: Unremarkable.   Lymph nodes: There is a necrotic appearing left level III lymph node  measuring 0.7 cm by 1.5 cm (2-78). There is no other evidence of  pathologic lymphadenopathy in the neck.   Vascular: A right carotid stent is in place. The stent appears  patent. There is bulky calcified atherosclerotic plaque in the left  internal carotid artery without evidence of occlusion. The jugular  veins are patent.   Limited intracranial: The imaged portions of the intracranial  compartment are unremarkable.   Visualized orbits: Bilateral lens implants are in place. The globes  and orbits are otherwise unremarkable.   Mastoids and visualized paranasal sinuses: There is mild mucosal  thickening in the right maxillary sinus. The mastoid air cells are  clear.   Skeleton: There is fusion of the C3 and C4 vertebral bodies. There  is no acute osseous abnormality or aggressive osseous lesion.   Upper chest: There is emphysema in the lung apices and biapical  scarring. Median sternotomy wires are noted.   Other: None.   IMPRESSION:  1. Left laryngeal mass with suspected infraglottic extension  consistent with malignancy as described in detail above. There is  probable involvement of the left thyroid cartilage with suspected  extralaryngeal extension.  2. Single necrotic left level III lymph node consistent with  malignant nodal spread of disease   Final Pathologic Diagnosis   12/28/2020  A.  VOCAL CORD, LEFT TRUE, BIOPSY:              INVASIVE SQUAMOUS CELL CARCINOMA.  B.  VOCAL FOLD, LEFT TRUE, BIOPSY:               INVASIVE SQUAMOUS CELL CARCINOMA.      I, Rita Ohara, am acting as scribe for Derwood Kaplan, MD  I have reviewed this report as typed by the medical scribe, and it is complete and accurate.

## 2021-01-30 NOTE — Telephone Encounter (Signed)
Scheduled appt per 12/2 referral. Pt's son, [name], is aware of appt. He said he would prefer a virtual visit.

## 2021-01-31 ENCOUNTER — Encounter: Payer: Self-pay | Admitting: Oncology

## 2021-01-31 ENCOUNTER — Other Ambulatory Visit: Payer: Self-pay | Admitting: Hematology and Oncology

## 2021-01-31 ENCOUNTER — Inpatient Hospital Stay: Payer: Medicare HMO | Attending: Oncology | Admitting: Oncology

## 2021-01-31 ENCOUNTER — Inpatient Hospital Stay: Payer: Medicare HMO

## 2021-01-31 VITALS — BP 145/70 | HR 63 | Temp 98.5°F | Resp 18 | Ht 70.0 in | Wt 181.2 lb

## 2021-01-31 DIAGNOSIS — C320 Malignant neoplasm of glottis: Secondary | ICD-10-CM | POA: Diagnosis not present

## 2021-01-31 DIAGNOSIS — C779 Secondary and unspecified malignant neoplasm of lymph node, unspecified: Secondary | ICD-10-CM | POA: Diagnosis not present

## 2021-01-31 DIAGNOSIS — I2510 Atherosclerotic heart disease of native coronary artery without angina pectoris: Secondary | ICD-10-CM | POA: Diagnosis not present

## 2021-01-31 DIAGNOSIS — C329 Malignant neoplasm of larynx, unspecified: Secondary | ICD-10-CM

## 2021-01-31 LAB — BASIC METABOLIC PANEL
BUN: 21 (ref 4–21)
CO2: 31 — AB (ref 13–22)
Chloride: 98 — AB (ref 99–108)
Creatinine: 1.6 — AB (ref 0.6–1.3)
Glucose: 181
Potassium: 4.3 (ref 3.4–5.3)
Sodium: 139 (ref 137–147)

## 2021-01-31 LAB — HEPATIC FUNCTION PANEL
ALT: 18 (ref 10–40)
AST: 23 (ref 14–40)
Alkaline Phosphatase: 102 (ref 25–125)
Bilirubin, Total: 0.7

## 2021-01-31 LAB — COMPREHENSIVE METABOLIC PANEL
Albumin: 4.1 (ref 3.5–5.0)
Calcium: 9.3 (ref 8.7–10.7)

## 2021-01-31 LAB — CBC AND DIFFERENTIAL
HCT: 40 — AB (ref 41–53)
Hemoglobin: 13.4 — AB (ref 13.5–17.5)
Neutrophils Absolute: 4.62
Platelets: 270 (ref 150–399)
WBC: 6.6

## 2021-01-31 LAB — CBC: RBC: 4.64 (ref 3.87–5.11)

## 2021-02-01 ENCOUNTER — Ambulatory Visit: Payer: Medicare HMO | Admitting: Oncology

## 2021-02-13 ENCOUNTER — Telehealth: Payer: Self-pay | Admitting: Oncology

## 2021-02-13 NOTE — Telephone Encounter (Signed)
Per 12/6 LOS, No Follow Up Needed

## 2021-02-23 ENCOUNTER — Inpatient Hospital Stay: Payer: Medicare HMO | Admitting: Genetic Counselor

## 2021-02-23 ENCOUNTER — Encounter: Payer: Self-pay | Admitting: Genetic Counselor

## 2021-02-23 ENCOUNTER — Telehealth: Payer: Self-pay | Admitting: Genetic Counselor

## 2021-02-23 NOTE — Telephone Encounter (Signed)
I contacted Mr. Bramer because he did not login to his genetic counseling MyChart video visit. Mr. Poehler stated that he is having trouble logging into his MyChart and does not have anyone who can help at this time. Therefore, we discussed scheduling an in-person visit. We also discussed his family history of cancer and reviewed the purpose of the genetic counseling appointment/genetic testing. Mr. Hott reports that the only cancer in his family consists of a maternal half sister with breast cancer in her 3s, one maternal aunt with colon cancer in her 41s, one maternal aunt with lung cancer in her 15s (she smoked), and one maternal uncle with prostate cancer in his 59s (it was not metastatic). After reviewing the family history, we discussed that he does not meet NCCN criteria for genetic testing and our suspicion is low for a hereditary cancer syndrome in the family. We still offered an in-person appointment because you do not need to meet NCCN criteria to proceed with genetic testing and his testing would be of no cost based on his current insurance. However, Mr. [place] would like to forgo genetic counseling/testing at this time. We will cancel his appointment and he does not want to reschedule.   Lucille Passy, MS, [facility] Genetic Counselor [email or web address] (P) [phone]

## 2021-02-23 NOTE — Progress Notes (Deleted)
REFERRING PROVIDER: Gatha Mayer, MD [place], [place] [address]  PRIMARY PROVIDER:  Secundino Ginger, PA-C  PRIMARY REASON FOR VISIT:  ***   HISTORY OF PRESENT ILLNESS:   Mr. Hannen, a 65 y.o. male, was seen for a [place] cancer genetics consultation at the request of Dr. Orlene Erm due to a personal and family history of cancer.  Mr. Episcopo presents to clinic today to discuss the possibility of a hereditary predisposition to cancer, to discuss genetic testing, and to further clarify his future cancer risks, as well as potential cancer risks for family members.   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 The treatment plan ***.    CANCER HISTORY:  Oncology History  Cancer of larynx ([place])  12/30/2020 Initial Diagnosis   Cancer of larynx ([place])   02/05/2021 Cancer Staging   Staging form: Larynx - Glottis, AJCC 8th Edition - Clinical: Stage IVA (cT4a, cN1, cM0) - Signed by Derwood Kaplan, MD on 02/05/2021 Histologic grade (G): GX Histologic grading system: 3 grade system Laterality: Left Tumor size (mm): 23 Lymph-vascular invasion (LVI): Presence of LVI unknown/indeterminate Diagnostic confirmation: Positive histology Specimen type: Endoscopy with Biopsy Staged by: Managing physician Presence of extranodal extension: Unknown ECOG performance status: Grade 1 Perineural invasion (PNI): Unknown Prognostic indicators: Negative for P16 Stage used in treatment planning: Yes National guidelines used in treatment planning: Yes Type of national guideline used in treatment planning: NCCN        Past Medical History:  Diagnosis Date   Anemia    Arthritis    CAD (coronary artery disease)    Carotid artery occlusion    COPD (chronic obstructive pulmonary disease) ([place])    Diabetes mellitus without complication ([place])    Dyspnea    Gout    Hyperlipidemia    Hypertension    OSA (obstructive sleep apnea)    does not  use CPAP   Persistent disorder of initiating or maintaining sleep    Presence of permanent cardiac pacemaker    Vitamin D deficiency     Past Surgical History:  Procedure Laterality Date   ABDOMINAL AORTOGRAM W/LOWER EXTREMITY N/A 11/16/2019   Procedure: ABDOMINAL AORTOGRAM W/LOWER EXTREMITY;  Surgeon: Waynetta Sandy, MD;  Location: [facility];  Service: Cardiovascular;  Laterality: N/A;  Bilateral    BACK SURGERY  1991   CARDIAC CATHETERIZATION Right 2013   CATARACT EXTRACTION Bilateral 1994   CORONARY ARTERY BYPASS GRAFT  2013   PACEMAKER INSERTION     TRANSCAROTID ARTERY REVASCULARIZATION (TCAR) Right 10/22/2018   TRANSCAROTID ARTERY REVASCULARIZATION REDO (Right Neck)   TRANSCAROTID ARTERY REVASCULARIZATION  Right 07/18/2017   Procedure: TRANSCAROTID ARTERY REVASCULARIZATION;  Surgeon: Waynetta Sandy, MD;  Location: [facility] OR;  Service: Vascular;  Laterality: Right;   TRANSCAROTID ARTERY REVASCULARIZATION  Right 10/22/2018   Procedure: TRANSCAROTID ARTERY REVASCULARIZATION REDO;  Surgeon: Serafina Mitchell, MD;  Location: [facility] OR;  Service: Vascular;  Laterality: Right;    Social History   Socioeconomic History   Marital status: Legally Separated    Spouse name: Not on file   Number of children: Not on file   Years of education: Not on file   Highest education level: Not on file  Occupational History   Not on file  Tobacco Use   Smoking status: Former    Packs/day: 0.25    Years: 40.00    Pack years: 10.00    Types: Cigarettes  Quit date: 12/20/2020    Years since quitting: 0.1   Smokeless tobacco: Never   Tobacco comments:    4 cigarettes a week  Vaping Use   Vaping Use: Former  Substance and Sexual Activity   Alcohol use: Yes    Alcohol/week: 1.0 standard drink    Types: 1 Cans of beer per week    Comment: 1 every 6 months or more   Drug use: No   Sexual activity: Not on file  Other Topics Concern   Not on file  Social History  Narrative   Not on file   Social Determinants of Health   Financial Resource Strain: Not on file  Food Insecurity: Not on file  Transportation Needs: Not on file  Physical Activity: Not on file  Stress: Not on file  Social Connections: Not on file     FAMILY HISTORY:  We obtained a detailed, 4-generation family history.  Significant diagnoses are listed below: ***  Mr. Eastridge is {aware/unaware} of previous family history of genetic testing for hereditary cancer risks. There {IS NO:12509} reported Ashkenazi Jewish ancestry.  GENETIC COUNSELING ASSESSMENT: Mr. Pustejovsky is a 65 y.o. male with a personal and family history of cancer which is somewhat suggestive of a hereditary predisposition to cancer given ***. We, therefore, discussed and recommended the following at today's visit.   DISCUSSION: We discussed that 5 - 10% of cancer is hereditary, with most cases of *** associated with ***.  There are other genes that can be associated with hereditary *** cancer syndromes.  We discussed that testing is beneficial for several reasons including knowing how to follow individuals after completing their treatment, identifying whether potential treatment options *** would be beneficial, and understanding if other family members could be at risk for cancer and allowing them to undergo genetic testing.   We reviewed the characteristics, features and inheritance patterns of hereditary cancer syndromes. We also discussed genetic testing, including the appropriate family members to test, the process of testing, insurance coverage and turn-around-time for results. We discussed the implications of a negative, positive, carrier and/or variant of uncertain significant result. We recommended Mr. Laurel pursue genetic testing for a panel that includes genes associated with *** cancer.   Mr. Husted  was offered a common hereditary cancer panel (47 genes) and an expanded pan-cancer panel (84 genes). Mr. Fakhouri was  informed of the benefits and limitations of each panel, including that expanded pan-cancer panels contain genes that do not have clear management guidelines at this point in time.  We also discussed that as the number of genes included on a panel increases, the chances of variants of uncertain significance increases.  After considering the benefits and limitations of each gene panel, Mr. Rama  elected to have *** through ***.  Based on Mr. Impastato's {Personal/family:20331} history of cancer, he meets medical criteria for genetic testing. Despite that he meets criteria, he may still have an out of pocket cost. We discussed that if his out of pocket cost for testing is over $100, the laboratory will call and confirm whether he wants to proceed with testing.  If the out of pocket cost of testing is less than $100 he will be billed by the genetic testing laboratory.   ***We reviewed the characteristics, features and inheritance patterns of hereditary cancer syndromes. We also discussed genetic testing, including the appropriate family members to test, the process of testing, insurance coverage and turn-around-time for results. We discussed the implications of a negative, positive  and/or variant of uncertain significant result. In order to get genetic test results in a timely manner so that Mr. Stroh can use these genetic test results for surgical decisions, we recommended Mr. Lourenco pursue genetic testing for the ***. Once complete, we recommend Mr. Yaworski pursue reflex genetic testing to the *** gene panel.   Based on Mr. Sthilaire's {Personal/family:20331} history of cancer, he meets medical criteria for genetic testing. Despite that he meets criteria, he may still have an out of pocket cost.   ***We discussed with Mr. Prevo that the {Personal/family:20331} history does not meet insurance or NCCN criteria for genetic testing and, therefore, is not highly consistent with a familial hereditary cancer syndrome.  We feel  he is at low risk to harbor a gene mutation associated with such a condition. Thus, we did not recommend any genetic testing, at this time, and recommended Mr. Pennebaker continue to follow the cancer screening guidelines given by his primary healthcare provider.  PLAN: After considering the risks, benefits, and limitations, Mr. Farver provided informed consent to pursue genetic testing and the blood sample was sent to {Lab} Laboratories for analysis of the {test}. Results should be available within approximately {TAT TIME} weeks' time, at which point they will be disclosed by telephone to Mr. Mulligan, as will any additional recommendations warranted by these results. Mr. Ahrendt will receive a summary of his genetic counseling visit and a copy of his results once available. This information will also be available in Epic.   *** Despite our recommendation, Mr. Heidinger did not wish to pursue genetic testing at today's visit. We understand this decision and remain available to coordinate genetic testing at any time in the future. We, therefore, recommend Mr. Espinoza continue to follow the cancer screening guidelines given by his primary healthcare provider.  ***Based on Mr. Meckes's family history, we recommended his ***, who was diagnosed with *** at age ***, have genetic counseling and testing. Mr. Weida will let us know if we can be of any assistance in coordinating genetic counseling and/or testing for this family member.   Lastly, we encouraged Mr. Sprowl to remain in contact with cancer genetics annually so that we can continuously update the family history and inform him of any changes in cancer genetics and testing that may be of benefit for this family.   Mr. Codispoti questions were answered to his satisfaction today. Our contact information was provided should additional questions or concerns arise. Thank you for the referral and allowing [place] to share in the care of your patient.   Lucille Passy, MS,  [facility] Genetic Counselor [email or web address] (P) [phone]  The patient was seen for a total of *** minutes in face-to-face genetic counseling.  ***The patient brought ***.  ***The patient was seen alone.  Drs. Lindi Adie and/or Burr Medico were available to discuss this case as needed.   _______________________________________________________________________ For Office Staff:  Number of people involved in session: *** Was an Intern/ student involved with case: no

## 2021-04-14 ENCOUNTER — Other Ambulatory Visit: Payer: Self-pay | Admitting: Radiation Oncology

## 2021-04-14 ENCOUNTER — Ambulatory Visit
Admission: RE | Admit: 2021-04-14 | Discharge: 2021-04-14 | Disposition: A | Discharge status: Home / Self Care | Payer: Self-pay | Source: Ambulatory Visit | Attending: Radiation Oncology | Admitting: Radiation Oncology

## 2021-04-14 DIAGNOSIS — C329 Malignant neoplasm of larynx, unspecified: Secondary | ICD-10-CM

## 2021-04-21 NOTE — Progress Notes (Signed)
Oncology Nurse Navigator Documentation   I attempted to call Mr. Demattia to introduce myself as his nurse navigator here at [facility] but left a voice mail due to no answer. I left my direct contact information to call and told him that I would meet him on 2/28 during his appointment with Dr. Isidore Moos.   Harlow Asa RN, BSN, OCN Head & Neck Oncology Nurse [place] at [facility] Phone # [phone]  Fax # [phone]

## 2021-04-24 NOTE — Progress Notes (Signed)
Head and Neck Cancer Location of Tumor / Histology:  Squamous cell carcinoma of the larynx  Patient presented symptoms of: had been having hoarseness over a year, with some left sided sore throat / otalgia and dysphagia   Biopsies revealed:  03/27/2021 Final Pathologic Diagnosis   A. LYMPH NODES, SUBMENTAL CONTENTS, REGIONAL RESECTION:              Ten lymph nodes, negative for carcinoma (0/10).  B. LYMPH NODES, LEFT LEVEL 2A, 3 AND 4, REGIONAL RESECTION:              One of twenty four lymph nodes involved by metastatic carcinoma (1/24).              Tumor deposit measures up to 1.8 cm in greatest dimension.               Negative for extranodal invasion.  C. LYMPH NODES, LEFT LEVEL 2B, REGIONAL RESECTION:              Three lymph nodes, negative for carcinoma (0/3).  D. LYMPH NODES, RIGHT 2A, 3 AND 4, REGIONAL RESECTION:              One of fifteen lymph nodes involved by metastatic carcinoma (1/15)              Largest tumor deposit measures 0.9 cm in greatest dimension.               Negative for extranodal extension.  E. LYMPH NODES, RIGHT LEVEL 2B, REGIONAL RESECTION:              Five lymph nodes, negative for metastatic carcinoma (0/5).    F. LARYNX, TOTAL LARYNGECTOMY:              Multifocal squamous cell carcinoma, moderately differentiated.               Largest tumor nodule (mass 1) measures 3.1 cm in greatest dimension. All margins of resection are uninvolved by carcinoma, 3 mm from the closest left arytenoid mucosal margin (Mass 1). Tumor invades through the cricoid cartilage and involves adjacent soft tissue. Mass 2 measures 1.5 cm in greatest dimension. Mass 2 is located anterior to the right side of the cricoid cartilage. Mass 2 is 0.5 cm from the tracheotomy site and 6 mm from the anterior soft tissue margin.  Uninvolved thyroid tissue.  mpT4a pN2c  12/28/2020 Final Pathologic Diagnosis   A.  VOCAL CORD, LEFT TRUE, BIOPSY:              INVASIVE SQUAMOUS CELL  CARCINOMA. B.  VOCAL FOLD, LEFT TRUE, BIOPSY:              INVASIVE SQUAMOUS CELL CARCINOMA.  Nutrition Status Yes No Comments  Weight changes? []  []    Swallowing concerns? [x]  []  Everything goes through PEG  PEG? [x]  []  Placed 12/28/2020   Referrals Yes No Comments  Social Work? [x]  []    Dentistry? []  [x]    Swallowing therapy? [x]  []    Nutrition? [x]  []    Med/Onc? []  [x]     Safety Issues Yes No Comments  Prior radiation? []  [x]    Pacemaker/ICD? [x]  []  Form sent on 04/25/2021  Possible current pregnancy? []  [x]  N/A  Is the patient on methotrexate? []  [x]     Tobacco/Marijuana/Snuff/ETOH use: Patient is a former smoker (quit in 11/2020; hx of ~1/4 pack/day for 40years). Used to occasionally drink alcohol. Denies any smokeless tobacco use or recreational drug use  Past/Anticipated  interventions by otolaryngology, if any:  04/18/2021 --Dr. Francina Ames (office visit) He has a postop fistula that is healing slowly but steadily.  Continue wet-to-dry , NPO status.  He will benefit from postop RT, He has an appt next week in [facility].  He doesn't have to be fully healed before starting but needs to be significantly improved from the current status.  I will see him back in 2 weeks , certainly sooner if there are problems or questions  He is agreeable with this plan. All his questions were answered  03/27/2021 --Dr. Francina Ames Procedure:  Direct Laryngoscopy and biopsy, with operating telescope Left selective neck dissection, levels 2 ,3,4 Right selective neck dissection, levels 2 ,3,4 Total laryngectomy Tracheoesophageal puncture with [employer] voice prosthesis placement Local tissue advancement: Bilateral sternocleidomastoid muscle advanced about 3 cm over carotid sheath vessels  12/28/2020 --Dr. Will Bonnet  Tracheotomy Direct Laryngoscopy with Microscope with Biopsy --Dr. Wendie Chess  LAPAROSCOPIC GGASTROSTOMY TUBE PLACEMENT  Past/Anticipated  interventions by medical oncology, if any:  No referral placed at this time   Current Complaints / other details:  Nothing else of note

## 2021-04-24 NOTE — Progress Notes (Signed)
Radiation Oncology         [phone] ________________________________  Initial Outpatient Consultation  Name: Dean Ellison MRN: [id number]  Date: 04/25/2021  DOB: 10/10/1955  EX:BMWUX, Otelia Limes, PA-C  Francina Ames, MD   REFERRING PHYSICIAN: Francina Ames, MD  DIAGNOSIS:    ICD-10-CM   1. 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 male who presented to Dr. Ilda Foil [facility] Otol[facility]ology), on 12/08/20 with complaints of hoarseness x 1 year, left sided sore throat, and dysphagia.   Subsequent neck/thyroid CT with contrast on 12/15/20 revealed a left laryngeal mass measuring 2.3 cm x 2.1 cm x 2.0 cm, with suspected infraglottic extension consistent with malignancy. Probable involvement was also seen of the left thyroid cartilage with suspected extralaryngeal extension. In addition, a single necrotic appearing left level III lymph node was appreciated; consistent with  malignant nodal spread of disease.   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 The patient also underwent tracheotomy on 12/28/20 and had a g-tube placed. No lymph nodes were examined.  Accordingly, the patient was referred to Dr. Nicolette Bang on 01/11/21 for further management. Following discussion of the risks and benefits, the patient agreed to proceed with the plan of total laryngectomy and bilateral neck dissection, followed by possible adjuvant RT. The patient also met with Dr. Hinton Rao on 01/31/21 who concurred with Dr. Servando Salina treatment plan. During his visit with Dr. Hinton Rao, the patient denied any pain or dysphagia, and  only reported ongoing issues with hoarseness.   Pre-op chest CT on 03/21/21 showed no evidence of metastatic disease and + for centrilobular emphysema.   The patient proceeded to undergo total laryngectomy and nodal resectioning on 03/27/21. Pathology from the procedure revealed multifocal squamous cell carcinoma, moderately differentiated; all margins negative; with the largest tumor nodule measuring 3.1 cm in greatest dimension. Tumor was noted to invade through the cricoid cartilage and involve the adjacent soft tissue. Tumor invades through the cricoid cartilage and involves adjacent soft tissue. Mass 2 measures 1.5 cm in greatest dimension. Nodal status of 2/42 lymph nodes positive for metastatic carcinoma (one right and one left regional lymph nodes).  Negative margins, no LVI, no PNI, no ENE  Per Dr. Nicolette Bang, this procedure was complicated by a fistula which is healing slowly but steadily - not yet ready for RT  Swallowing issues, if any: none currently (s/p tracheostomy and g-tube placement)   Weight Changes: On 01/31/21, the patient reported loosing around 9 pounds since 01/26/21. Otherwise, patient denies any recent significant changes in weight.  Pain status: none  Other symptoms: hoarseness/voice changes  Tobacco history, if any: 50 pack-years, quit smoking in fall of 2022  ETOH abuse, if any: denies current drinking or previous abuse  Prior cancers, if any: none  Other: patient has a pacemaker    Nutrition Status Yes No Comments  Weight changes? _0  _1    Swallowing concerns? _2  _3  Everything goes through PEG  PEG? _4  _5  Placed 12/28/2020   Referrals Yes No Comments  Social Work? _6  _7    Dentistry? _8  _9    Swallowing therapy? _10  _11   Nutrition? _0  _1    Med/Onc? _2  _3     Safety Issues Yes No Comments  Prior radiation? _4  _5    Pacemaker/ICD? _6  _7  Form sent on 04/25/2021  Possible current pregnancy? _8  _9  N/A  Is the patient on methotrexate? _10  _11        Past/Anticipated interventions by otolaryngology, if any:  04/18/2021 --Dr. Francina Ames (office visit) He has a postop fistula that is healing slowly but steadily.  Continue wet-to-dry , NPO status.  He will benefit from postop RT, He has an appt next week in [place].  He doesn't have to be fully healed before starting but needs to be significantly improved from the current status.  I will see him back in 2 weeks , certainly sooner if there are problems or questions  He is agreeable with this plan. All his questions were answered    PREVIOUS RADIATION THERAPY: No  PAST MEDICAL HISTORY:  has a past medical history of Anemia, Arthritis, CAD (coronary artery disease), Carotid artery occlusion, COPD (chronic obstructive pulmonary disease) ([place]), Diabetes mellitus without complication ([place]), Dyspnea, Gout, Hyperlipidemia, Hypertension, OSA (obstructive sleep apnea), Persistent disorder of initiating or maintaining sleep, Presence of permanent cardiac pacemaker, and Vitamin D deficiency.    PAST SURGICAL HISTORY: Past Surgical History:  Procedure Laterality Date   ABDOMINAL AORTOGRAM W/LOWER EXTREMITY N/A 11/16/2019   Procedure: ABDOMINAL AORTOGRAM W/LOWER EXTREMITY;  Surgeon: Waynetta Sandy, MD;  Location: [facility];  Service: Cardiovascular;  Laterality: N/A;  Bilateral    BACK SURGERY  1991   CARDIAC CATHETERIZATION Right 2013   CATARACT EXTRACTION Bilateral 1994   CORONARY ARTERY BYPASS GRAFT  2013   PACEMAKER INSERTION     TRANSCAROTID ARTERY REVASCULARIZATION (TCAR) Right 10/22/2018   TRANSCAROTID ARTERY REVASCULARIZATION REDO (Right Neck)   TRANSCAROTID ARTERY REVASCULARIZATION  Right 07/18/2017   Procedure: TRANSCAROTID ARTERY REVASCULARIZATION;  Surgeon: Waynetta Sandy, MD;  Location: [facility];  Service: Vascular;  Laterality: Right;   TRANSCAROTID ARTERY REVASCULARIZATION  Right 10/22/2018   Procedure: TRANSCAROTID ARTERY REVASCULARIZATION  REDO;  Surgeon: Serafina Mitchell, MD;  Location: [facility];  Service: Vascular;  Laterality: Right;    FAMILY HISTORY: family history includes Arthritis in his father and mother; Breast cancer (age of onset: 1) in his sister; Colon cancer in his maternal aunt; Diabetes in his father and mother; Heart disease in his father and mother; Hypertension in his father and mother; Lung cancer in his maternal aunt; Prostate cancer in his maternal uncle; Skin cancer in his brother.  SOCIAL HISTORY:  reports that he quit smoking about 4 months ago. His smoking use included cigarettes. He has a 10.00 pack-year smoking history. He has never used smokeless tobacco. He reports current alcohol use of about 1.0 standard drink per week. He reports that he does not use drugs.  ALLERGIES: Meperidine  MEDICATIONS:  Current Outpatient Medications  Medication Sig Dispense Refill   albuterol (VENTOLIN HFA) 108 (90 Base) MCG/ACT inhaler Inhale 1-2 puffs into the lungs every 6 (six) hours as needed for wheezing or shortness of breath. 1 g 0   amiodarone (PACERONE) 200 MG tablet Take 200 mg by mouth daily.     aspirin EC 81 MG tablet Take 81 mg by mouth daily.     atenolol (TENORMIN) 50 MG tablet Take 50 mg by mouth 2 (two) times daily.      atorvastatin (LIPITOR) 40 MG tablet Take 80 mg by mouth daily.     Blood Glucose Monitoring Suppl (TRUE METRIX  METER) w/Device KIT      Evolocumab (REPATHA SURECLICK) 119 MG/ML SOAJ Inject into the skin.     famotidine (PEPCID) 20 MG tablet Take 20 mg by mouth 2 (two) times daily.     fluticasone (FLONASE) 50 MCG/ACT nasal spray Place 2 sprays into both nostrils daily. 1 g 0   furosemide (LASIX) 20 MG tablet Take 20 mg by mouth daily.      glipiZIDE (GLUCOTROL) 5 MG tablet Take 5 mg by mouth daily.     Icosapent Ethyl (VASCEPA) 1 g CAPS Take 2 g by mouth 2 (two) times daily.     insulin aspart (NOVOLOG FLEXPEN) 100 UNIT/ML FlexPen Inject under skin 3 times a day for mealtime and  correction per MD instructions. Maximum of 20 units per day.     insulin glargine (LANTUS) 100 UNIT/ML Solostar Pen Inject into the skin.     isosorbide mononitrate (IMDUR) 30 MG 24 hr tablet Take 30 mg by mouth daily.     JARDIANCE 10 MG TABS tablet Take 10 mg by mouth daily.     latanoprost (XALATAN) 0.005 % ophthalmic solution Place 1 drop into both eyes at bedtime.  5   lidocaine (XYLOCAINE) 2 % solution Use as directed 15 mLs in the mouth or throat every 3 (three) hours as needed for mouth pain (swish and spit). 100 mL 0   loratadine-pseudoephedrine (CLARITIN-D 12 HOUR) 5-120 MG tablet Take 1 tablet by mouth 2 (two) times daily. 14 tablet 0   nitroGLYCERIN (NITROSTAT) 0.4 MG SL tablet Place 0.4 mg under the tongue every 5 (five) minutes as needed for chest pain.     omeprazole (PRILOSEC) 40 MG capsule Take 40 mg by mouth daily.     sitaGLIPtin (JANUVIA) 100 MG tablet Take 100 mg by mouth daily.     tiotropium (SPIRIVA) 18 MCG inhalation capsule Place 18 mcg into inhaler and inhale daily.      TRUE METRIX BLOOD GLUCOSE TEST test strip      TRUEplus Lancets 33G MISC      varenicline (CHANTIX) 1 MG tablet Take 1 mg by mouth 2 (two) times daily.     XARELTO 2.5 MG TABS tablet Take 2.5 mg by mouth 2 (two) times daily.      No current facility-administered medications for this encounter.    REVIEW OF SYSTEMS:  Notable for that above.   PHYSICAL EXAM:  height is _0  (1.778 m) and weight is 163 lb 12.8 oz (74.3 kg). His temperature is 97.8 F (36.6 C). His blood pressure is 111/74 and his pulse is 84. His respiration is 20 and oxygen saturation is 100%.   General: Alert and oriented, in no acute distress HEENT: no teeth. Head is normocephalic. Extraocular movements are intact. Oropharynx is notable for no lesions. Neck: Neck is notable for clean packing in fistula at midline above tracheostomy site, no palpable adenopathy. Heart: Regular in rate and rhythm with no murmurs, rubs, or  gallops. Chest: Clear to auscultation bilaterally, with no rhonchi, wheezes, or rales. Abdomen: +PEG tube, no sign of infection Extremities:no  edema. Lymphatics: see Neck Exam Skin: No concerning lesions. Musculoskeletal: symmetric strength and muscle tone throughout. Neurologic: Cranial nerves II through XII are grossly intact. No obvious focalities. Speech is fluent. Coordination is intact. Psychiatric: Judgment and insight are intact. Affect is appropriate.  ECOG = 1  0 - Asymptomatic (Fully active, able to carry on all predisease activities without restriction)  1 - Symptomatic but completely ambulatory (  Restricted in physically strenuous activity but ambulatory and able to carry out work of a light or sedentary nature. For example, light housework, office work)  2 - Symptomatic, <50% in bed during the day (Ambulatory and capable of all self care but unable to carry out any work activities. Up and about more than 50% of waking hours)  3 - Symptomatic, >50% in bed, but not bedbound (Capable of only limited self-care, confined to bed or chair 50% or more of waking hours)  4 - Bedbound (Completely disabled. Cannot carry on any self-care. Totally confined to bed or chair)  5 - Death   Eustace Pen MM, Creech RH, Tormey DC, et al. [phone]). "Toxicity and response criteria of the [facility]". [place] Oncol. 5 (6): 649-55   LABORATORY DATA:  Lab Results  Component Value Date   WBC 6.6 01/31/2021   HGB 13.4 (A) 01/31/2021   HCT 40 (A) 01/31/2021   MCV 91.1 06/19/2020   PLT 270 01/31/2021   CMP     Component Value Date/Time   NA 139 01/31/2021 0000   K 4.3 01/31/2021 0000   CL 98 (A) 01/31/2021 0000   CO2 31 (A) 01/31/2021 0000   GLUCOSE 326 (H) 06/19/2020 0933   BUN 21 01/31/2021 0000   CREATININE 1.6 (A) 01/31/2021 0000   CREATININE 1.45 (H) 06/19/2020 0933   CALCIUM 9.3 01/31/2021 0000   PROT 6.3 (L) 06/18/2020 1512   ALBUMIN 4.1 01/31/2021 0000    AST 23 01/31/2021 0000   ALT 18 01/31/2021 0000   ALKPHOS 102 01/31/2021 0000   BILITOT 0.7 06/18/2020 1512   GFRNONAA 54 (L) 06/19/2020 0933   GFRAA 54 (L) 10/23/2018 0305      No results found for: TSH  Normal TSH noted in [place] records in past year   RADIOGRAPHY:  as above, I reviewed his images personally     IMPRESSION/PLAN:  This is a delightful patient with 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 I recommend post operative (6 weeks) radiotherapy for this patient - IMRT to primary surgical bed and b/l neck.  We discussed the potential risks, benefits, and side effects of radiotherapy. We talked in detail about acute and late effects. We discussed that some of the most bothersome acute effects may be mucositis, dysgeusia, salivary changes, skin irritation, hair loss, dehydration, weight loss and fatigue. We talked about late effects which include but are not necessarily limited to dysphagia, hypothyroidism, nerve injury, vascular injury, spinal cord injury, xerostomia, trismus, neck edema, and potential injury to any of the tissues in the head and neck region. No guarantees of treatment were given. A consent form was signed and placed in the patient's medical record. The patient is enthusiastic about proceeding with treatment. I look forward to participating in the patient's care.    Simulation (treatment planning) will take place on 3/13 to allow more healing. RT will be Ellison until he is released by Dr. Nicolette Bang. I messaged him to coordinate care.  We also discussed that the treatment of head and neck cancer is a multidisciplinary process to maximize treatment outcomes and quality of life. For this reason the following referrals have been or will be made:   Nutritionist for nutrition support during and after treatment.   Speech language pathology for swallowing and/or speech therapy.   Social work for social support.    Physical therapy due to risk of lymphedema in neck and  deconditioning.   On date of service, in  total, I spent 65 minutes on this encounter. Patient was seen in person.  __________________________________________   Eppie Gibson, MD  This document serves as a record of services personally performed by Eppie Gibson, MD. It was created on her behalf by Roney Mans, a trained medical scribe. The creation of this record is based on the scribe's personal observations and the provider's statements to them. This document has been checked and approved by the attending provider.

## 2021-04-25 ENCOUNTER — Other Ambulatory Visit: Payer: Self-pay

## 2021-04-25 ENCOUNTER — Encounter: Payer: Self-pay | Admitting: Radiation Oncology

## 2021-04-25 ENCOUNTER — Telehealth: Payer: Self-pay

## 2021-04-25 ENCOUNTER — Ambulatory Visit
Admission: RE | Admit: 2021-04-25 | Discharge: 2021-04-25 | Disposition: A | Discharge status: Home / Self Care | Payer: Medicare Other | Source: Ambulatory Visit | Attending: Radiation Oncology | Admitting: Radiation Oncology

## 2021-04-25 VITALS — BP 111/74 | HR 84 | Temp 97.8°F | Resp 20 | Ht 70.0 in | Wt 163.8 lb

## 2021-04-25 DIAGNOSIS — Z803 Family history of malignant neoplasm of breast: Secondary | ICD-10-CM | POA: Insufficient documentation

## 2021-04-25 DIAGNOSIS — Z800 Family history of malignant neoplasm of digestive organs: Secondary | ICD-10-CM | POA: Diagnosis not present

## 2021-04-25 DIAGNOSIS — Z7984 Long term (current) use of oral hypoglycemic drugs: Secondary | ICD-10-CM | POA: Diagnosis not present

## 2021-04-25 DIAGNOSIS — Z87891 Personal history of nicotine dependence: Secondary | ICD-10-CM | POA: Diagnosis not present

## 2021-04-25 DIAGNOSIS — C320 Malignant neoplasm of glottis: Secondary | ICD-10-CM | POA: Diagnosis not present

## 2021-04-25 DIAGNOSIS — E559 Vitamin D deficiency, unspecified: Secondary | ICD-10-CM | POA: Insufficient documentation

## 2021-04-25 DIAGNOSIS — J449 Chronic obstructive pulmonary disease, unspecified: Secondary | ICD-10-CM | POA: Diagnosis not present

## 2021-04-25 DIAGNOSIS — J432 Centrilobular emphysema: Secondary | ICD-10-CM | POA: Insufficient documentation

## 2021-04-25 DIAGNOSIS — Z7901 Long term (current) use of anticoagulants: Secondary | ICD-10-CM | POA: Diagnosis not present

## 2021-04-25 DIAGNOSIS — D649 Anemia, unspecified: Secondary | ICD-10-CM | POA: Insufficient documentation

## 2021-04-25 DIAGNOSIS — Z794 Long term (current) use of insulin: Secondary | ICD-10-CM | POA: Insufficient documentation

## 2021-04-25 DIAGNOSIS — M129 Arthropathy, unspecified: Secondary | ICD-10-CM | POA: Insufficient documentation

## 2021-04-25 DIAGNOSIS — Z7982 Long term (current) use of aspirin: Secondary | ICD-10-CM | POA: Insufficient documentation

## 2021-04-25 DIAGNOSIS — E119 Type 2 diabetes mellitus without complications: Secondary | ICD-10-CM | POA: Diagnosis not present

## 2021-04-25 DIAGNOSIS — I2510 Atherosclerotic heart disease of native coronary artery without angina pectoris: Secondary | ICD-10-CM | POA: Diagnosis not present

## 2021-04-25 DIAGNOSIS — Z79899 Other long term (current) drug therapy: Secondary | ICD-10-CM | POA: Insufficient documentation

## 2021-04-25 DIAGNOSIS — E785 Hyperlipidemia, unspecified: Secondary | ICD-10-CM | POA: Insufficient documentation

## 2021-04-25 DIAGNOSIS — C329 Malignant neoplasm of larynx, unspecified: Secondary | ICD-10-CM

## 2021-04-25 NOTE — Telephone Encounter (Signed)
Faxed pacemaker clearance form to [facility] [phone]). Received confirmation notice that fax went through successfully. Called and spoke with staff member Glenard Haring [phone]) at [facility] who confirmed they had received form, and would fax back to our clinic once it had been completed by patient's cardiologist Dr. Verdell Face

## 2021-04-25 NOTE — Progress Notes (Signed)
Oncology Nurse Navigator Documentation   Met with patient during initial consult with Dr. Isidore Moos. He was accompanied by his daughter-in-law.  Further introduced myself as his/their Navigator, explained my role as a member of the Care Team. Provided New Patient Information packet: Contact information for physician, this navigator, other members of the Care Team Advance Directive information ([place] blue pamphlet with LCSW insert); provided [facility] AD booklet at his request,  Fall Prevention Patient [place] Information sheet Symptom Management Clinic information [facility] campus map with highlight of [place] SLP Information sheet Assisted with post-consult appt scheduling. I toured him to CT simulation and the [facility] treatment area, explained procedures for lobby registration, arrival to Radiation Waiting, and arrival to treatment area.  He voiced understanding.   They verbalized understanding of information provided. I encouraged them to call with questions/concerns moving forward.  Harlow Asa, RN, BSN, OCN Head & Neck Oncology Nurse [place] at [place] [phone]

## 2021-04-27 ENCOUNTER — Telehealth: Payer: Self-pay | Admitting: Nutrition

## 2021-04-27 NOTE — Telephone Encounter (Signed)
Scheduled appointment per inbasket message. Patient aware.   ?

## 2021-05-08 ENCOUNTER — Ambulatory Visit
Admission: RE | Admit: 2021-05-08 | Discharge: 2021-05-08 | Disposition: A | Discharge status: Home / Self Care | Payer: Medicare Other | Source: Ambulatory Visit | Attending: Radiation Oncology | Admitting: Radiation Oncology

## 2021-05-08 ENCOUNTER — Other Ambulatory Visit: Payer: Self-pay

## 2021-05-08 DIAGNOSIS — Z510 Encounter for antineoplastic radiation therapy: Secondary | ICD-10-CM | POA: Diagnosis present

## 2021-05-08 DIAGNOSIS — C320 Malignant neoplasm of glottis: Secondary | ICD-10-CM | POA: Insufficient documentation

## 2021-05-09 ENCOUNTER — Inpatient Hospital Stay: Payer: Medicare Other | Attending: Oncology | Admitting: Nutrition

## 2021-05-09 ENCOUNTER — Encounter: Payer: Self-pay | Admitting: Nutrition

## 2021-05-09 NOTE — Progress Notes (Signed)
Patient did not show up for nutrition appointment. 

## 2021-05-10 ENCOUNTER — Other Ambulatory Visit: Payer: Self-pay

## 2021-05-10 DIAGNOSIS — C329 Malignant neoplasm of larynx, unspecified: Secondary | ICD-10-CM

## 2021-05-11 ENCOUNTER — Telehealth: Payer: Self-pay | Admitting: Licensed Clinical Social Worker

## 2021-05-11 NOTE — Telephone Encounter (Signed)
[place] ?Clinical Social Work ? ?Clinical Social Work was referred by nurse navigator for assessment of psychosocial needs in new head & neck cancer patient.  Clinical Social Worker  attempted to contact pt by phone   to offer support and assess for needs.   ?No answer. Left VM with direct contact information. ? ? ? ? ? ?Dean Headen E Hershall Benkert, LCSW  ?Clinical Social Worker ?[place] ?      ? ?

## 2021-05-15 ENCOUNTER — Ambulatory Visit
Admission: RE | Admit: 2021-05-15 | Discharge: 2021-05-15 | Disposition: A | Discharge status: Home / Self Care | Payer: Medicare Other | Source: Ambulatory Visit | Attending: Radiation Oncology | Admitting: Radiation Oncology

## 2021-05-15 ENCOUNTER — Other Ambulatory Visit: Payer: Self-pay

## 2021-05-15 DIAGNOSIS — Z510 Encounter for antineoplastic radiation therapy: Secondary | ICD-10-CM | POA: Diagnosis not present

## 2021-05-16 ENCOUNTER — Ambulatory Visit
Admission: RE | Admit: 2021-05-16 | Discharge: 2021-05-16 | Disposition: A | Discharge status: Home / Self Care | Payer: Medicare Other | Source: Ambulatory Visit | Attending: Radiation Oncology | Admitting: Radiation Oncology

## 2021-05-16 DIAGNOSIS — Z510 Encounter for antineoplastic radiation therapy: Secondary | ICD-10-CM | POA: Diagnosis not present

## 2021-05-17 ENCOUNTER — Ambulatory Visit
Admission: RE | Admit: 2021-05-17 | Discharge: 2021-05-17 | Disposition: A | Discharge status: Home / Self Care | Payer: Medicare Other | Source: Ambulatory Visit | Attending: Radiation Oncology | Admitting: Radiation Oncology

## 2021-05-17 ENCOUNTER — Inpatient Hospital Stay: Payer: Medicare Other | Admitting: Dietician

## 2021-05-17 ENCOUNTER — Other Ambulatory Visit: Payer: Self-pay

## 2021-05-17 DIAGNOSIS — Z510 Encounter for antineoplastic radiation therapy: Secondary | ICD-10-CM | POA: Diagnosis not present

## 2021-05-17 DIAGNOSIS — C329 Malignant neoplasm of larynx, unspecified: Secondary | ICD-10-CM

## 2021-05-17 MED ORDER — SONAFINE EX EMUL
1.0000 "application " | Freq: Two times a day (BID) | CUTANEOUS | Status: DC
  Administered 2021-05-17: 1 via TOPICAL

## 2021-05-17 NOTE — Progress Notes (Signed)
Pt here for patient teaching.  Education material reviewed with patient and daughter-in-law ? ?Pt given Radiation and You booklet, Managing Acute Radiation Side Effects for Head and Neck Cancer handout, skin care instructions, and Sonafine.   ? ?Reviewed areas of pertinence such as fatigue, hair loss, mouth changes, skin changes, throat changes, earaches, and taste changes .  ? ?Pt able to give teach back of to pat skin, use unscented/gentle soap, and drink plenty of water,apply Sonafine bid, avoid applying anything to skin within 4 hours of treatment, and to use an electric razor if they must shave.  ? ?Pt gave physical cues of understanding of information given (since unable to verbalize due to laryngectomy) and will contact nursing with any questions or concerns.   ? ?[email or web address] ?  ? ? ? ? ? ? ?

## 2021-05-18 ENCOUNTER — Ambulatory Visit
Admission: RE | Admit: 2021-05-18 | Discharge: 2021-05-18 | Disposition: A | Discharge status: Home / Self Care | Payer: Medicare Other | Source: Ambulatory Visit | Attending: Radiation Oncology | Admitting: Radiation Oncology

## 2021-05-18 DIAGNOSIS — Z510 Encounter for antineoplastic radiation therapy: Secondary | ICD-10-CM | POA: Diagnosis not present

## 2021-05-19 ENCOUNTER — Ambulatory Visit
Admission: RE | Admit: 2021-05-19 | Discharge: 2021-05-19 | Disposition: A | Discharge status: Home / Self Care | Payer: Medicare Other | Source: Ambulatory Visit | Attending: Radiation Oncology | Admitting: Radiation Oncology

## 2021-05-19 ENCOUNTER — Other Ambulatory Visit: Payer: Self-pay

## 2021-05-19 DIAGNOSIS — Z510 Encounter for antineoplastic radiation therapy: Secondary | ICD-10-CM | POA: Diagnosis not present

## 2021-05-22 ENCOUNTER — Ambulatory Visit
Admission: RE | Admit: 2021-05-22 | Discharge: 2021-05-22 | Disposition: A | Discharge status: Home / Self Care | Payer: Medicare Other | Source: Ambulatory Visit | Attending: Radiation Oncology | Admitting: Radiation Oncology

## 2021-05-22 ENCOUNTER — Other Ambulatory Visit: Payer: Self-pay | Admitting: Radiation Oncology

## 2021-05-22 DIAGNOSIS — Z510 Encounter for antineoplastic radiation therapy: Secondary | ICD-10-CM | POA: Diagnosis not present

## 2021-05-22 DIAGNOSIS — C320 Malignant neoplasm of glottis: Secondary | ICD-10-CM

## 2021-05-22 MED ORDER — LIDOCAINE VISCOUS HCL 2 % MT SOLN: OROMUCOSAL | 3 refills | Status: AC

## 2021-05-22 NOTE — Therapy (Signed)
?OUTPATIENT PHYSICAL THERAPY HEAD AND NECK EVALUATION ? ? ?Patient Name: Dean Ellison ?MRN: [id number] ?DOB:Jul 09, 1955, 66 y.o., male ?Today's Date: 05/23/2021 ? ? PT End of Session - 05/23/21 2236   ? ? Visit Number 1   ? Number of Visits 9   ? Date for PT Re-Evaluation 06/20/21   ? Authorization Type Medicare/Medicaid   ? PT Start Time 8341   arrived late  ? PT Stop Time 1055   ? PT Time Calculation (min) 32 min   ? Activity Tolerance Patient tolerated treatment well   ? Behavior During Therapy [facility] for tasks assessed/performed   ? ?  ?  ? ?  ? ? ?Past Medical History:  ?Diagnosis Date  ? Anemia   ? Arthritis   ? CAD (coronary artery disease)   ? Carotid artery occlusion   ? COPD (chronic obstructive pulmonary disease) ([place])   ? Diabetes mellitus without complication ([place])   ? Dyspnea   ? Gout   ? Hyperlipidemia   ? Hypertension   ? OSA (obstructive sleep apnea)   ? does not use CPAP  ? Persistent disorder of initiating or maintaining sleep   ? Presence of permanent cardiac pacemaker   ? Vitamin D deficiency   ? ?Past Surgical History:  ?Procedure Laterality Date  ? ABDOMINAL AORTOGRAM W/LOWER EXTREMITY N/A 11/16/2019  ? Procedure: ABDOMINAL AORTOGRAM W/LOWER EXTREMITY;  Surgeon: Waynetta Sandy, MD;  Location: [place] [facility];  Service: Cardiovascular;  Laterality: N/A;  Bilateral ?  ? BACK SURGERY  1991  ? CARDIAC CATHETERIZATION Right 2013  ? CATARACT EXTRACTION Bilateral 1994  ? CORONARY ARTERY BYPASS GRAFT  2013  ? PACEMAKER INSERTION    ? TRANSCAROTID ARTERY REVASCULARIZATION (TCAR) Right 10/22/2018  ? TRANSCAROTID ARTERY REVASCULARIZATION REDO (Right Neck)  ? TRANSCAROTID ARTERY REVASCULARIZATION?  Right 07/18/2017  ? Procedure: TRANSCAROTID ARTERY REVASCULARIZATION;  Surgeon: Waynetta Sandy, MD;  Location: [place];  Service: Vascular;  Laterality: Right;  ? TRANSCAROTID ARTERY REVASCULARIZATION?  Right 10/22/2018  ? Procedure: TRANSCAROTID ARTERY REVASCULARIZATION REDO;  Surgeon:  Serafina Mitchell, MD;  Location: [facility];  Service: Vascular;  Laterality: Right;  ? ?Patient Active Problem List  ? Diagnosis Date Noted  ? Cancer of larynx ([place]) 12/30/2020  ?  Class: Diagnosis of  ? Multifocal pneumonia 06/18/2020  ? Asymptomatic stenosis of right carotid artery without infarction 10/22/2018  ? Presence of permanent cardiac pacemaker   ? Vitamin D deficiency   ? Persistent disorder of initiating or maintaining sleep   ? OSA (obstructive sleep apnea)   ? Hypertension   ? Hyperlipidemia   ? Gout   ? Diabetes mellitus without complication ([place])   ? COPD (chronic obstructive pulmonary disease) ([place])   ? Carotid artery occlusion   ? CAD (coronary artery disease)   ? Arthritis   ? Anemia   ? Carotid artery stenosis, asymptomatic, right 07/18/2017  ? ? ?PCP: Secundino Ginger, PA-C ? ?REFERRING PROVIDER: Eppie Gibson, MD ? ?REFERRING DIAG: C32.9 (ICD-10-CM) - Cancer of larynx ([place]) ? ?THERAPY DIAG:  ?Cancer of larynx ([place]) ? ?Lymphedema, not elsewhere classified ? ?Aftercare following surgery for neoplasm ? ?ONSET DATE: 03/27/21 ? ?SUBJECTIVE   (mouth reading only) ?SUBJECTIVE STATEMENT: ?They say I have some swelling under the neck ? ?PERTINENT HISTORY:  ?Total laryngectomy, bilateral neck dissection and TEP on 9/62/22 complicated by fistula. Has started radiation. Other hx: carotid stenosis s/p Rt stent, DM, CAD s/p CABG, pacemaker, COPD, kidney failure, CHF ?[place] for  soft diet ? ?PATIENT GOALS   to be educated about the signs and symptoms of lymphedema and learn post op HEP.  ? ?PAIN:  ?Are you having pain? No ? ? ?PRECAUTIONS: Pacemaker, carotid stenosis and stent Rt side - avoid bilateral carotid compression during MLD ? ?WEIGHT BEARING RESTRICTIONS No ? ?FALLS:  ?Has patient fallen in last 6 months? No ? ?LIVING ENVIRONMENT: ?Patient lives with:daughter ? ?OCCUPATION: retired ? ?LEISURE: not currently ? ?PRIOR LEVEL OF FUNCTION: Independent ? ? ?OBJECTIVE ? ?COGNITION: ?          Overall cognitive  status: Within functional limits for tasks assessed                 ? ?POSTURE:  ?Forward head and rounded shoulders posture ? ?Observations: Tracheostomy in place, pt has small "pin size" (per patient's daughter) fistula above trach that she has been dressing with gauze.  Reports minimal to no drainage lately.   ?  ? ?CERVICAL AROM: All with some "soreness" Lt >Rt with AROM ? ? degrees  ?Flexion 35  ?Extension 35 - some suboccipital pain  ?Right lateral flexion 15  ?Left lateral flexion 20  ?Right rotation 55  ?Left rotation 45 - left neck tightness  ?  (Blank rows=not tested) ? ? See Lymphedema/Oncology questionnaire for facial measurements  ? See Media for photos  ? ?HOME EXERCISE PROGRAM: ?Head and neck clinic home stretches sheet ? ?ASSESSMENT: ? ?CLINICAL IMPRESSION: ?Pt with new submental edema post neck dissection now starting radiation.  Edema is moderately pitting and located in the submental region.  Cervical AROM is limited with muscular type pain near the SCM insertion bilaterally.  Evaluation was performed without pt's daughter present until the last 5 minutes and pt is unable to speak so information was obtained by yes and no type questions.  Pt was given a chip pack with instruction on use as well as education on the POC.   ? ?Pt will benefit from skilled therapeutic intervention to improve on the following deficits: Decreased knowledge of precautions and postural dysfunction. Other deficits: decreased knowledge of use of DME, decreased ROM, and increased edema ? ?PT treatment/interventions: ADL/self-care home management, pt/family education, therapeutic exercise. Other interventions Therapeutic exercises, Patient/Family education, DME instructions, Manual lymph drainage, Taping, and Manual therapy ? ?REHAB POTENTIAL: Good ? ?CLINICAL DECISION MAKING: Stable/uncomplicated ? ?EVALUATION COMPLEXITY: Low ? ? ?GOALS: ?Goals reviewed with patient? YES ? ?LONG TERM GOALS: (STG=LTG) ? ? Name Target Date  Goal status  ?1 Patient will be able to verbalize understanding of a home exercise program for cervical range of motion, posture, and walking.   ?Baseline:  No knowledge 05/23/2021 Achieved at eval  ?2 Patient will be able to verbalize understanding of proper sitting and standing posture. ?Baseline:  No knowledge 05/23/2021 Achieved at eval  ?3 Patient will be ind with edema management throughout radiation with use of compression and self MLD Baseline:  No knowledge 06/22/21 NEW  ?4 Pt will decrease chin strap measurement to 24.3 or less to demonstrate decreased edema 06/22/21 New  ? ? ? ?PLAN: ?PT FREQUENCY/DURATION: 1-2x per week x 4 weeks with hold as needed for radiation ? ?PLAN FOR NEXT SESSION: How was chip pack? Start performance of self MLD with handout ? ? ? ? ? ?Stark Bray, PT ?05/23/2021, 10:39 PM ? ?                     ?Head and Neck Lymphedema: ? ?Lymphedema (  swelling) is very common among patients who have undergone head and neck surgery or radiation therapy. Up to 75% of patients will have some signs and symptoms of lymphedema after treatment. ? ?Chronic lymphedema can lead to worsening inflammation and permanent fibrosis (scarring) of the tissues - leaving them stiff in texture. ? ?The treatment method used for lymphedema is called complete decongestive therapy, or CDT. CDT is a series of techniques including (1) massage known as manual lymph drainage (MLD), (2) compression,  (3) exercises to improve the flow of lymph, and (4) skin care of the affected areas. CDT has been shown to have lasting effects on the severity of lymphedema at all stages and to improve overall quality of life among lymphedema sufferers. Much of CDT may be performed at home by the patient under the guidance of a lymphedema therapist.  60% of patients with head and neck lymphedema can expect have significant improvement after CDT.  The highest rates of success are seen among patients who consistently and properly use CDT at least  5x per week over a 3 month period. ? ?The goal is to figure out how often you need to use your compression and massage to keep your swelling controlled - each patient is different. ? ? ?To start:  ? ?Use your comp

## 2021-05-23 ENCOUNTER — Other Ambulatory Visit: Payer: Self-pay

## 2021-05-23 ENCOUNTER — Ambulatory Visit: Payer: Medicare Other | Attending: Radiation Oncology | Admitting: Rehabilitation

## 2021-05-23 ENCOUNTER — Encounter: Payer: Self-pay | Admitting: Rehabilitation

## 2021-05-23 ENCOUNTER — Ambulatory Visit
Admission: RE | Admit: 2021-05-23 | Discharge: 2021-05-23 | Disposition: A | Discharge status: Home / Self Care | Payer: Medicare Other | Source: Ambulatory Visit | Attending: Radiation Oncology | Admitting: Radiation Oncology

## 2021-05-23 ENCOUNTER — Inpatient Hospital Stay: Payer: Medicare Other | Admitting: Dietician

## 2021-05-23 DIAGNOSIS — Z483 Aftercare following surgery for neoplasm: Secondary | ICD-10-CM | POA: Diagnosis present

## 2021-05-23 DIAGNOSIS — Z510 Encounter for antineoplastic radiation therapy: Secondary | ICD-10-CM | POA: Diagnosis not present

## 2021-05-23 DIAGNOSIS — C329 Malignant neoplasm of larynx, unspecified: Secondary | ICD-10-CM | POA: Diagnosis present

## 2021-05-23 DIAGNOSIS — I890 Lymphedema, not elsewhere classified: Secondary | ICD-10-CM | POA: Insufficient documentation

## 2021-05-23 NOTE — Patient Instructions (Signed)
Head and Neck Lymphedema:  Lymphedema (swelling) is very common among patients who have undergone head and neck surgery or radiation therapy. Up to 75% of patients will have some signs and symptoms of lymphedema after treatment.  Chronic lymphedema can lead to worsening inflammation and permanent fibrosis (scarring) of the tissues - leaving them stiff in texture.  The treatment method used for lymphedema is called complete decongestive therapy, or CDT. CDT is a series of techniques including (1) massage known as manual lymph drainage (MLD), (2) compression,  (3) exercises to improve the flow of lymph, and (4) skin care of the affected areas. CDT has been shown to have lasting effects on the severity of lymphedema at all stages and to improve overall quality of life among lymphedema sufferers. Much of CDT may be performed at home by the patient under the guidance of a lymphedema therapist.  60% of patients with head and neck lymphedema can expect have significant improvement after CDT.  The highest rates of success are seen among patients who consistently and properly use CDT at least 5x per week over a 3 month period.  The goal is to figure out how often you need to use your compression and massage to keep your swelling controlled - each patient is different.   To start:   Use your compression at least 1 hour per day up to overnight   Perform self massage at least 1 time per day   Sleep in an elevated position to keep swelling from worsening over night.  Consider a wedge pillow to help.    

## 2021-05-24 ENCOUNTER — Ambulatory Visit
Admission: RE | Admit: 2021-05-24 | Discharge: 2021-05-24 | Disposition: A | Discharge status: Home / Self Care | Payer: Medicare Other | Source: Ambulatory Visit | Attending: Radiation Oncology | Admitting: Radiation Oncology

## 2021-05-24 DIAGNOSIS — Z510 Encounter for antineoplastic radiation therapy: Secondary | ICD-10-CM | POA: Diagnosis not present

## 2021-05-25 ENCOUNTER — Other Ambulatory Visit: Payer: Self-pay

## 2021-05-25 ENCOUNTER — Ambulatory Visit
Admission: RE | Admit: 2021-05-25 | Discharge: 2021-05-25 | Disposition: A | Discharge status: Home / Self Care | Payer: Medicare Other | Source: Ambulatory Visit | Attending: Radiation Oncology | Admitting: Radiation Oncology

## 2021-05-25 DIAGNOSIS — Z510 Encounter for antineoplastic radiation therapy: Secondary | ICD-10-CM | POA: Diagnosis not present

## 2021-05-26 ENCOUNTER — Ambulatory Visit
Admission: RE | Admit: 2021-05-26 | Discharge: 2021-05-26 | Disposition: A | Discharge status: Home / Self Care | Payer: Medicare Other | Source: Ambulatory Visit | Attending: Radiation Oncology | Admitting: Radiation Oncology

## 2021-05-26 DIAGNOSIS — Z510 Encounter for antineoplastic radiation therapy: Secondary | ICD-10-CM | POA: Diagnosis not present

## 2021-05-29 ENCOUNTER — Ambulatory Visit
Admission: RE | Admit: 2021-05-29 | Discharge: 2021-05-29 | Disposition: A | Discharge status: Home / Self Care | Payer: Medicare Other | Source: Ambulatory Visit | Attending: Radiation Oncology | Admitting: Radiation Oncology

## 2021-05-29 ENCOUNTER — Other Ambulatory Visit: Payer: Self-pay

## 2021-05-29 DIAGNOSIS — Z510 Encounter for antineoplastic radiation therapy: Secondary | ICD-10-CM | POA: Diagnosis present

## 2021-05-29 DIAGNOSIS — C320 Malignant neoplasm of glottis: Secondary | ICD-10-CM | POA: Insufficient documentation

## 2021-05-30 ENCOUNTER — Inpatient Hospital Stay: Payer: Medicare Other | Attending: Oncology | Admitting: Dietician

## 2021-05-30 ENCOUNTER — Ambulatory Visit
Admission: RE | Admit: 2021-05-30 | Discharge: 2021-05-30 | Disposition: A | Discharge status: Home / Self Care | Payer: Medicare Other | Source: Ambulatory Visit | Attending: Radiation Oncology | Admitting: Radiation Oncology

## 2021-05-30 DIAGNOSIS — Z510 Encounter for antineoplastic radiation therapy: Secondary | ICD-10-CM | POA: Diagnosis not present

## 2021-05-30 NOTE — Progress Notes (Signed)
Patient did not show for scheduled nutrition appointment.  

## 2021-05-31 ENCOUNTER — Ambulatory Visit
Admission: RE | Admit: 2021-05-31 | Discharge: 2021-05-31 | Disposition: A | Discharge status: Home / Self Care | Payer: Medicare Other | Source: Ambulatory Visit | Attending: Radiation Oncology | Admitting: Radiation Oncology

## 2021-05-31 ENCOUNTER — Other Ambulatory Visit: Payer: Self-pay

## 2021-05-31 DIAGNOSIS — Z510 Encounter for antineoplastic radiation therapy: Secondary | ICD-10-CM | POA: Diagnosis not present

## 2021-06-01 ENCOUNTER — Ambulatory Visit: Payer: Medicare Other | Attending: Radiation Oncology

## 2021-06-01 ENCOUNTER — Ambulatory Visit
Admission: RE | Admit: 2021-06-01 | Discharge: 2021-06-01 | Disposition: A | Discharge status: Home / Self Care | Payer: Medicare Other | Source: Ambulatory Visit | Attending: Radiation Oncology | Admitting: Radiation Oncology

## 2021-06-01 DIAGNOSIS — Z510 Encounter for antineoplastic radiation therapy: Secondary | ICD-10-CM | POA: Diagnosis not present

## 2021-06-01 DIAGNOSIS — C329 Malignant neoplasm of larynx, unspecified: Secondary | ICD-10-CM | POA: Insufficient documentation

## 2021-06-01 DIAGNOSIS — R1310 Dysphagia, unspecified: Secondary | ICD-10-CM

## 2021-06-01 DIAGNOSIS — Z483 Aftercare following surgery for neoplasm: Secondary | ICD-10-CM | POA: Insufficient documentation

## 2021-06-01 DIAGNOSIS — I890 Lymphedema, not elsewhere classified: Secondary | ICD-10-CM | POA: Diagnosis present

## 2021-06-01 DIAGNOSIS — R491 Aphonia: Secondary | ICD-10-CM | POA: Diagnosis present

## 2021-06-01 NOTE — Therapy (Signed)
[place] [facility] ?[address], [address] ?[place], [place], [address] ?Phone: [phone]   Fax:  [phone] ? ?Speech Language Pathology Evaluation ? ?Patient Details  ?Name: Dean Ellison ?MRN: [id number] ?Date of Birth: [birth year] ?Referring Provider (SLP): Eppie Gibson, MD ? ? ?Encounter Date: 06/01/2021 ? ? End of Session - 06/01/21 1307   ? ? Visit Number 1   ? Number of Visits 4   ? Date for SLP Re-Evaluation 08/30/21   ? SLP Start Time 2248   ? SLP Stop Time  1014   ? SLP Time Calculation (min) 39 min   ? Activity Tolerance Patient tolerated treatment well   ? ?  ?  ? ?  ? ? ?Past Medical History:  ?Diagnosis Date  ? Anemia   ? Arthritis   ? CAD (coronary artery disease)   ? Carotid artery occlusion   ? COPD (chronic obstructive pulmonary disease) ([place])   ? Diabetes mellitus without complication ([place])   ? Dyspnea   ? Gout   ? Hyperlipidemia   ? Hypertension   ? OSA (obstructive sleep apnea)   ? does not use CPAP  ? Persistent disorder of initiating or maintaining sleep   ? Presence of permanent cardiac pacemaker   ? Vitamin D deficiency   ? ? ?Past Surgical History:  ?Procedure Laterality Date  ? ABDOMINAL AORTOGRAM W/LOWER EXTREMITY N/A 11/16/2019  ? Procedure: ABDOMINAL AORTOGRAM W/LOWER EXTREMITY;  Surgeon: Waynetta Sandy, MD;  Location: [place] [facility];  Service: Cardiovascular;  Laterality: N/A;  Bilateral ?  ? BACK SURGERY  1991  ? CARDIAC CATHETERIZATION Right 2013  ? CATARACT EXTRACTION Bilateral 1994  ? CORONARY ARTERY BYPASS GRAFT  2013  ? PACEMAKER INSERTION    ? TRANSCAROTID ARTERY REVASCULARIZATION (TCAR) Right 10/22/2018  ? TRANSCAROTID ARTERY REVASCULARIZATION REDO (Right Neck)  ? TRANSCAROTID ARTERY REVASCULARIZATION?  Right 07/18/2017  ? Procedure: TRANSCAROTID ARTERY REVASCULARIZATION;  Surgeon: Waynetta Sandy, MD;  Location: [place];  Service: Vascular;  Laterality: Right;  ? TRANSCAROTID ARTERY REVASCULARIZATION?  Right 10/22/2018  ?  Procedure: TRANSCAROTID ARTERY REVASCULARIZATION REDO;  Surgeon: Serafina Mitchell, MD;  [facility];  Service: Vascular;  Laterality: Right;  ? ? ?There were no vitals filed for this visit. ? ? Subjective Assessment - 06/01/21 1304   ? ? Subjective Pt states he has difficulty in clearance of hamburger, specifically, through neopharynx. Pt eats other meats such as chicken, fish with WNL clearance.   ? Patient is accompained by: Family member   daughter  ? Currently in Pain? No/denies   ? ?  ?  ? ?  ? ? ? ? ? SLP Evaluation [facility] - 06/01/21 1304   ? ?  ? SLP Visit Information  ? SLP Received On 06/01/21   ? Referring Provider (SLP) Eppie Gibson, MD   ? Onset Date 2021 (hoarseness)   ? Medical Diagnosis SCC of larynx, S/P total laryngectomy   ?  ? Subjective  ? Patient/Family Stated Goal Swallow normally wihtout increased difficulty   ?  ? General Information  ? 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 He presented to [facility] on 12/08/20 with complaints of hoarseness for one year, left sided sore throat, and dysphagia.  12/15/20 CT neck revealed left laryngeal mass measuring 2.3 cm x 2.1 cm x 2.0 cm, with suspected infraglottic extension consistent with malignancy. Probable involvement was also seen of the  left thyroid cartilage with suspected extralaryngeal extension. In addition, a single necrotic appearing left level III lymph node was appreciated: consistent with malignant nodal spread of disease. 12/28/20 Biopsy of the left true vocal revealed invasive SCC. He underwent tracheostomy that day and had a G-tube placed. 01/11/21 seen by Dr. Nicolette Bang and total laryngectomy was recommended followed by possible adjuvant RT. A second opinion 01/31/21 also concurred with Dr. Servando Salina recommendations. 03/27/21 He had total laryngectomy and bilateral neck dissection with Dr. Nicolette Bang. Pathology revealed multifocal squamous cell carcinoma, moderately  differentiated; all margins negative; with the largest tumor nodule measuring 3.1 cm in greatest dimension. Tumor was noted to invade through the cricoid cartilage and involve the adjacent soft tissue. Mass 2 measured 1.5 cm in greatest dimension. Nodal status of 2/42 lymph nodes positive for metastatic carcinoma (one right and one left regional lymph nodes).  Negative margins, no LVI, no PNI, no ENE. Consult with Dr. Isidore Moos on 04/25/21. Fistula that developed after surgery was healed prior to starting CT simulation.  He will receive 30 fractions of radiation to his his primary surgical bed and bilateral neck.  He started on 05/15/21 and will complete 06/23/21 ?  ?  ? Balance Screen  ? Has the patient fallen in the past 6 months No   ?  ? Prior Functional Status  ? Cognitive/Linguistic Baseline Within functional limits   ?  ? Auditory Comprehension  ? Overall Auditory Comprehension Appears within functional limits for tasks assessed   ?  ? Verbal Expression  ? 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  ? Oral Motor/Sensory Function  ? Overall Oral Motor/Sensory Function Appears within functional limits for tasks assessed   ?  ? Motor Speech  ? Phonation Aphonic   ? Intelligibility Intelligibility reduced   ? Conversation 75-100% accurate   ? Effective Techniques Over-articulate;Pause;Slow rate   Pt increased his intelligibility for SLP when doing these compensations from 65% to 85%.  ? ?  ?  ? ?  ? ?Pt currently tolerates mostly regular diet with thin liquids (reports some difficulty with hamburger). POs: Pt ate [place] sandwich and drank water without overt difficulty with oral stage or with clearance through neopharynx. Pt's swallow deemed WFL at this time.  ? ?Because data states the risk for dysphagia during and after radiation treatment is high due to undergoing radiation tx, SLP taught pt about the possibility of reduced/limited  ability for PO intake during rad tx. Among other modifications for days when pt cannot functionally swallow, SLP talked about performing only non-swallowing tasks on the handout/HEP, and if necessary to cycle through the swallowing portion so the program of exercises can be completed instead of fatiguing on one of the swallowing exercises and not being able to perform the other swallowing exercises.  ? ?SLP educated pt re: changes to swallowing musculature after rad tx, and why adherence to dysphagia HEP provided today and PO consumption was necessary to inhibit muscle fibrosis following rad tx. Pt demonstrated understanding of these things to SLP.  ?  ?SLP then developed a HEP for pt and pt was instructed how to perform exercises involving lingual, and pharyngeal strengthening. SLP performed each exercise and pt return demonstrated each exercise. SLP ensured pt performance was correct prior to moving on to next exercise. Pt was instructed to complete this program 2 times a day, 6-7 days/week until 6 months after his last  rad tx, then x2 a week after that. ? ? ? ? ? ? ? ? ? ? ? ? ? ? ? ? ? SLP Education - 06/01/21 1306   ? ? Education Details HEP procedure, late effects head/neck radiation on swallow function, cycling through HEP with incr'd edema and difficulty swallowing near end of rad tx course   ? Person(s) Educated Patient;Child(ren)   ? Methods Explanation;Demonstration;Verbal cues;Handout   ? Comprehension Verbalized understanding;Returned demonstration;Verbal cues required;Need further instruction   ? ?  ?  ? ?  ? ?SLP Short Term Goals - 06/01/21  ?       ?SLP SHORT TERM GOAL #1    ?  Title ?Time pt will complete HEP with rare min A  ? 1  ?  Period --   sessions, for all STGs  ?  Status New  ?       ?  SLP SHORT TERM GOAL #2  ?  Title ?  ?Time ?Period pt will tell SLP why pt is completing HEP with modified independence x2 visits ?2     ?  Status New  ?       ?  SLP SHORT TERM GOAL #3  ?  Title ?   ?Time ?Period pt will describe 3 overt s/s aspiration PNA with modified independence  ?2  ?  Status New  ?       ?  SLP SHORT TERM GOAL #4  ?  Title ?  ?Time ?Period pt will tell SLP how a food journal could hasten return t

## 2021-06-01 NOTE — Patient Instructions (Signed)
SWALLOWING EXERCISES ?Do these until 6 months after your last day of radiation, then 2-3 times per week afterwards ? ?Effortful Swallows ?- Press your tongue against the roof of your mouth for 3 seconds, then squeeze the muscles in your neck while you swallow your saliva or a sip of water ?- Repeat 10-15 times, 2-3 times a day, and use whenever you eat or drink ? ?[place] Swallow - swallow with your tongue sticking out ?- Stick tongue out past your lips and gently bite tongue with your teeth ?- Swallow, while holding your tongue with your teeth ?- Repeat 10-15 times, 2-3 times a day ?*use a wet spoon if your mouth gets dry* ? ?Shaker Exercise - head lift ?- Lie flat on your back in your bed or on a couch without pillows ?- Raise your head and look at your feet - KEEP YOUR SHOULDERS DOWN ?- HOLD FOR 45-60 SECONDS, then lower your head back down ?- Repeat 3 times, 2-3 times a day ? ?Mendelsohn Maneuver - ?half swallow? exercise ?- Start to swallow, and keep your Adam?s apple up by squeezing hard with the muscles of the throat ?- Hold the squeeze for 5-7 seconds and then relax ?- Repeat 10-15 times, 2-3 times a day ?*use a wet spoon if your mouth gets dry* ? ?     10. "Super Swallow" ? - Take a breath and hold it ? - Swallow then IMMEDIATELY cough ? - Repeat 10 times, 2-3 times a day ? ?

## 2021-06-01 NOTE — Progress Notes (Signed)
Oncology Nurse Navigator Documentation  ? ?I met with Mr. Brummet and his daughter-in-law briefly before his SLP evaluation during head and neck clinic. He denies any questions or concerns at this time and knows to call me at anytime.  ? ?Harlow Asa RN, BSN, OCN ?Head & Neck Oncology Nurse Navigator ?[place] [facility] ?Phone # [phone]  ?Fax # [phone]  ?

## 2021-06-01 NOTE — Progress Notes (Signed)
Patient was monitored during treatment due to having a pace maker.Patient heart rate  was 84-85 bmp during treatment. :Patient did not show any s/s of stress during the treatment. ?

## 2021-06-02 ENCOUNTER — Other Ambulatory Visit: Payer: Self-pay

## 2021-06-02 ENCOUNTER — Ambulatory Visit
Admission: RE | Admit: 2021-06-02 | Discharge: 2021-06-02 | Disposition: A | Discharge status: Home / Self Care | Payer: Medicare Other | Source: Ambulatory Visit | Attending: Radiation Oncology | Admitting: Radiation Oncology

## 2021-06-02 DIAGNOSIS — Z510 Encounter for antineoplastic radiation therapy: Secondary | ICD-10-CM | POA: Diagnosis not present

## 2021-06-05 ENCOUNTER — Ambulatory Visit
Admission: RE | Admit: 2021-06-05 | Discharge: 2021-06-05 | Disposition: A | Discharge status: Home / Self Care | Payer: Medicare Other | Source: Ambulatory Visit | Attending: Radiation Oncology | Admitting: Radiation Oncology

## 2021-06-05 ENCOUNTER — Other Ambulatory Visit: Payer: Self-pay

## 2021-06-05 ENCOUNTER — Encounter: Payer: Medicare Other | Admitting: Nutrition

## 2021-06-05 DIAGNOSIS — Z510 Encounter for antineoplastic radiation therapy: Secondary | ICD-10-CM | POA: Diagnosis not present

## 2021-06-06 ENCOUNTER — Inpatient Hospital Stay: Payer: Medicare Other | Admitting: Dietician

## 2021-06-06 ENCOUNTER — Ambulatory Visit
Admission: RE | Admit: 2021-06-06 | Discharge: 2021-06-06 | Disposition: A | Discharge status: Home / Self Care | Payer: Medicare Other | Source: Ambulatory Visit | Attending: Radiation Oncology | Admitting: Radiation Oncology

## 2021-06-06 DIAGNOSIS — Z510 Encounter for antineoplastic radiation therapy: Secondary | ICD-10-CM | POA: Diagnosis not present

## 2021-06-06 NOTE — Progress Notes (Signed)
Patient did not show for nutrition appointment. 

## 2021-06-07 ENCOUNTER — Ambulatory Visit
Admission: RE | Admit: 2021-06-07 | Discharge: 2021-06-07 | Disposition: A | Discharge status: Home / Self Care | Payer: Medicare Other | Source: Ambulatory Visit | Attending: Radiation Oncology | Admitting: Radiation Oncology

## 2021-06-07 ENCOUNTER — Other Ambulatory Visit: Payer: Self-pay

## 2021-06-07 DIAGNOSIS — Z510 Encounter for antineoplastic radiation therapy: Secondary | ICD-10-CM | POA: Diagnosis not present

## 2021-06-08 ENCOUNTER — Ambulatory Visit
Admission: RE | Admit: 2021-06-08 | Discharge: 2021-06-08 | Disposition: A | Discharge status: Home / Self Care | Payer: Medicare Other | Source: Ambulatory Visit | Attending: Radiation Oncology | Admitting: Radiation Oncology

## 2021-06-08 DIAGNOSIS — Z510 Encounter for antineoplastic radiation therapy: Secondary | ICD-10-CM | POA: Diagnosis not present

## 2021-06-09 ENCOUNTER — Other Ambulatory Visit: Payer: Self-pay

## 2021-06-09 ENCOUNTER — Ambulatory Visit
Admission: RE | Admit: 2021-06-09 | Discharge: 2021-06-09 | Disposition: A | Discharge status: Home / Self Care | Payer: Medicare Other | Source: Ambulatory Visit | Attending: Radiation Oncology | Admitting: Radiation Oncology

## 2021-06-09 DIAGNOSIS — Z510 Encounter for antineoplastic radiation therapy: Secondary | ICD-10-CM | POA: Diagnosis not present

## 2021-06-12 ENCOUNTER — Other Ambulatory Visit: Payer: Self-pay

## 2021-06-12 ENCOUNTER — Ambulatory Visit
Admission: RE | Admit: 2021-06-12 | Discharge: 2021-06-12 | Disposition: A | Discharge status: Home / Self Care | Payer: Medicare Other | Source: Ambulatory Visit | Attending: Radiation Oncology | Admitting: Radiation Oncology

## 2021-06-12 DIAGNOSIS — Z510 Encounter for antineoplastic radiation therapy: Secondary | ICD-10-CM | POA: Diagnosis not present

## 2021-06-13 ENCOUNTER — Other Ambulatory Visit: Payer: Self-pay

## 2021-06-13 ENCOUNTER — Ambulatory Visit: Payer: Medicare Other

## 2021-06-13 ENCOUNTER — Inpatient Hospital Stay: Payer: Medicare Other | Admitting: Dietician

## 2021-06-13 ENCOUNTER — Ambulatory Visit
Admission: RE | Admit: 2021-06-13 | Discharge: 2021-06-13 | Disposition: A | Discharge status: Home / Self Care | Payer: Medicare Other | Source: Ambulatory Visit | Attending: Radiation Oncology | Admitting: Radiation Oncology

## 2021-06-13 DIAGNOSIS — Z510 Encounter for antineoplastic radiation therapy: Secondary | ICD-10-CM | POA: Diagnosis not present

## 2021-06-13 DIAGNOSIS — R1310 Dysphagia, unspecified: Secondary | ICD-10-CM | POA: Diagnosis not present

## 2021-06-13 DIAGNOSIS — C329 Malignant neoplasm of larynx, unspecified: Secondary | ICD-10-CM

## 2021-06-13 DIAGNOSIS — Z483 Aftercare following surgery for neoplasm: Secondary | ICD-10-CM

## 2021-06-13 DIAGNOSIS — I890 Lymphedema, not elsewhere classified: Secondary | ICD-10-CM

## 2021-06-13 LAB — RAD ONC ARIA SESSION SUMMARY
Course Elapsed Days: 29
Plan Fractions Treated to Date: 22
Plan Prescribed Dose Per Fraction: 2 Gy
Plan Total Fractions Prescribed: 30
Plan Total Prescribed Dose: 60 Gy
Reference Point Dosage Given to Date: 44 Gy
Reference Point Session Dosage Given: 2 Gy
Session Number: 22

## 2021-06-13 NOTE — Patient Instructions (Signed)
Manual Lymph Drainage for face/neck ? ?Place hands on either side of neck and do 15-20 stationary circles  ?Place hands on areas just above collar bones and do 15-20 stationary circles ?Do stationary circles on both shoulders (about 10 times) ?Do stationary circles at both armpit areas (about 10 times) ?Take 5 deep breaths ?Standing at the head of the bed, do stationary circles on each side of the neck just below the jaw line (15-20) ?Standing at the head of the bed, do downward circles just underneath the chin (15-20) ?Standing at the head of the bed, do stationary circles on each side of the face on the cheeks just above the jaw line (15-20) ?Standing at the head of the bed, do stationary downward circles on each side of the face between the eyes and ears (15-20) ?Repeat step 1, 2, 3, and 4 ? ?Cancer Rehab [phone] ? ? ? ?Do not slide on the skin ?Only give enough pressure to stretch the skin ?Make sure to always wash your hands prior to massage ?

## 2021-06-13 NOTE — Progress Notes (Signed)
Nutrition Assessment ? ? ?Reason for Assessment: Head & Neck ? ? ?ASSESSMENT: 66 year old male with laryngeal cancer. S/p PEG 111/2/22. He underwent total laryngectomy, bilateral neck dissection and TEP on 3/41/44 complicated by fistula. Patient is currently receiving radiation therapy. He is under the care of Dr. Isidore Moos. ? ?Past medical history includes COPD, DM, carotid stenosis s/p stent, CAD s/p CABG, atrial fibrillation s/p pacemaker, CHF, kidney failure, vit D deficiency, gout, OSA, HTN, HLD ? ?Met with patient in office. He is accompanied by his daughter and son today. Patient is unable to speak, he does not use EL much. Patient reports good appetite. He is eating "whatever he wants." Per daughter, they had not been coming to nutrition appointments because "some nurse said if he was eating good then we didn't need a dietitian." Patient reports ongoing dysphagia. He is taking small bites of food and chewing well. He is eating 3 good meals and drinking Ensure. Patient only likes chocolate flavor. Yesterday he had 2 scrambled eggs, grits, toast, 6 in sub, and 2 fish sandwiches. He is drinking ~2 liters of water. He denies dry mouth or thick saliva. He is flushing PEG tube, but does not do this everyday.  ? ?Nutrition Focused Physical Exam: deferred ? ? ?Medications: lidocaine, prilosec, lantus, glipizide, pacerone, lipitor, lasix, vascepa, imdur, xarelto, junuvia  ? ? ?Labs: no recent labs for review  ? ? ?Anthropometrics: Weights have decreased 7% (13 lbs) from usual weight in 4 months; not significant however concerning ? ?Height: 5'10" ?Weight: 168.8 lb (4/17) ?UBW: 181 lb (01/31/21) ?BMI: 24.2 ? ?4/4 - 175.4 lb (? Fluid) ?3/27 - 170.8 lb  ? ?Estimated Energy Needs ? ?Kcals: 3601-6580 ?Protein: 116-131 ?Fluid: ~2.3 L ? ? ?NUTRITION DIAGNOSIS: Food and nutrition related knowledge deficit related to laryngeal cancer as evidenced by no prior need for related nutrition information ? ? ?INTERVENTION:  ?Educated on  importance of adequate calorie and protein energy intake to maintain weight/strength ?Discussed soft moist high protein foods for ease of intake - handout provided ?Encouraged snacks in between meals and at bedtime ?Continue drinking Ensure Plus/equivalent, recommend 2/day - coupons provided ?Educated on importance of flushing tube everyday. Pt will flush with at least 30 ml of water/day ?Contact information provided  ? ? ?MONITORING, EVALUATION, GOAL: weight trends, intake ? ? ?Next Visit: Tuesday April 25 after radiation with Pamala Hurry (pt and family are aware) ? ? ? ? ? ? ?

## 2021-06-13 NOTE — Therapy (Signed)
?OUTPATIENT PHYSICAL THERAPY TREATMENT NOTE ? ? ?Patient Name: Dean Ellison ?MRN: [id number] ?DOB:06-Jul-1955, 66 y.o., male ?Today's Date: 06/13/2021 ? ?PCP: Secundino Ginger, PA-C ?REFERRING PROVIDER: Eppie Gibson, MD ? ?END OF SESSION:  ? PT End of Session - 06/13/21 1118   ? ? Visit Number 2   ? Number of Visits 9   ? Date for PT Re-Evaluation 06/20/21   ? Authorization Type Medicare/Medicaid   ? PT Start Time 1110   pt arrived late  ? PT Stop Time 1203   ? PT Time Calculation (min) 53 min   ? Activity Tolerance Patient tolerated treatment well   ? Behavior During Therapy [facility] for tasks assessed/performed   ? ?  ?  ? ?  ? ? ?Past Medical History:  ?Diagnosis Date  ? Anemia   ? Arthritis   ? CAD (coronary artery disease)   ? Carotid artery occlusion   ? COPD (chronic obstructive pulmonary disease) ([place])   ? Diabetes mellitus without complication ([place])   ? Dyspnea   ? Gout   ? Hyperlipidemia   ? Hypertension   ? OSA (obstructive sleep apnea)   ? does not use CPAP  ? Persistent disorder of initiating or maintaining sleep   ? Presence of permanent cardiac pacemaker   ? Vitamin D deficiency   ? ?Past Surgical History:  ?Procedure Laterality Date  ? ABDOMINAL AORTOGRAM W/LOWER EXTREMITY N/A 11/16/2019  ? Procedure: ABDOMINAL AORTOGRAM W/LOWER EXTREMITY;  Surgeon: Waynetta Sandy, MD;  Location: [place] [facility];  Service: Cardiovascular;  Laterality: N/A;  Bilateral ?  ? BACK SURGERY  1991  ? CARDIAC CATHETERIZATION Right 2013  ? CATARACT EXTRACTION Bilateral 1994  ? CORONARY ARTERY BYPASS GRAFT  2013  ? PACEMAKER INSERTION    ? TRANSCAROTID ARTERY REVASCULARIZATION (TCAR) Right 10/22/2018  ? TRANSCAROTID ARTERY REVASCULARIZATION REDO (Right Neck)  ? TRANSCAROTID ARTERY REVASCULARIZATION?  Right 07/18/2017  ? Procedure: TRANSCAROTID ARTERY REVASCULARIZATION;  Surgeon: Waynetta Sandy, MD;  Location: [place];  Service: Vascular;  Laterality: Right;  ? TRANSCAROTID ARTERY REVASCULARIZATION?  Right  10/22/2018  ? Procedure: TRANSCAROTID ARTERY REVASCULARIZATION REDO;  Surgeon: Serafina Mitchell, MD;  Location: [facility];  Service: Vascular;  Laterality: Right;  ? ?Patient Active Problem List  ? Diagnosis Date Noted  ? 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  Class: Diagnosis of  ? Multifocal pneumonia 06/18/2020  ? Asymptomatic stenosis of right carotid artery without infarction 10/22/2018  ? Presence of permanent cardiac pacemaker   ? Vitamin D deficiency   ? Persistent disorder of initiating or maintaining sleep   ? OSA (obstructive sleep apnea)   ? Hypertension   ? Hyperlipidemia   ? Gout   ? Diabetes mellitus without complication ([place])   ? COPD (chronic obstructive pulmonary disease) ([place])   ? Carotid artery occlusion   ? CAD (coronary artery disease)   ? Arthritis   ? Anemia   ? Carotid artery stenosis, asymptomatic, right 07/18/2017  ? ? 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 ?Lymphedema, not elsewhere classified ? ?Aftercare following surgery for neoplasm ? ?PERTINENT HISTORY: Total laryngectomy, bilateral neck dissection and TEP on 06/03/12 complicated by fistula. Has started radiation. Other hx: carotid stenosis s/p Rt stent, DM, CAD s/p CABG, pacemaker, COPD, kidney failure, CHF ?[place] for soft diet ? ?PRECAUTIONS: Pacemaker, carotid stenosis and stent Rt side - avoid bilateral carotid compression during MLD ? ?SUBJECTIVE:  The swelling is still there and feels a little firm. I've been wearing the chip pack 2-3 hrs/day. ? ?PAIN:  ?Are you having pain? No ? ? ? ? ?OBJECTIVE ?  ?COGNITION: ?          Overall cognitive status: Within functional limits for tasks assessed                 ?  ?POSTURE:  ?Forward head and rounded shoulders posture ?  ?Observations: Tracheostomy in place, pt has small "pin size" (per patient's daughter in law) fistula above trach that she has been dressing with gauze.  Reports minimal to no drainage lately.   ?             ?   ?CERVICAL AROM: All with some "soreness" Lt >Rt with AROM ?  ?  degrees  ?Flexion 35  ?Extension 35 - some suboccipital pain  ?Right lateral flexion 15  ?Left lateral flexion 20  ?Right rotation 55  ?Left rotation 45 - left neck tightness  ?                        (Blank rows=not tested) ?  ?           Todays Treatment  ? 06/13/21: ?Manual lymph drainage: In Supine with HOB elevated: Long neck sequence, 5 diaphragmatic breaths, bil shoulder collectors, bil axillary nodes, bil intact upper quadrant sequence, then anterior throat, submental and submandibular nodes, bil masseters, pre-and retroauricular nodes and suboccipital nodes redirecting towards bil axillary nodes and beginnning to instruct son and daughter in law while performing and then had her and pt return brief demonstration.  ?STM: Briefly to Lt SCM during MLD ?  ?PATIENT EDUCATION: ?Education details: Self MLD    ?Person educated: Patient and Child(ren) ?Education method: Explanation, Demonstration, Tactile cues, Verbal cues, and Handouts  ?Education comprehension: verbalized understanding, returned demonstration, verbal cues required, tactile cues required, and needs further education ? ?  ?ASSESSMENT: ?  ?CLINICAL IMPRESSION: ?Pt has been wearing chip pack daily and reports some softening of fibrosis afterwards. Today son and daughter in law were both present. Began first session of manual lymph drainage to head/neck area. Educated all involved in basics of anatomy of lymphatic system and basic principles of MLD. Then after performing entire sequence had pt and DIL return demo. VCs and hand over hand pressure was offered to each for correct pressure and to stretch, not slide, on skin. Handout was also issued of sequence, see below. They will both benefit from continued review and they were encouraged to try sequence once or twice before returning for next appt in 2 days. They verbalized understanding.  ?  ?Pt will benefit from skilled therapeutic  intervention to improve on the following deficits: Decreased knowledge of precautions and postural dysfunction. Other deficits: decreased knowledge of use of DME, decreased ROM, and increased edema ?  ?PT treatment/interventions: ADL/self-care home management, pt/family education, therapeutic exercise. Other interventions Therapeutic exercises, Patient/Family education, DME instructions, Manual lymph drainage, Taping, and Manual therapy ?  ?REHAB POTENTIAL: Good ?  ?CLINICAL DECISION MAKING: Stable/uncomplicated ?  ?EVALUATION COMPLEXITY: Low ?  ?  ?GOALS: ?Goals reviewed with patient? YES ?  ?LONG TERM GOALS: (STG=LTG) ?  ?  Name Target Date Goal status  ?1 Patient will be able to verbalize understanding of a home exercise program for cervical range of motion, posture, and walking.   ?Baseline:  No knowledge 05/23/2021 Achieved at eval  ?2  Patient will be able to verbalize understanding of proper sitting and standing posture. ?Baseline:  No knowledge 05/23/2021 Achieved at eval  ?3 Patient will be ind with edema management throughout radiation with use of compression and self MLD Baseline:  No knowledge 06/22/21 NEW  ?4 Pt will decrease chin strap measurement to 24.3 or less to demonstrate decreased edema 06/22/21 New  ?  ?  ?  ?PLAN: ?PT FREQUENCY/DURATION: 1-2x per week x 4 weeks with hold as needed for radiation ?  ?PLAN FOR NEXT SESSION: Cont (self) MLD and review technique with pt and daughter in law. ?  ? ? ? ?Otelia Limes, PTA ?06/13/2021, 4:26 PM ? ? ?Manual Lymph Drainage for face/neck ? ?Place hands on either side of neck and do 15-20 stationary circles  ?Place hands on areas just above collar bones and do 15-20 stationary circles ?Do stationary circles on both shoulders (about 10 times) ?Do stationary circles at both armpit areas (about 10 times) ?Take 5 deep breaths ?Standing at the head of the bed, do stationary circles on each side of the neck just below the jaw line (15-20) ?Standing at the  head of the bed, do downward circles just underneath the chin (15-20) ?Standing at the head of the bed, do stationary circles on each side of the face on the cheeks just above the jaw line (15-20) ?Standing at th

## 2021-06-14 ENCOUNTER — Other Ambulatory Visit: Payer: Self-pay

## 2021-06-14 ENCOUNTER — Ambulatory Visit
Admission: RE | Admit: 2021-06-14 | Discharge: 2021-06-14 | Disposition: A | Discharge status: Home / Self Care | Payer: Medicare Other | Source: Ambulatory Visit | Attending: Radiation Oncology | Admitting: Radiation Oncology

## 2021-06-14 DIAGNOSIS — Z510 Encounter for antineoplastic radiation therapy: Secondary | ICD-10-CM | POA: Diagnosis not present

## 2021-06-14 LAB — RAD ONC ARIA SESSION SUMMARY
Course Elapsed Days: 30
Plan Fractions Treated to Date: 23
Plan Prescribed Dose Per Fraction: 2 Gy
Plan Total Fractions Prescribed: 30
Plan Total Prescribed Dose: 60 Gy
Reference Point Dosage Given to Date: 46 Gy
Reference Point Session Dosage Given: 2 Gy
Session Number: 23

## 2021-06-15 ENCOUNTER — Ambulatory Visit: Payer: Medicare Other

## 2021-06-15 ENCOUNTER — Other Ambulatory Visit: Payer: Self-pay

## 2021-06-15 ENCOUNTER — Ambulatory Visit
Admission: RE | Admit: 2021-06-15 | Discharge: 2021-06-15 | Disposition: A | Discharge status: Home / Self Care | Payer: Medicare Other | Source: Ambulatory Visit | Attending: Radiation Oncology | Admitting: Radiation Oncology

## 2021-06-15 DIAGNOSIS — R1310 Dysphagia, unspecified: Secondary | ICD-10-CM | POA: Diagnosis not present

## 2021-06-15 DIAGNOSIS — Z510 Encounter for antineoplastic radiation therapy: Secondary | ICD-10-CM | POA: Diagnosis not present

## 2021-06-15 DIAGNOSIS — C329 Malignant neoplasm of larynx, unspecified: Secondary | ICD-10-CM

## 2021-06-15 DIAGNOSIS — I890 Lymphedema, not elsewhere classified: Secondary | ICD-10-CM

## 2021-06-15 DIAGNOSIS — Z483 Aftercare following surgery for neoplasm: Secondary | ICD-10-CM

## 2021-06-15 LAB — RAD ONC ARIA SESSION SUMMARY
Course Elapsed Days: 31
Plan Fractions Treated to Date: 24
Plan Prescribed Dose Per Fraction: 2 Gy
Plan Total Fractions Prescribed: 30
Plan Total Prescribed Dose: 60 Gy
Reference Point Dosage Given to Date: 48 Gy
Reference Point Session Dosage Given: 2 Gy
Session Number: 24

## 2021-06-15 NOTE — Therapy (Signed)
?OUTPATIENT PHYSICAL THERAPY TREATMENT NOTE ? ? ?Patient Name: Dean Ellison ?MRN: [id number] ?DOB:26-Jun-1955, 66 y.o., male ?Today's Date: 06/15/2021 ? ?PCP: Secundino Ginger, PA-C ?REFERRING PROVIDER: Eppie Gibson, MD ? ?END OF SESSION:  ? PT End of Session - 06/15/21 1022   ? ? Visit Number 3   ? Number of Visits 9   ? Date for PT Re-Evaluation 06/20/21   ? Authorization Type Medicare/Medicaid   ? PT Start Time 1019   pt arrived late due to road construction  ? PT Stop Time 1107   ? PT Time Calculation (min) 48 min   ? Activity Tolerance Patient tolerated treatment well   ? Behavior During Therapy [facility] for tasks assessed/performed   ? ?  ?  ? ?  ? ? ?Past Medical History:  ?Diagnosis Date  ? Anemia   ? Arthritis   ? CAD (coronary artery disease)   ? Carotid artery occlusion   ? COPD (chronic obstructive pulmonary disease) ([place])   ? Diabetes mellitus without complication ([place])   ? Dyspnea   ? Gout   ? Hyperlipidemia   ? Hypertension   ? OSA (obstructive sleep apnea)   ? does not use CPAP  ? Persistent disorder of initiating or maintaining sleep   ? Presence of permanent cardiac pacemaker   ? Vitamin D deficiency   ? ?Past Surgical History:  ?Procedure Laterality Date  ? ABDOMINAL AORTOGRAM W/LOWER EXTREMITY N/A 11/16/2019  ? Procedure: ABDOMINAL AORTOGRAM W/LOWER EXTREMITY;  Surgeon: Waynetta Sandy, MD;  Location: [place] [facility];  Service: Cardiovascular;  Laterality: N/A;  Bilateral ?  ? BACK SURGERY  1991  ? CARDIAC CATHETERIZATION Right 2013  ? CATARACT EXTRACTION Bilateral 1994  ? CORONARY ARTERY BYPASS GRAFT  2013  ? PACEMAKER INSERTION    ? TRANSCAROTID ARTERY REVASCULARIZATION (TCAR) Right 10/22/2018  ? TRANSCAROTID ARTERY REVASCULARIZATION REDO (Right Neck)  ? TRANSCAROTID ARTERY REVASCULARIZATION?  Right 07/18/2017  ? Procedure: TRANSCAROTID ARTERY REVASCULARIZATION;  Surgeon: Waynetta Sandy, MD;  Location: [place];  Service: Vascular;  Laterality: Right;  ? TRANSCAROTID ARTERY  REVASCULARIZATION?  Right 10/22/2018  ? Procedure: TRANSCAROTID ARTERY REVASCULARIZATION REDO;  Surgeon: Serafina Mitchell, MD;  Location: [facility];  Service: Vascular;  Laterality: Right;  ? ?Patient Active Problem List  ? Diagnosis Date Noted  ? 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  Class: Diagnosis of  ? Multifocal pneumonia 06/18/2020  ? Asymptomatic stenosis of right carotid artery without infarction 10/22/2018  ? Presence of permanent cardiac pacemaker   ? Vitamin D deficiency   ? Persistent disorder of initiating or maintaining sleep   ? OSA (obstructive sleep apnea)   ? Hypertension   ? Hyperlipidemia   ? Gout   ? Diabetes mellitus without complication ([place])   ? COPD (chronic obstructive pulmonary disease) ([place])   ? Carotid artery occlusion   ? CAD (coronary artery disease)   ? Arthritis   ? Anemia   ? Carotid artery stenosis, asymptomatic, right 07/18/2017  ? ? 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 ?Lymphedema, not elsewhere classified ? ?Aftercare following surgery for neoplasm ? ?PERTINENT HISTORY: Total laryngectomy, bilateral neck dissection and TEP on 3/82/50 complicated by fistula. Has started radiation. Other hx: carotid stenosis s/p Rt stent, DM, CAD s/p CABG, pacemaker, COPD, kidney failure, CHF ?[place] for soft diet ? ?PRECAUTIONS: Pacemaker, carotid stenosis and stent Rt side - avoid bilateral carotid compression  during MLD ? ?SUBJECTIVE: I've done the self MLD some since the other day and it went okay.  ? ?PAIN:  ?Are you having pain? No ? ? ? ? ?OBJECTIVE ?  ?COGNITION: ?          Overall cognitive status: Within functional limits for tasks assessed                 ?  ?POSTURE:  ?Forward head and rounded shoulders posture ?  ?Observations: Tracheostomy in place, pt has small "pin size" (per patient's daughter in law) fistula above trach that she has been dressing with gauze.  Reports minimal to no drainage lately.   ?             ?   ?CERVICAL AROM: All with some "soreness" Lt >Rt with AROM ?  ?  degrees  ?Flexion 35  ?Extension 35 - some suboccipital pain  ?Right lateral flexion 15  ?Left lateral flexion 20  ?Right rotation 55  ?Left rotation 45 - left neck tightness  ?                        (Blank rows=not tested) ?  ?           Todays Treatment  ? 06/15/21: ?Manual lymph drainage: In Supine with HOB elevated: Long neck sequence, 5 diaphragmatic breaths, bil shoulder collectors, bil axillary nodes, bil intact upper quadrant sequence, then anterior throat, submental and submandibular nodes, bil masseters, pre-and retroauricular nodes and suboccipital nodes redirecting towards bil axillary nodes reviewing light pressure with pt while performing and correct skin stretch, not slide.  ?STM: Gently to bil SCM during MLD ?P/ROM: During MLD into cervical rotation gently to pts tolerance and being mindful of trach ? ? 06/13/21: ?Manual lymph drainage: In Supine with HOB elevated: Long neck sequence, 5 diaphragmatic breaths, bil shoulder collectors, bil axillary nodes, bil intact upper quadrant sequence, then anterior throat, submental and submandibular nodes, bil masseters, pre-and retroauricular nodes and suboccipital nodes redirecting towards bil axillary nodes and beginnning to instruct son and daughter in law while performing and then had her and pt return brief demonstration.  ?STM: Briefly to Lt SCM during MLD ?  ?PATIENT EDUCATION: ?06/13/21:Education details: Self MLD    ?Person educated: Patient and Child(ren) ?Education method: Explanation, Demonstration, Tactile cues, Verbal cues, and Handouts  ?Education comprehension: verbalized understanding, returned demonstration, verbal cues required, tactile cues required, and needs further education ? ?  ?ASSESSMENT: ?  ?CLINICAL IMPRESSION: ?Pt returns to clinic reporting his throat feeling some softer since last session. He reports having tried the self MLD some and his anterior throat was palpably  mildly softer today. Continued with MLD to head/neck area reviewing with pt. Encouraged him to cont trying this over the weekend and we will cont review and have him perform to assess technique next week.  ?  ?Pt will benefit from skilled therapeutic intervention to improve on the following deficits: Decreased knowledge of precautions and postural dysfunction. Other deficits: decreased knowledge of use of DME, decreased ROM, and increased edema ?  ?PT treatment/interventions: ADL/self-care home management, pt/family education, therapeutic exercise. Other interventions Therapeutic exercises, Patient/Family education, DME instructions, Manual lymph drainage, Taping, and Manual therapy ?  ?REHAB POTENTIAL: Good ?  ?CLINICAL DECISION MAKING: Stable/uncomplicated ?  ?EVALUATION COMPLEXITY: Low ?  ?  ?GOALS: ?Goals reviewed with patient? YES ?  ?LONG TERM GOALS: (STG=LTG) ?  ?  Name Target Date Goal status  ?1  Patient will be able to verbalize understanding of a home exercise program for cervical range of motion, posture, and walking.   ?Baseline:  No knowledge 05/23/2021 Achieved at eval  ?2 Patient will be able to verbalize understanding of proper sitting and standing posture. ?Baseline:  No knowledge 05/23/2021 Achieved at eval  ?3 Patient will be ind with edema management throughout radiation with use of compression and self MLD Baseline:  No knowledge 06/22/21 NEW  ?4 Pt will decrease chin strap measurement to 24.3 or less to demonstrate decreased edema 06/22/21 New  ?  ?  ?  ?PLAN: ?PT FREQUENCY/DURATION: 1-2x per week x 4 weeks with hold as needed for radiation ?  ?PLAN FOR NEXT SESSION: Cont (self) MLD and review technique with pt and daughter in law (when she can come). ?  ? ? ? ?Otelia Limes, PTA ?06/15/2021, 11:26 AM ? ?          06/13/21 ?Manual Lymph Drainage for face/neck ? ?Place hands on either side of neck and do 15-20 stationary circles  ?Place hands on areas just above collar bones and do 15-20  stationary circles ?Do stationary circles on both shoulders (about 10 times) ?Do stationary circles at both armpit areas (about 10 times) ?Take 5 deep breaths ?Standing at the head of the bed, do stationary circl

## 2021-06-16 ENCOUNTER — Other Ambulatory Visit: Payer: Self-pay

## 2021-06-16 ENCOUNTER — Ambulatory Visit
Admission: RE | Admit: 2021-06-16 | Discharge: 2021-06-16 | Disposition: A | Discharge status: Home / Self Care | Payer: Medicare Other | Source: Ambulatory Visit | Attending: Radiation Oncology | Admitting: Radiation Oncology

## 2021-06-16 DIAGNOSIS — Z510 Encounter for antineoplastic radiation therapy: Secondary | ICD-10-CM | POA: Diagnosis not present

## 2021-06-16 LAB — RAD ONC ARIA SESSION SUMMARY
Course Elapsed Days: 32
Plan Fractions Treated to Date: 25
Plan Prescribed Dose Per Fraction: 2 Gy
Plan Total Fractions Prescribed: 30
Plan Total Prescribed Dose: 60 Gy
Reference Point Dosage Given to Date: 50 Gy
Reference Point Session Dosage Given: 2 Gy
Session Number: 25

## 2021-06-19 ENCOUNTER — Other Ambulatory Visit: Payer: Self-pay

## 2021-06-19 ENCOUNTER — Encounter: Payer: Medicare Other | Admitting: Nutrition

## 2021-06-19 ENCOUNTER — Ambulatory Visit
Admission: RE | Admit: 2021-06-19 | Discharge: 2021-06-19 | Disposition: A | Discharge status: Home / Self Care | Payer: Medicare Other | Source: Ambulatory Visit | Attending: Radiation Oncology | Admitting: Radiation Oncology

## 2021-06-19 DIAGNOSIS — Z510 Encounter for antineoplastic radiation therapy: Secondary | ICD-10-CM | POA: Diagnosis not present

## 2021-06-19 LAB — RAD ONC ARIA SESSION SUMMARY
Course Elapsed Days: 35
Plan Fractions Treated to Date: 26
Plan Prescribed Dose Per Fraction: 2 Gy
Plan Total Fractions Prescribed: 30
Plan Total Prescribed Dose: 60 Gy
Reference Point Dosage Given to Date: 52 Gy
Reference Point Session Dosage Given: 2 Gy
Session Number: 26

## 2021-06-20 ENCOUNTER — Other Ambulatory Visit: Payer: Self-pay

## 2021-06-20 ENCOUNTER — Inpatient Hospital Stay: Payer: Medicare Other | Admitting: Nutrition

## 2021-06-20 ENCOUNTER — Ambulatory Visit
Admission: RE | Admit: 2021-06-20 | Discharge: 2021-06-20 | Disposition: A | Discharge status: Home / Self Care | Payer: Medicare Other | Source: Ambulatory Visit | Attending: Radiation Oncology | Admitting: Radiation Oncology

## 2021-06-20 DIAGNOSIS — Z510 Encounter for antineoplastic radiation therapy: Secondary | ICD-10-CM | POA: Diagnosis not present

## 2021-06-20 LAB — RAD ONC ARIA SESSION SUMMARY
Course Elapsed Days: 36
Plan Fractions Treated to Date: 27
Plan Prescribed Dose Per Fraction: 2 Gy
Plan Total Fractions Prescribed: 30
Plan Total Prescribed Dose: 60 Gy
Reference Point Dosage Given to Date: 54 Gy
Reference Point Session Dosage Given: 2 Gy
Session Number: 27

## 2021-06-20 NOTE — Progress Notes (Signed)
Oncology Nurse Navigator Documentation  ? ?At Dr. Pearlie Oyster request I have contacted Dr. Servando Salina office to get Mr. Ransdell scheduled with him within the next 2 weeks for evaluation after completing radiation and to look at an area left neck that is moist and raised. Dr. Isidore Moos does not believe this area is related to radiation irritation. He has been scheduled for 5/10 at 1:45 and his daughter-in-law has been made aware of the appointment.  ? ?Harlow Asa RN, BSN, OCN ?Head & Neck Oncology Nurse Navigator ?[place] [facility] ?Phone # [phone]  ?Fax # [phone]  ?

## 2021-06-20 NOTE — Progress Notes (Signed)
Nutrition follow-up completed with patient and daughter. ?Patient receiving radiation therapy for laryngeal cancer.   ?Final radiation treatment is scheduled for Friday, April 28. ? ?Weight improved and documented as 171.4 pounds on April 24.  This is increased from 168.8 pounds April 17. ? ?Patient reports he continues to tolerate oral intake and is consuming soft textures and liquids without difficulty.  Patient reports he likes chocolate Ensure.  He/daughter is cleaning and flushing his feeding tube daily.  Upon examination of feeding tube, noted some discharge on the outside of the tube.  Skin underneath bumper looks a little red.  Daughter reports she is cleaning this with alcohol wipes because that is what she was told.  They use bottled water to flush feeding tube. ? ?Nutrition diagnosis: Food and nutrition related knowledge deficit continues. ? ?Intervention: ?Continue high-calorie, high-protein foods in small amounts throughout the day.  Chew food well and add liquids to ease swallowing if needed. ?Continue Ensure Plus or equivalent by mouth.  Provided samples and coupons for patient to purchase. ?Instructed daughter to use warm water to clean around PEG and a Q-tip to remove debris on the outside of the feeding tube.  Educated alcohol wipes might irritate skin. ?Continue flushing feeding tube with bottled water as directed. ? ?Monitoring, evaluation, goals: ?Patient will continue to tolerate adequate calories and protein to minimize weight loss. ? ?Next visit: Patient will contact RD for further questions or concerns. ? ?**Disclaimer: This note was dictated with voice recognition software. Similar sounding words can inadvertently be transcribed and this note may contain transcription errors which may not have been corrected upon publication of note.** ? ?

## 2021-06-21 ENCOUNTER — Other Ambulatory Visit: Payer: Self-pay

## 2021-06-21 ENCOUNTER — Ambulatory Visit
Admission: RE | Admit: 2021-06-21 | Discharge: 2021-06-21 | Disposition: A | Discharge status: Home / Self Care | Payer: Medicare Other | Source: Ambulatory Visit | Attending: Radiation Oncology | Admitting: Radiation Oncology

## 2021-06-21 DIAGNOSIS — Z510 Encounter for antineoplastic radiation therapy: Secondary | ICD-10-CM | POA: Diagnosis not present

## 2021-06-21 LAB — RAD ONC ARIA SESSION SUMMARY
Course Elapsed Days: 37
Plan Fractions Treated to Date: 28
Plan Prescribed Dose Per Fraction: 2 Gy
Plan Total Fractions Prescribed: 30
Plan Total Prescribed Dose: 60 Gy
Reference Point Dosage Given to Date: 56 Gy
Reference Point Session Dosage Given: 2 Gy
Session Number: 28

## 2021-06-22 ENCOUNTER — Ambulatory Visit: Payer: Medicare Other | Admitting: Physical Therapy

## 2021-06-22 ENCOUNTER — Other Ambulatory Visit: Payer: Self-pay

## 2021-06-22 ENCOUNTER — Encounter: Payer: Self-pay | Admitting: Physical Therapy

## 2021-06-22 ENCOUNTER — Ambulatory Visit
Admission: RE | Admit: 2021-06-22 | Discharge: 2021-06-22 | Disposition: A | Discharge status: Home / Self Care | Payer: Medicare Other | Source: Ambulatory Visit | Attending: Radiation Oncology | Admitting: Radiation Oncology

## 2021-06-22 DIAGNOSIS — C329 Malignant neoplasm of larynx, unspecified: Secondary | ICD-10-CM

## 2021-06-22 DIAGNOSIS — I890 Lymphedema, not elsewhere classified: Secondary | ICD-10-CM

## 2021-06-22 DIAGNOSIS — Z483 Aftercare following surgery for neoplasm: Secondary | ICD-10-CM

## 2021-06-22 DIAGNOSIS — Z510 Encounter for antineoplastic radiation therapy: Secondary | ICD-10-CM | POA: Diagnosis not present

## 2021-06-22 DIAGNOSIS — R1310 Dysphagia, unspecified: Secondary | ICD-10-CM | POA: Diagnosis not present

## 2021-06-22 LAB — RAD ONC ARIA SESSION SUMMARY
Course Elapsed Days: 38
Plan Fractions Treated to Date: 29
Plan Prescribed Dose Per Fraction: 2 Gy
Plan Total Fractions Prescribed: 30
Plan Total Prescribed Dose: 60 Gy
Reference Point Dosage Given to Date: 58 Gy
Reference Point Session Dosage Given: 2 Gy
Session Number: 29

## 2021-06-22 NOTE — Therapy (Addendum)
?OUTPATIENT PHYSICAL THERAPY TREATMENT NOTE ? ? ?Patient Name: Dean Ellison ?MRN: [id number] ?DOB:26-Feb-1956, 66 y.o., male ?Today's Date: 06/22/2021 ? ?PCP: Secundino Ginger, PA-C ?REFERRING PROVIDER: Eppie Gibson, MD ? ?END OF SESSION:  ? PT End of Session - 06/22/21 1057   ? ? Visit Number 4   ? Number of Visits 17  ? Date for PT Re-Evaluation 07/18/21   ? Authorization Type Medicare/Medicaid   ? PT Start Time 1035   ? PT Stop Time 1057   ? PT Time Calculation (min) 22 min   ? Activity Tolerance Patient tolerated treatment well   ? Behavior During Therapy [facility] for tasks assessed/performed   ? ?  ?  ? ?  ? ? ?Past Medical History:  ?Diagnosis Date  ? Anemia   ? Arthritis   ? CAD (coronary artery disease)   ? Carotid artery occlusion   ? COPD (chronic obstructive pulmonary disease) ([place])   ? Diabetes mellitus without complication ([place])   ? Dyspnea   ? Gout   ? Hyperlipidemia   ? Hypertension   ? OSA (obstructive sleep apnea)   ? does not use CPAP  ? Persistent disorder of initiating or maintaining sleep   ? Presence of permanent cardiac pacemaker   ? Vitamin D deficiency   ? ?Past Surgical History:  ?Procedure Laterality Date  ? ABDOMINAL AORTOGRAM W/LOWER EXTREMITY N/A 11/16/2019  ? Procedure: ABDOMINAL AORTOGRAM W/LOWER EXTREMITY;  Surgeon: Waynetta Sandy, MD;  Location: [place] [facility];  Service: Cardiovascular;  Laterality: N/A;  Bilateral ?  ? BACK SURGERY  1991  ? CARDIAC CATHETERIZATION Right 2013  ? CATARACT EXTRACTION Bilateral 1994  ? CORONARY ARTERY BYPASS GRAFT  2013  ? PACEMAKER INSERTION    ? TRANSCAROTID ARTERY REVASCULARIZATION (TCAR) Right 10/22/2018  ? TRANSCAROTID ARTERY REVASCULARIZATION REDO (Right Neck)  ? TRANSCAROTID ARTERY REVASCULARIZATION?  Right 07/18/2017  ? Procedure: TRANSCAROTID ARTERY REVASCULARIZATION;  Surgeon: Waynetta Sandy, MD;  Location: [place];  Service: Vascular;  Laterality: Right;  ? TRANSCAROTID ARTERY REVASCULARIZATION?  Right 10/22/2018  ?  Procedure: TRANSCAROTID ARTERY REVASCULARIZATION REDO;  Surgeon: Serafina Mitchell, MD;  Location: [facility] OR;  Service: Vascular;  Laterality: Right;  ? ?Patient Active Problem List  ? Diagnosis Date Noted  ? 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  Class: Diagnosis of  ? Multifocal pneumonia 06/18/2020  ? Asymptomatic stenosis of right carotid artery without infarction 10/22/2018  ? Presence of permanent cardiac pacemaker   ? Vitamin D deficiency   ? Persistent disorder of initiating or maintaining sleep   ? OSA (obstructive sleep apnea)   ? Hypertension   ? Hyperlipidemia   ? Gout   ? Diabetes mellitus without complication ([place])   ? COPD (chronic obstructive pulmonary disease) ([place])   ? Carotid artery occlusion   ? CAD (coronary artery disease)   ? Arthritis   ? Anemia   ? Carotid artery stenosis, asymptomatic, right 07/18/2017  ? ? 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 ?THERAPY DIAG:  ?Lymphedema, not elsewhere classified ? ?Aftercare following surgery for neoplasm ? 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 ?PERTINENT HISTORY: Total laryngectomy, bilateral neck dissection and TEP on 8/67/61 complicated by fistula. Has started radiation. Other hx: carotid stenosis s/p Rt stent, DM, CAD s/p CABG, pacemaker, COPD, kidney failure, CHF ?[place] for soft diet ? ?PRECAUTIONS: Pacemaker, carotid stenosis and stent Rt side - avoid bilateral carotid compression during MLD ? ?SUBJECTIVE: I fnish radiation tomorrow.  ? ?  PAIN:  ?Are you having pain? No ? ? ? ? ?OBJECTIVE ?  ?COGNITION: ?          Overall cognitive status: Within functional limits for tasks assessed                 ?  ?POSTURE:  ?Forward head and rounded shoulders posture ?  ?Observations: Tracheostomy in place, pt has small "pin size" (per patient's daughter in law) fistula above trach that she has been dressing with gauze.  Reports minimal to no drainage lately.   ?             ?  ?CERVICAL AROM: All with some "soreness" Lt >Rt with AROM ?  ?  degrees  ?Flexion  35  ?Extension 35 - some suboccipital pain  ?Right lateral flexion 15  ?Left lateral flexion 20  ?Right rotation 55  ?Left rotation 45 - left neck tightness  ?                        (Blank rows=not tested) ?  ?           Todays Treatment  ? ? 06/22/21: ? Pt arrived nearly 40 min late because he thought his appt was at 11 so treatment time was drastically shortened. Abbreviated MLD sequence as follows: In supine with head elevated on pillows: short neck, anterior throat and submental areas moving fluid towards pathways aimed at lateral neck then ending with short neck ? 06/15/21: ?Manual lymph drainage: In Supine with HOB elevated: Long neck sequence, 5 diaphragmatic breaths, bil shoulder collectors, bil axillary nodes, bil intact upper quadrant sequence, then anterior throat, submental and submandibular nodes, bil masseters, pre-and retroauricular nodes and suboccipital nodes redirecting towards bil axillary nodes reviewing light pressure with pt while performing and correct skin stretch, not slide.  ?STM: Gently to bil SCM during MLD ?P/ROM: During MLD into cervical rotation gently to pts tolerance and being mindful of trach ? ? 06/13/21: ?Manual lymph drainage: In Supine with HOB elevated: Long neck sequence, 5 diaphragmatic breaths, bil shoulder collectors, bil axillary nodes, bil intact upper quadrant sequence, then anterior throat, submental and submandibular nodes, bil masseters, pre-and retroauricular nodes and suboccipital nodes redirecting towards bil axillary nodes and beginnning to instruct son and daughter in law while performing and then had her and pt return brief demonstration.  ?STM: Briefly to Lt SCM during MLD ?  ?PATIENT EDUCATION: ?06/13/21:Education details: Self MLD    ?Person educated: Patient and Child(ren) ?Education method: Explanation, Demonstration, Tactile cues, Verbal cues, and Handouts  ?Education comprehension: verbalized understanding, returned demonstration, verbal cues required,  tactile cues required, and needs further education ? ?  ?ASSESSMENT: ?  ?CLINICAL IMPRESSION: ?Pt thought his appointment was at 38 so he had a very short treatment today. Continued with an abbreviated version of MLD today. His anterior and submental neck swelling is markedly decreased. He has been using his chip pack at home. He completes radiation tomorrow. He does have an open wound on right side of neck that may be from radiation or his daughter mentioned it could be his cancer. He will have it evaluated by his doctor in May.  ?  ?Pt will benefit from skilled therapeutic intervention to improve on the following deficits: Decreased knowledge of precautions and postural dysfunction. Other deficits: decreased knowledge of use of DME, decreased ROM, and increased edema ?  ?PT treatment/interventions: ADL/self-care home management, pt/family education, therapeutic exercise. Other interventions Therapeutic exercises,  Patient/Family education, DME instructions, Manual lymph drainage, Taping, and Manual therapy ?  ?REHAB POTENTIAL: Good ?  ?CLINICAL DECISION MAKING: Stable/uncomplicated ?  ?EVALUATION COMPLEXITY: Low ?  ?  ?GOALS: ?Goals reviewed with patient? YES ?  ?LONG TERM GOALS: (STG=LTG) ?  ?  Name Target Date Goal status  ?1 Patient will be able to verbalize understanding of a home exercise program for cervical range of motion, posture, and walking.   ?Baseline:  No knowledge 05/23/2021 Achieved at eval  ?2 Patient will be able to verbalize understanding of proper sitting and standing posture. ?Baseline:  No knowledge 05/23/2021 Achieved at eval  ?3 Patient will be ind with edema management throughout radiation with use of compression and self MLD Baseline:  No knowledge 06/22/21 Ongoing  ?4 Pt will decrease chin strap measurement to 24.3 or less to demonstrate decreased edema 06/22/21 Ongoing  ?  ?  ?  ?PLAN: ?PT FREQUENCY/DURATION: 1-2x per week x 4 weeks with hold as needed for radiation ?  ?PLAN FOR NEXT  SESSION: Cont (self) MLD and review technique with pt and daughter in law (when she can come). ?  ? ? ? ?Allyson Sabal [place], PT ?06/22/2021, 11:04 AM ? ?          06/13/21 ?Manual Lymph Drainage for face/neck ? ?Place

## 2021-06-23 ENCOUNTER — Telehealth: Payer: Self-pay

## 2021-06-23 ENCOUNTER — Other Ambulatory Visit: Payer: Self-pay

## 2021-06-23 ENCOUNTER — Encounter: Payer: Self-pay | Admitting: Radiation Oncology

## 2021-06-23 ENCOUNTER — Ambulatory Visit
Admission: RE | Admit: 2021-06-23 | Discharge: 2021-06-23 | Disposition: A | Discharge status: Home / Self Care | Payer: Medicare Other | Source: Ambulatory Visit | Attending: Radiation Oncology | Admitting: Radiation Oncology

## 2021-06-23 DIAGNOSIS — Z510 Encounter for antineoplastic radiation therapy: Secondary | ICD-10-CM | POA: Diagnosis not present

## 2021-06-23 LAB — RAD ONC ARIA SESSION SUMMARY
Course Elapsed Days: 39
Plan Fractions Treated to Date: 30
Plan Prescribed Dose Per Fraction: 2 Gy
Plan Total Fractions Prescribed: 30
Plan Total Prescribed Dose: 60 Gy
Reference Point Dosage Given to Date: 60 Gy
Reference Point Session Dosage Given: 2 Gy
Session Number: 30

## 2021-06-23 NOTE — Telephone Encounter (Signed)
Faxed copy of [employer] report from patient's pacemaker investigation after final radiation treatment to [facility] device [facility] (f: [phone]). Called and spoke with device clinic nurse Cyril Mourning (o: [phone]) who confirmed receival. ?

## 2021-06-23 NOTE — Progress Notes (Signed)
Oncology Nurse Navigator Documentation  ? ?Met with Mr. Baccam and his daughter-in-law after final RT to offer support and to celebrate end of radiation treatment.   ?Provided verbal/written post-RT guidance: ?Importance of keeping all follow-up appts, especially those with Nutrition and SLP. ?Importance of protecting treatment area from sun. ?Continuation of Sonafine application 2-3 times daily, application of antibiotic ointment to areas of raw skin; when supply of Sonafine exhausted transition to OTC lotion with vitamin E. ?Provided/reviewed Epic calendar of upcoming appts. ?Explained my role as navigator will continue for several more months, encouraged him to call me with needs/concerns.   ? ?Harlow Asa RN, BSN, OCN ?Head & Neck Oncology Nurse Navigator ?[place] [facility] ?Phone # [phone]  ?Fax # [phone]   ?

## 2021-06-27 ENCOUNTER — Encounter: Payer: Self-pay | Admitting: Physical Therapy

## 2021-06-27 ENCOUNTER — Ambulatory Visit: Payer: Medicare Other | Attending: Radiation Oncology | Admitting: Physical Therapy

## 2021-06-27 DIAGNOSIS — C329 Malignant neoplasm of larynx, unspecified: Secondary | ICD-10-CM | POA: Insufficient documentation

## 2021-06-27 DIAGNOSIS — Z483 Aftercare following surgery for neoplasm: Secondary | ICD-10-CM | POA: Diagnosis present

## 2021-06-27 DIAGNOSIS — I890 Lymphedema, not elsewhere classified: Secondary | ICD-10-CM | POA: Diagnosis present

## 2021-06-27 NOTE — Therapy (Signed)
?OUTPATIENT PHYSICAL THERAPY TREATMENT NOTE ? ? ?Patient Name: Dean Ellison ?MRN: [id number] ?DOB:August 25, 1955, 66 y.o., male ?Today's Date: 06/27/2021 ? ?PCP: Secundino Ginger, PA-C ?REFERRING PROVIDER: Eppie Gibson, MD ? ?END OF SESSION:  ? PT End of Session - 06/27/21 1048   ? ? Visit Number 5   ? Number of Visits 17   ? Date for PT Re-Evaluation 07/18/21   ? Authorization Type Medicare/Medicaid   ? PT Start Time 1005   ? PT Stop Time 1048   ? PT Time Calculation (min) 43 min   ? Activity Tolerance Patient tolerated treatment well   ? Behavior During Therapy [facility] for tasks assessed/performed   ? ?  ?  ? ?  ? ? ?Past Medical History:  ?Diagnosis Date  ? Anemia   ? Arthritis   ? CAD (coronary artery disease)   ? Carotid artery occlusion   ? COPD (chronic obstructive pulmonary disease) ([place])   ? Diabetes mellitus without complication ([place])   ? Dyspnea   ? Gout   ? Hyperlipidemia   ? Hypertension   ? OSA (obstructive sleep apnea)   ? does not use CPAP  ? Persistent disorder of initiating or maintaining sleep   ? Presence of permanent cardiac pacemaker   ? Vitamin D deficiency   ? ?Past Surgical History:  ?Procedure Laterality Date  ? ABDOMINAL AORTOGRAM W/LOWER EXTREMITY N/A 11/16/2019  ? Procedure: ABDOMINAL AORTOGRAM W/LOWER EXTREMITY;  Surgeon: Waynetta Sandy, MD;  Location: [place] [facility];  Service: Cardiovascular;  Laterality: N/A;  Bilateral ?  ? BACK SURGERY  1991  ? CARDIAC CATHETERIZATION Right 2013  ? CATARACT EXTRACTION Bilateral 1994  ? CORONARY ARTERY BYPASS GRAFT  2013  ? PACEMAKER INSERTION    ? TRANSCAROTID ARTERY REVASCULARIZATION (TCAR) Right 10/22/2018  ? TRANSCAROTID ARTERY REVASCULARIZATION REDO (Right Neck)  ? TRANSCAROTID ARTERY REVASCULARIZATION?  Right 07/18/2017  ? Procedure: TRANSCAROTID ARTERY REVASCULARIZATION;  Surgeon: Waynetta Sandy, MD;  Location: [place];  Service: Vascular;  Laterality: Right;  ? TRANSCAROTID ARTERY REVASCULARIZATION?  Right 10/22/2018  ?  Procedure: TRANSCAROTID ARTERY REVASCULARIZATION REDO;  Surgeon: Serafina Mitchell, MD;  Location: [facility] OR;  Service: Vascular;  Laterality: Right;  ? ?Patient Active Problem List  ? Diagnosis Date Noted  ? 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  Class: Diagnosis of  ? Multifocal pneumonia 06/18/2020  ? Asymptomatic stenosis of right carotid artery without infarction 10/22/2018  ? Presence of permanent cardiac pacemaker   ? Vitamin D deficiency   ? Persistent disorder of initiating or maintaining sleep   ? OSA (obstructive sleep apnea)   ? Hypertension   ? Hyperlipidemia   ? Gout   ? Diabetes mellitus without complication ([place])   ? COPD (chronic obstructive pulmonary disease) ([place])   ? Carotid artery occlusion   ? CAD (coronary artery disease)   ? Arthritis   ? Anemia   ? Carotid artery stenosis, asymptomatic, right 07/18/2017  ? ? 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 ?THERAPY DIAG:  ?Lymphedema, not elsewhere classified ? ?Aftercare following surgery for neoplasm ? ?Cancer of larynx ([place]) ? ?PERTINENT HISTORY: Total laryngectomy, bilateral neck dissection and TEP on 10/14/27 complicated by fistula. Has started radiation. Other hx: carotid stenosis s/p Rt stent, DM, CAD s/p CABG, pacemaker, COPD, kidney failure, CHF ?[place] for soft diet ? ?PRECAUTIONS: Pacemaker, carotid stenosis and stent Rt side - avoid bilateral carotid compression during MLD ? ?SUBJECTIVE: Nothing new to report ? ?  PAIN:  ?Are you having pain? No ? ? ? ? ?OBJECTIVE ?  ?COGNITION: ?          Overall cognitive status: Within functional limits for tasks assessed                 ?  ?POSTURE:  ?Forward head and rounded shoulders posture ?  ?Observations: Tracheostomy in place, pt has small "pin size" (per patient's daughter in law) fistula above trach that she has been dressing with gauze.  Reports minimal to no drainage lately.   ?             ?  ?CERVICAL AROM: All with some "soreness" Lt >Rt with AROM ?  ?  degrees  ?Flexion 35   ?Extension 35 - some suboccipital pain  ?Right lateral flexion 15  ?Left lateral flexion 20  ?Right rotation 55  ?Left rotation 45 - left neck tightness  ?                        (Blank rows=not tested) ?  ?           Todays Treatment  ? 06/27/21: ?In Supine with HOB elevated: Short neck, 5 diaphragmatic breaths, bilateral axillary nodes, bilateral pectoral nodes, supraclavicular nodes, posterior, lateral and anterior neck moving fluid towards pathway aimed at lateral neck then retracing all steps ? ?06/22/21: ?Pt arrived nearly 40 min late because he thought his appt was at 11 so treatment time was drastically shortened. Abbreviated MLD sequence as follows: In supine with head elevated on pillows: short neck, anterior throat and submental areas moving fluid towards pathways aimed at lateral neck then ending with short neck ? 06/15/21: ?Manual lymph drainage: In Supine with HOB elevated: Long neck sequence, 5 diaphragmatic breaths, bil shoulder collectors, bil axillary nodes, bil intact upper quadrant sequence, then anterior throat, submental and submandibular nodes, bil masseters, pre-and retroauricular nodes and suboccipital nodes redirecting towards bil axillary nodes reviewing light pressure with pt while performing and correct skin stretch, not slide.  ?STM: Gently to bil SCM during MLD ?P/ROM: During MLD into cervical rotation gently to pts tolerance and being mindful of trach ?  ?PATIENT EDUCATION: ?06/13/21:Education details: Self MLD    ?Person educated: Patient and Child(ren) ?Education method: Explanation, Demonstration, Tactile cues, Verbal cues, and Handouts  ?Education comprehension: verbalized understanding, returned demonstration, verbal cues required, tactile cues required, and needs further education ? ?  ?ASSESSMENT: ?  ?CLINICAL IMPRESSION: ?Pt returns to therapy today. He has now completed radiation. His edema is markedly improved. He does have some yellow exudate near stoma today and his daughter  reports this is normal for him and she cleans it. There is also some desquamation inferior to stoma that appears to be healing post radiation. Will continue with MLD to anterior neck to decrease edema.  ?  ?Pt will benefit from skilled therapeutic intervention to improve on the following deficits: Decreased knowledge of precautions and postural dysfunction. Other deficits: decreased knowledge of use of DME, decreased ROM, and increased edema ?  ?PT treatment/interventions: ADL/self-care home management, pt/family education, therapeutic exercise. Other interventions Therapeutic exercises, Patient/Family education, DME instructions, Manual lymph drainage, Taping, and Manual therapy ?  ?REHAB POTENTIAL: Good ?  ?CLINICAL DECISION MAKING: Stable/uncomplicated ?  ?EVALUATION COMPLEXITY: Low ?  ?  ?GOALS: ?Goals reviewed with patient? YES ?  ?LONG TERM GOALS: (STG=LTG) ?  ?  Name Target Date Goal status  ?1 Patient will be able to  verbalize understanding of a home exercise program for cervical range of motion, posture, and walking.   ?Baseline:  No knowledge 05/23/2021 Achieved at eval  ?2 Patient will be able to verbalize understanding of proper sitting and standing posture. ?Baseline:  No knowledge 05/23/2021 Achieved at eval  ?3 Patient will be ind with edema management throughout radiation with use of compression and self MLD Baseline:  No knowledge 06/22/21 Ongoing  ?4 Pt will decrease chin strap measurement to 24.3 or less to demonstrate decreased edema 06/22/21 Ongoing  ?  ?  ?  ?PLAN: ?PT FREQUENCY/DURATION: 1-2x per week x 4 weeks with hold as needed for radiation ?  ?PLAN FOR NEXT SESSION: Cont (self) MLD and review technique with pt and daughter in law (when she can come). ?  ? ? ? ?Allyson Sabal [place], PT ?06/27/2021, 10:51 AM ? ?          06/13/21 ?Manual Lymph Drainage for face/neck ? ?Place hands on either side of neck and do 15-20 stationary circles  ?Place hands on areas just above collar bones and do 15-20  stationary circles ?Do stationary circles on both shoulders (about 10 times) ?Do stationary circles at both armpit areas (about 10 times) ?Take 5 deep breaths ?Standing at the head of the bed, do stationary circles

## 2021-06-27 NOTE — Addendum Note (Signed)
Addended by: Wynelle Beckmann, Hilaria Ota L on: 06/27/2021 11:01 AM ? ? Modules accepted: Orders ? ?

## 2021-06-29 ENCOUNTER — Ambulatory Visit: Payer: Medicare Other | Admitting: Rehabilitation

## 2021-06-29 ENCOUNTER — Encounter: Payer: Self-pay | Admitting: Rehabilitation

## 2021-06-29 DIAGNOSIS — Z483 Aftercare following surgery for neoplasm: Secondary | ICD-10-CM

## 2021-06-29 DIAGNOSIS — I890 Lymphedema, not elsewhere classified: Secondary | ICD-10-CM | POA: Diagnosis not present

## 2021-06-29 DIAGNOSIS — C329 Malignant neoplasm of larynx, unspecified: Secondary | ICD-10-CM

## 2021-06-29 NOTE — Therapy (Addendum)
OUTPATIENT PHYSICAL THERAPY TREATMENT NOTE   Patient Name: Dean Ellison MRN: [id number] DOB:12-17-1955, 66 y.o., male Today's Date: 06/29/2021  PCP: Gwendel Hanson REFERRING PROVIDER: Eppie Gibson, MD  END OF SESSION:   PT End of Session - 06/29/21 0949     Visit Number 6    Number of Visits 17    Date for PT Re-Evaluation 07/18/21    PT Start Time 6195    PT Stop Time 1038    PT Time Calculation (min) 43 min    Activity Tolerance Patient tolerated treatment well    Behavior During Therapy WFL for tasks assessed/performed             Past Medical History:  Diagnosis Date   Anemia    Arthritis    CAD (coronary artery disease)    Carotid artery occlusion    COPD (chronic obstructive pulmonary disease) ([place])    Diabetes mellitus without complication ([place])    Dyspnea    Gout    Hyperlipidemia    Hypertension    OSA (obstructive sleep apnea)    does not use CPAP   Persistent disorder of initiating or maintaining sleep    Presence of permanent cardiac pacemaker    Vitamin D deficiency    Past Surgical History:  Procedure Laterality Date   ABDOMINAL AORTOGRAM W/LOWER EXTREMITY N/A 11/16/2019   Procedure: ABDOMINAL AORTOGRAM W/LOWER EXTREMITY;  Surgeon: Waynetta Sandy, MD;  Location: [facility];  Service: Cardiovascular;  Laterality: N/A;  Bilateral    BACK SURGERY  1991   CARDIAC CATHETERIZATION Right 2013   CATARACT EXTRACTION Bilateral 1994   CORONARY ARTERY BYPASS GRAFT  2013   PACEMAKER INSERTION     TRANSCAROTID ARTERY REVASCULARIZATION (TCAR) Right 10/22/2018   TRANSCAROTID ARTERY REVASCULARIZATION REDO (Right Neck)   TRANSCAROTID ARTERY REVASCULARIZATION  Right 07/18/2017   Procedure: TRANSCAROTID ARTERY REVASCULARIZATION;  Surgeon: Waynetta Sandy, MD;  Location: [facility];  Service: Vascular;  Laterality: Right;   TRANSCAROTID ARTERY REVASCULARIZATION  Right 10/22/2018   Procedure: TRANSCAROTID ARTERY REVASCULARIZATION  REDO;  Surgeon: Serafina Mitchell, MD;  Location: [facility] OR;  Service: Vascular;  Laterality: Right;   Patient Active Problem List   Diagnosis Date Noted   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    Class: Diagnosis of   Multifocal pneumonia 06/18/2020   Asymptomatic stenosis of right carotid artery without infarction 10/22/2018   Presence of permanent cardiac pacemaker    Vitamin D deficiency    Persistent disorder of initiating or maintaining sleep    OSA (obstructive sleep apnea)    Hypertension    Hyperlipidemia    Gout    Diabetes mellitus without complication (HCC)    COPD (chronic obstructive pulmonary disease) ([place])    Carotid artery occlusion    CAD (coronary artery disease)    Arthritis    Anemia    Carotid artery stenosis, asymptomatic, right 07/18/2017    REFERRING DIAG:  C32.9 (ICD-10-CM) - 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 Has started radiation. Other hx: carotid stenosis s/p Rt stent, DM, CAD s/p CABG, pacemaker, COPD, kidney failure, CHF Ok for soft diet  PRECAUTIONS: Pacemaker, carotid stenosis and stent Rt side - avoid bilateral carotid compression during MLD  SUBJECTIVE: Nothing new to report  PAIN:  Are you having  pain? No  OBJECTIVE   COGNITION:           Overall cognitive status: Within functional limits for tasks assessed             POSTURE:  Forward head and rounded shoulders posture  Observations: Tracheostomy in place, pt has small "pin size" (per patient's daughter in law) fistula above trach that she has been dressing with gauze.  Reports minimal to no drainage lately.                 CERVICAL AROM: All with some "soreness" Lt >Rt with AROM     degrees  Flexion 35  Extension 35 - some suboccipital pain  Right lateral flexion 15   Left lateral flexion 20  Right rotation 55  Left rotation 45 - left neck tightness                          (Blank rows=not tested)              Todays Treatment  06/29/21 In Supine with HOB elevated: Short neck, 5 diaphragmatic breaths, bilateral axillary nodes, bilateral pectoral nodes, supraclavicular nodes, posterior, lateral and anterior neck moving fluid towards pathway aimed at lateral neck, auricular nodes, submental and submandibular nodes and work along chin and mandible, modifiying pattern a bit to work around more lateral than towards trach at midline,  then retracing all steps   06/27/21: In Supine with HOB elevated: Short neck, 5 diaphragmatic breaths, bilateral axillary nodes, bilateral pectoral nodes, supraclavicular nodes, posterior, lateral and anterior neck moving fluid towards pathway aimed at lateral neck then retracing all steps  06/22/21: Pt arrived nearly 40 min late because he thought his appt was at 57 so treatment time was drastically shortened. Abbreviated MLD sequence as follows: In supine with head elevated on pillows: short neck, anterior throat and submental areas moving fluid towards pathways aimed at lateral neck then ending with short neck    PATIENT EDUCATION: 06/13/21:Education details: Self MLD    Person educated: Patient and Child(ren) Education method: Explanation, Demonstration, Tactile cues, Verbal cues, and Handouts  Education comprehension: verbalized understanding, returned demonstration, verbal cues required, tactile cues required, and needs further education    ASSESSMENT:   CLINICAL IMPRESSION: Continued MLD to neck per above.  Still having wound healing issues surrounding trach. No tenderness to touch but reports tender surrounding trach.    Pt will benefit from skilled therapeutic intervention to improve on the following deficits: Decreased knowledge of precautions and postural dysfunction. Other deficits: decreased knowledge of use of DME,  decreased ROM, and increased edema   PT treatment/interventions: ADL/self-care home management, pt/family education, therapeutic exercise. Other interventions Therapeutic exercises, Patient/Family education, DME instructions, Manual lymph drainage, Taping, and Manual therapy   REHAB POTENTIAL: Good   CLINICAL DECISION MAKING: Stable/uncomplicated   EVALUATION COMPLEXITY: Low     GOALS: Goals reviewed with patient? YES   LONG TERM GOALS: (STG=LTG)     Name Target Date Goal status  1 Patient will be able to verbalize understanding of a home exercise program for cervical range of motion, posture, and walking.   Baseline:  No knowledge 05/23/2021 Achieved at eval  2 Patient will be able to verbalize understanding of proper sitting and standing posture. Baseline:  No knowledge 05/23/2021 Achieved at eval  3 Patient will be ind with edema management throughout radiation with use of compression and self MLD Baseline:  No knowledge 06/22/21 MET  4 Pt will decrease chin strap measurement to 24.3 or less to demonstrate decreased edema 06/22/21 NOT MET        PLAN: PT FREQUENCY/DURATION: 1-2x per week x 4 weeks with hold as needed for radiation   PLAN FOR NEXT SESSION: Cont (self) MLD and review technique with pt and daughter in law (when she can come). Get solaris trach cut garment?       Stark Bray, PT 06/29/2021, 10:41 AM            06/13/21 Manual Lymph Drainage for face/neck  Place hands on either side of neck and do 15-20 stationary circles  Place hands on areas just above collar bones and do 15-20 stationary circles Do stationary circles on both shoulders (about 10 times) Do stationary circles at both armpit areas (about 10 times) Take 5 deep breaths Standing at the head of the bed, do stationary circles on each side of the neck just below the jaw line (15-20) Standing at the head of the bed, do downward circles just underneath the chin (15-20) Standing at the head of the bed,  do stationary circles on each side of the face on the cheeks just above the jaw line (15-20) Standing at the head of the bed, do stationary downward circles on each side of the face between the eyes and ears (15-20) Repeat step 1, 2, 3, and 4  Cancer Rehab [phone]    Do not slide on the skin Only give enough pressure to stretch the skin Make sure to always wash your hands prior to massage   Patient will benefit from skilled therapeutic intervention in order to improve the following deficits and impairments:     Visit Diagnosis: Lymphedema, not elsewhere classified  Aftercare following surgery for neoplasm  Cancer of larynx [facility])     Problem List Patient Active Problem List   Diagnosis Date Noted   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    Class: Diagnosis of   Multifocal pneumonia 06/18/2020   Asymptomatic stenosis of right carotid artery without infarction 10/22/2018   Presence of permanent cardiac pacemaker    Vitamin D deficiency    Persistent disorder of initiating or maintaining sleep    OSA (obstructive sleep apnea)    Hypertension    Hyperlipidemia    Gout    Diabetes mellitus without complication (HCC)    COPD (chronic obstructive pulmonary disease) (HCC)    Carotid artery occlusion    CAD (coronary artery disease)    Arthritis    Anemia    Carotid artery stenosis, asymptomatic, right 07/18/2017    Stark Bray, PT 06/29/2021, 10:41 AM  [facility] [place], [place], [address] Phone: [phone]   Fax:  [phone]  Name: Dean Ellison MRN: [id number] Date of Birth: [birth year]  PHYSICAL THERAPY DISCHARGE SUMMARY  Visits from Start of Care: 6  Current functional level related to goals / functional outcomes: See above   Remaining deficits: Head and neck lymphedema   Education / Equipment: Did not return after last visit  Plan: Patient agrees to discharge.  Patient goals were  not met.    Shan Levans, PT

## 2021-07-04 ENCOUNTER — Encounter: Payer: Self-pay | Admitting: Nutrition

## 2021-07-04 ENCOUNTER — Inpatient Hospital Stay: Payer: Medicare Other | Attending: Oncology | Admitting: Nutrition

## 2021-07-04 ENCOUNTER — Ambulatory Visit: Payer: Medicare Other | Admitting: Physical Therapy

## 2021-07-04 NOTE — Progress Notes (Signed)
Patient did not show up for nutrition appointment. 

## 2021-07-07 ENCOUNTER — Ambulatory Visit: Payer: Medicare Other

## 2021-07-07 ENCOUNTER — Encounter: Payer: Medicare Other | Admitting: Rehabilitation

## 2021-07-07 NOTE — Therapy (Signed)
[facility] ?[address], [address] ?[place], [place], [address] ?Phone: [phone]   Fax:  [phone] ? ?Patient Details  ?Name: Dean Ellison ?MRN: [id number] ?Date of Birth: 04/23/1955 ?Referring Provider:  Eppie Gibson, MD ? ?Encounter Date: 07/07/2021 ? ? ?Speech Therapy (ST) - NO SHOW ? ?Pt no-showed to his scheduled ST appointment today. He will be called to reschedule.  ? ? ?[place], [place] ?07/07/2021, 11:25 AM ? ?[facility] ?[address], [address] ?[place], [place], [address] ?Phone: [phone]   Fax:  [phone] ?

## 2021-07-18 ENCOUNTER — Telehealth: Payer: Self-pay

## 2021-07-18 ENCOUNTER — Ambulatory Visit: Payer: Medicare Other | Attending: Radiation Oncology | Admitting: Radiation Oncology

## 2021-07-18 NOTE — Telephone Encounter (Signed)
Called patient's daughter-in-law Nira Conn x2 to see if patient was still intending to come for F/U with Dr. Isidore Moos today, or needed to reschedule. Unable to leave VM because mailbox was full. Will have nursing secretary reach out to reschedule at a later date

## 2021-07-18 NOTE — Progress Notes (Signed)
                                                                                                                                                             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[place]) Staging form: Larynx - Glottis, AJCC 8th Edition - Clinical: Stage IVA (cT4a, cN1, cM0) - Signed by Derwood Kaplan, MD on 02/05/2021 Histologic grade (G): GX Histologic grading system: 3 grade system Laterality: Left Tumor size (mm): 23 Lymph-vascular invasion (LVI): Presence of LVI unknown/indeterminate Diagnostic confirmation: Positive histology Specimen type: Endoscopy with Biopsy Staged by: Managing physician Presence of extranodal extension: Unknown ECOG performance status: Grade 1 Perineural invasion (PNI): Unknown Prognostic indicators: Negative for P16 Stage used in treatment planning: Yes National guidelines used in treatment planning: Yes Type of national guideline used in treatment planning: NCCN   mpT4a pN2c   Intent: Curative  Radiation Treatment Dates: 05/15/2021 through 06/23/2021 Site Technique Total Dose (Gy) Dose per Fx (Gy) Completed Fx Beam Energies  Neck: HN_larynx IMRT 60/60 2 30/30 6X   Narrative: The patient tolerated radiation therapy relatively well.   Plan: The patient will follow-up with radiation oncology in 3 weeks. -----------------------------------  Eppie Gibson, MD

## 2021-07-28 ENCOUNTER — Ambulatory Visit: Payer: Medicare Other | Attending: Radiation Oncology | Admitting: Radiation Oncology

## 2021-07-28 DIAGNOSIS — Z510 Encounter for antineoplastic radiation therapy: Secondary | ICD-10-CM | POA: Insufficient documentation

## 2021-07-28 DIAGNOSIS — C320 Malignant neoplasm of glottis: Secondary | ICD-10-CM | POA: Insufficient documentation

## 2021-07-28 NOTE — Progress Notes (Incomplete)
Dean Ellison presents today for follow-up after completing radiation to his larynx on 06/23/2021  Pain issues, if any: *** Using a feeding tube?: *** Weight changes, if any: ***  Swallowing issues, if any: *** Smoking or chewing tobacco? *** Using fluoride trays daily? *** Last ENT visit was on: 07/05/2021 Saw Dr. Francina Ames: "--Physical Exam The fistula site has closed  He developed a lesion on the neck skin at the left lower neck under the trach collar. Seems to be improving.  He is having no pain.  Has been swallowing some soft foods, small bites.  He denies fevers, bleeding, or other symptoms. No neck masses or enlarged lymph nodes  No problems with stoma, using Lary tube and HME. TEP without leakage --Impression There is no evidence of disease on exam today.  I will see him back in 2 months, certainly sooner if there are problems or questions  OK to start working with Candace on TEP speech.    Other notable issues, if any: ***

## 2022-04-10 NOTE — Therapy (Signed)
[facility] [address], [place], [place], [address] Phone: [phone]   Fax:  [phone]  Patient Details  Name: Dean Ellison MRN: [id number] Date of Birth: [birth year] Referring Provider:  Eppie Gibson, MD  Encounter Date: 04/10/2022  SPEECH THERAPY DISCHARGE SUMMARY  Visits from Start of Care: 1  Current functional level related to goals / functional outcomes: Pt was seen for one session (eval) and then chose to receive ST services at [facility].   Remaining deficits: Assumed deficits remain.   Education / Equipment: HEP procedure, late effects head/neck radiation on swallowing   Patient agrees to discharge. Patient goals were not met. Patient is being discharged due to not returning since the last visit.Marland Kitchen    [facility], [place] 04/10/2022, 2:49 PM  [facility] [address], [place], [place], [address] Phone: [phone]   Fax:  [phone]

## 2022-04-11 ENCOUNTER — Telehealth: Payer: Self-pay

## 2022-04-11 NOTE — Telephone Encounter (Signed)
Patient's son Dean Ellison) called into office stating his dad had a recent Home Visit with [employer]. They advised pt had blood flow concerns in his legs and would need to follow up with our office. Dean Ellison had a overdue Recall appt that needed to be scheduled with [facility]. Appt scheduled. Per pts request and family's request pt only wants to be seen by MD. Appt scheduled with Dr. Donzetta Matters.

## 2022-04-27 ENCOUNTER — Other Ambulatory Visit: Payer: Self-pay | Admitting: *Deleted

## 2022-04-27 DIAGNOSIS — I6523 Occlusion and stenosis of bilateral carotid arteries: Secondary | ICD-10-CM

## 2022-04-27 DIAGNOSIS — I739 Peripheral vascular disease, unspecified: Secondary | ICD-10-CM

## 2022-05-09 ENCOUNTER — Ambulatory Visit: Payer: Medicare HMO | Admitting: Vascular Surgery

## 2022-05-09 ENCOUNTER — Ambulatory Visit (HOSPITAL_COMMUNITY)
Admission: RE | Admit: 2022-05-09 | Discharge: 2022-05-09 | Disposition: A | Discharge status: Home / Self Care | Payer: Medicare HMO | Source: Ambulatory Visit | Attending: Vascular Surgery

## 2022-05-09 ENCOUNTER — Ambulatory Visit (HOSPITAL_COMMUNITY)
Admission: RE | Admit: 2022-05-09 | Discharge: 2022-05-09 | Disposition: A | Discharge status: Home / Self Care | Payer: Medicare HMO | Source: Ambulatory Visit | Attending: Vascular Surgery | Admitting: Vascular Surgery

## 2022-05-09 VITALS — BP 201/105 | HR 68 | Temp 97.9°F | Resp 20 | Ht 70.0 in | Wt 182.0 lb

## 2022-05-09 DIAGNOSIS — I6523 Occlusion and stenosis of bilateral carotid arteries: Secondary | ICD-10-CM

## 2022-05-09 DIAGNOSIS — I739 Peripheral vascular disease, unspecified: Secondary | ICD-10-CM | POA: Diagnosis not present

## 2022-05-09 LAB — VAS US ABI WITH/WO TBI
Left ABI: 1
Right ABI: 1.05

## 2022-05-09 NOTE — Progress Notes (Signed)
Patient ID: Dean Ellison, male   DOB: 07-12-1955, 67 y.o.   MRN: [id number]  Reason for Consult: Follow-up   Referred by Secundino Ginger, PA-C  Subjective:     HPI:  Dean Ellison is a 67 y.o. male well-known to me from previous transcarotid artery stenting and required subsequent stenting for in-stent restenosis.  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  He has quit smoking now for 18 months.  He does have hypertension, hyperlipidemia and diabetes.  He remains on a statin, aspirin and Xarelto.  Past Medical History:  Diagnosis Date   Anemia    Arthritis    CAD (coronary artery disease)    Carotid artery occlusion    COPD (chronic obstructive pulmonary disease) (HCC)    Diabetes mellitus without complication (HCC)    Dyspnea    Gout    Hyperlipidemia    Hypertension    OSA (obstructive sleep apnea)    does not use CPAP   Persistent disorder of initiating or maintaining sleep    Presence of permanent cardiac pacemaker    Vitamin D deficiency    Family History  Problem Relation Age of Onset   Heart disease Mother    Diabetes Mother    Hypertension Mother    Arthritis Mother    Heart disease Father    Diabetes Father    Hypertension Father    Arthritis Father    Breast cancer Sister 54       maternal half-sister   Skin cancer Brother        8s   Lung cancer Maternal Aunt        60+   Colon cancer Maternal Aunt        60+   Prostate cancer Maternal Uncle        60+   Past Surgical History:  Procedure Laterality Date   ABDOMINAL AORTOGRAM W/LOWER EXTREMITY N/A 11/16/2019   Procedure: ABDOMINAL AORTOGRAM W/LOWER EXTREMITY;  Surgeon: Waynetta Sandy, MD;  Location: [facility];  Service: Cardiovascular;  Laterality: N/A;  Bilateral    BACK SURGERY  1991   CARDIAC CATHETERIZATION Right 2013   CATARACT EXTRACTION Bilateral 1994   CORONARY ARTERY BYPASS GRAFT  2013   PACEMAKER INSERTION     TRANSCAROTID ARTERY  REVASCULARIZATION (TCAR) Right 10/22/2018   TRANSCAROTID ARTERY REVASCULARIZATION REDO (Right Neck)   TRANSCAROTID ARTERY REVASCULARIZATION  Right 07/18/2017   Procedure: TRANSCAROTID ARTERY REVASCULARIZATION;  Surgeon: Waynetta Sandy, MD;  Location: [place];  Service: Vascular;  Laterality: Right;   TRANSCAROTID ARTERY REVASCULARIZATION  Right 10/22/2018   Procedure: TRANSCAROTID ARTERY REVASCULARIZATION REDO;  Surgeon: Serafina Mitchell, MD;  Location: [facility];  Service: Vascular;  Laterality: Right;    Short Social History:  Social History   Tobacco Use   Smoking status: Former    Packs/day: 0.25    Years: 40.00    Total pack years: 10.00    Types: Cigarettes    Quit date: 12/20/2020    Years since quitting: 1.3   Smokeless tobacco: Never   Tobacco comments:    4 cigarettes a week  Substance Use Topics   Alcohol use: Yes    Alcohol/week: 1.0 standard drink of alcohol    Types: 1 Cans of beer per week    Comment: 1 every 6 months or more    Allergies  Allergen Reactions   Meperidine Other (See Comments)    Demerol. Pt stated "  it made me crazy"    Current Outpatient Medications  Medication Sig Dispense Refill   amiodarone (PACERONE) 200 MG tablet Take 200 mg by mouth daily.     aspirin EC 81 MG tablet Take 81 mg by mouth daily.     atenolol (TENORMIN) 50 MG tablet Take 50 mg by mouth 2 (two) times daily.      atorvastatin (LIPITOR) 40 MG tablet Take 80 mg by mouth daily.     Blood Glucose Monitoring Suppl (TRUE METRIX METER) w/Device KIT      Evolocumab (REPATHA SURECLICK) [id number] MG/ML SOAJ Inject into the skin.     famotidine (PEPCID) 20 MG tablet Take 20 mg by mouth 2 (two) times daily.     fluticasone (FLONASE) 50 MCG/ACT nasal spray Place 2 sprays into both nostrils daily. 1 g 0   furosemide (LASIX) 20 MG tablet Take 20 mg by mouth daily.      glipiZIDE (GLUCOTROL) 5 MG tablet Take 5 mg by mouth daily.     Icosapent Ethyl (VASCEPA) 1 g CAPS Take 2 g by mouth 2  (two) times daily.     insulin aspart (NOVOLOG FLEXPEN) 100 UNIT/ML FlexPen Inject under skin 3 times a day for mealtime and correction per MD instructions. Maximum of 20 units per day.     isosorbide mononitrate (IMDUR) 30 MG 24 hr tablet Take 30 mg by mouth daily.     JARDIANCE 10 MG TABS tablet Take 10 mg by mouth daily.     latanoprost (XALATAN) 0.005 % ophthalmic solution Place 1 drop into both eyes at bedtime.  5   lidocaine (XYLOCAINE) 2 % solution Patient: Mix 1part 2% viscous lidocaine, 1part H20. Swish & swallow 27m of diluted mixture, 340m before meals and at bedtime, up to QID 200 mL 3   loratadine-pseudoephedrine (CLARITIN-D 12 HOUR) 5-120 MG tablet Take 1 tablet by mouth 2 (two) times daily. 14 tablet 0   nitroGLYCERIN (NITROSTAT) 0.4 MG SL tablet Place 0.4 mg under the tongue every 5 (five) minutes as needed for chest pain.     omeprazole (PRILOSEC) 40 MG capsule Take 40 mg by mouth daily.     sitaGLIPtin (JANUVIA) 100 MG tablet Take 100 mg by mouth daily.     tiotropium (SPIRIVA) 18 MCG inhalation capsule Place 18 mcg into inhaler and inhale daily.      TRUE METRIX BLOOD GLUCOSE TEST test strip      TRUEplus Lancets 33G MISC      varenicline (CHANTIX) 1 MG tablet Take 1 mg by mouth 2 (two) times daily.     XARELTO 2.5 MG TABS tablet Take 2.5 mg by mouth 2 (two) times daily.      albuterol (VENTOLIN HFA) 108 (90 Base) MCG/ACT inhaler Inhale 1-2 puffs into the lungs every 6 (six) hours as needed for wheezing or shortness of breath. 1 g 0   insulin glargine (LANTUS) 100 UNIT/ML Solostar Pen Inject into the skin.     No current facility-administered medications for this visit.    Review of Systems  Constitutional:  Constitutional negative. HENT: HENT negative.  Eyes: Eyes negative.  Respiratory: Respiratory negative.  Cardiovascular: Cardiovascular negative.  GI: Gastrointestinal negative.  Musculoskeletal: Musculoskeletal negative. Positive for leg pain.  Neurological:  Neurological negative. Hematologic: Hematologic/lymphatic negative.  Psychiatric: Psychiatric negative.        Objective:  Objective   Vitals:   05/09/22 1205 05/09/22 1206  BP: (!) 189/100 (!) 201/105  Pulse: 68   Resp: 20  Temp: 97.9 F (36.6 C)   SpO2: (!) 89%      Physical Exam HENT:     Head: Normocephalic.     Comments: Trach with speaking valve    Mouth/Throat:     Mouth: Mucous membranes are moist.  Eyes:     Pupils: Pupils are equal, round, and reactive to light.  Neck:     Vascular: No carotid bruit.  Cardiovascular:     Rate and Rhythm: Normal rate.     Heart sounds: Normal heart sounds.  Pulmonary:     Effort: Pulmonary effort is normal.  Abdominal:     General: Abdomen is flat.     Palpations: Abdomen is soft.  Musculoskeletal:     Right lower leg: No edema.     Left lower leg: No edema.  Skin:    General: Skin is warm.     Capillary Refill: Capillary refill takes less than 2 seconds.  Neurological:     General: No focal deficit present.     Mental Status: He is alert.  Psychiatric:        Mood and Affect: Mood normal.        Thought Content: Thought content normal.     Data: Right Carotid Findings:  +----------+--------+--------+--------+--------------------------+--------+            PSV cm/sEDV cm/sStenosisPlaque Description         Comments  +----------+--------+--------+--------+--------------------------+--------+   CCA Prox  65      12                                                   +----------+--------+--------+--------+--------------------------+--------+   CCA Mid   56      8                                                    +----------+--------+--------+--------+--------------------------+--------+   CCA Distal44      9               heterogenous and irregularstent      +----------+--------+--------+--------+--------------------------+--------+   ICA Prox                                                     stent      +----------+--------+--------+--------+--------------------------+--------+   ICA Mid                                                     stent      +----------+--------+--------+--------+--------------------------+--------+   ICA Distal91      22                                                   +----------+--------+--------+--------+--------------------------+--------+   ECA  229     21      >50%    calcific                             +----------+--------+--------+--------+--------------------------+--------+    +----------+--------+-------+----------------+-------------------+           PSV cm/sEDV cmsDescribe        Arm Pressure (mmHG)  +----------+--------+-------+----------------+-------------------+  Subclavian117    2      Multiphasic, [id number]                  +----------+--------+-------+----------------+-------------------+   +---------+--------+--+--------+--+---------+  VertebralPSV cm/s41EDV cm/s12Antegrade  +---------+--------+--+--------+--+---------+      Right Stent(s):  +---------------+--+--++++  Prox to Stent  6513  +---------------+--+--++++  Proximal Stent 8514  +---------------+--+--++++  Mid Stent      8319  +---------------+--+--++++  Distal Stent   8818  +---------------+--+--++++  Distal to [id number]  +---------------+--+--++++          Left Carotid Findings:  +---------+--------+-------+--------+---------------------------------+----  ----+          PSV cm/sEDV    StenosisPlaque Description                Comments                  cm/s                                                       +---------+--------+-------+--------+---------------------------------+----  ----+  CCA Prox 72      11                                                          +---------+--------+-------+--------+---------------------------------+----  ----+  CCA Mid  87      14     <50%    heterogenous                                +---------+--------+-------+--------+---------------------------------+----  ----+  CCA     76      11             heterogenous                                Distal                                                                      +---------+--------+-------+--------+---------------------------------+----  ----+  ICA Prox 185     55     40-59%  heterogenous, calcific and  irregular                                   +---------+--------+-------+--------+---------------------------------+----  ----+  ICA Mid  129     24                                                         +---------+--------+-------+--------+---------------------------------+----  ----+  ICA     105     18                                                         Distal                                                                      +---------+--------+-------+--------+---------------------------------+----  ----+  ECA     70      11             heterogenous                                +---------+--------+-------+--------+---------------------------------+----  ----+   +----------+--------+--------+----------------+-------------------+           PSV cm/sEDV cm/sDescribe        Arm Pressure (mmHG)  +----------+--------+--------+----------------+-------------------+  [id number]    6       Multiphasic, [id number]                  +----------+--------+--------+----------------+-------------------+   +---------+--------+--+--------+-+---------+  VertebralPSV cm/s43EDV cm/s9Antegrade  +---------+--------+--+--------+-+---------+         Summary:  Right Carotid: Patent stent with no visualized stenosis. The ECA  appears  >50%                stenosed.   Left Carotid: Velocities in the left ICA are consistent with a 40-59%  stenosis.   Vertebrals: Bilateral vertebral arteries demonstrate antegrade flow.  Subclavians: Normal flow hemodynamics were seen in bilateral subclavian               arteries. .   ABI Findings:  +---------+------------------+-----+--------+--------+  Right   Rt Pressure (mmHg)IndexWaveformComment   +---------+------------------+-----+--------+--------+  Brachial 183                                      +---------+------------------+-----+--------+--------+  PTA     207               1.05 biphasic          +---------+------------------+-----+--------+--------+  DP      195               0.99 biphasic          +---------+------------------+-----+--------+--------+  Nicolasa Ducking  0.66 Abnormal          +---------+------------------+-----+--------+--------+   +---------+------------------+-----+--------+-------+  Left    Lt Pressure (mmHg)IndexWaveformComment  +---------+------------------+-----+--------+-------+  Brachial 197                                     +---------+------------------+-----+--------+-------+  PTA     192               0.97 biphasic         +---------+------------------+-----+--------+-------+  DP      197               1.00 biphasic         +---------+------------------+-----+--------+-------+  Great Toe177               0.90 Normal           +---------+------------------+-----+--------+-------+   +-------+-----------+-----------+------------+------------+  ABI/TBIToday's ABIToday's TBIPrevious ABIPrevious TBI  +-------+-----------+-----------+------------+------------+  Right 1.05       0.66       0.94        0.97          +-------+-----------+-----------+------------+------------+  Left  1.00       0.90       0.99        0.69           +-------+-----------+-----------+------------+------------+         Right ABIs appear increased. Left TBIs appear increased.    Summary:  Right: Resting right ankle-brachial index is within normal range. The  right toe-brachial index is abnormal.   Left: Resting left ankle-brachial index is within normal range. The left  toe-brachial index is normal.      Assessment/Plan:    67 year old male with history of transcarotid artery stenting and required repeat stent for in-stent restenosis now without any evidence of recurrent stenosis and preserved ABIs bilaterally.  Patient is maximally medically managed and will follow-up in 1 year with repeat carotid duplex.     Waynetta Sandy MD [facility]

## 2022-06-15 DIAGNOSIS — R079 Chest pain, unspecified: Secondary | ICD-10-CM

## 2023-02-14 IMAGING — CR DG CHEST 2V
3 series · 3 of 3 positions shown · non-contrast
Comparison: 08/18/2018

CLINICAL DATA: Shortness of breath, cough, nasal congestion,
headache, AKPHA-VY exposure, worsening shortness of breath over 3
days

EXAM:
CHEST - 2 VIEW

[chest pa (1 of 2)]
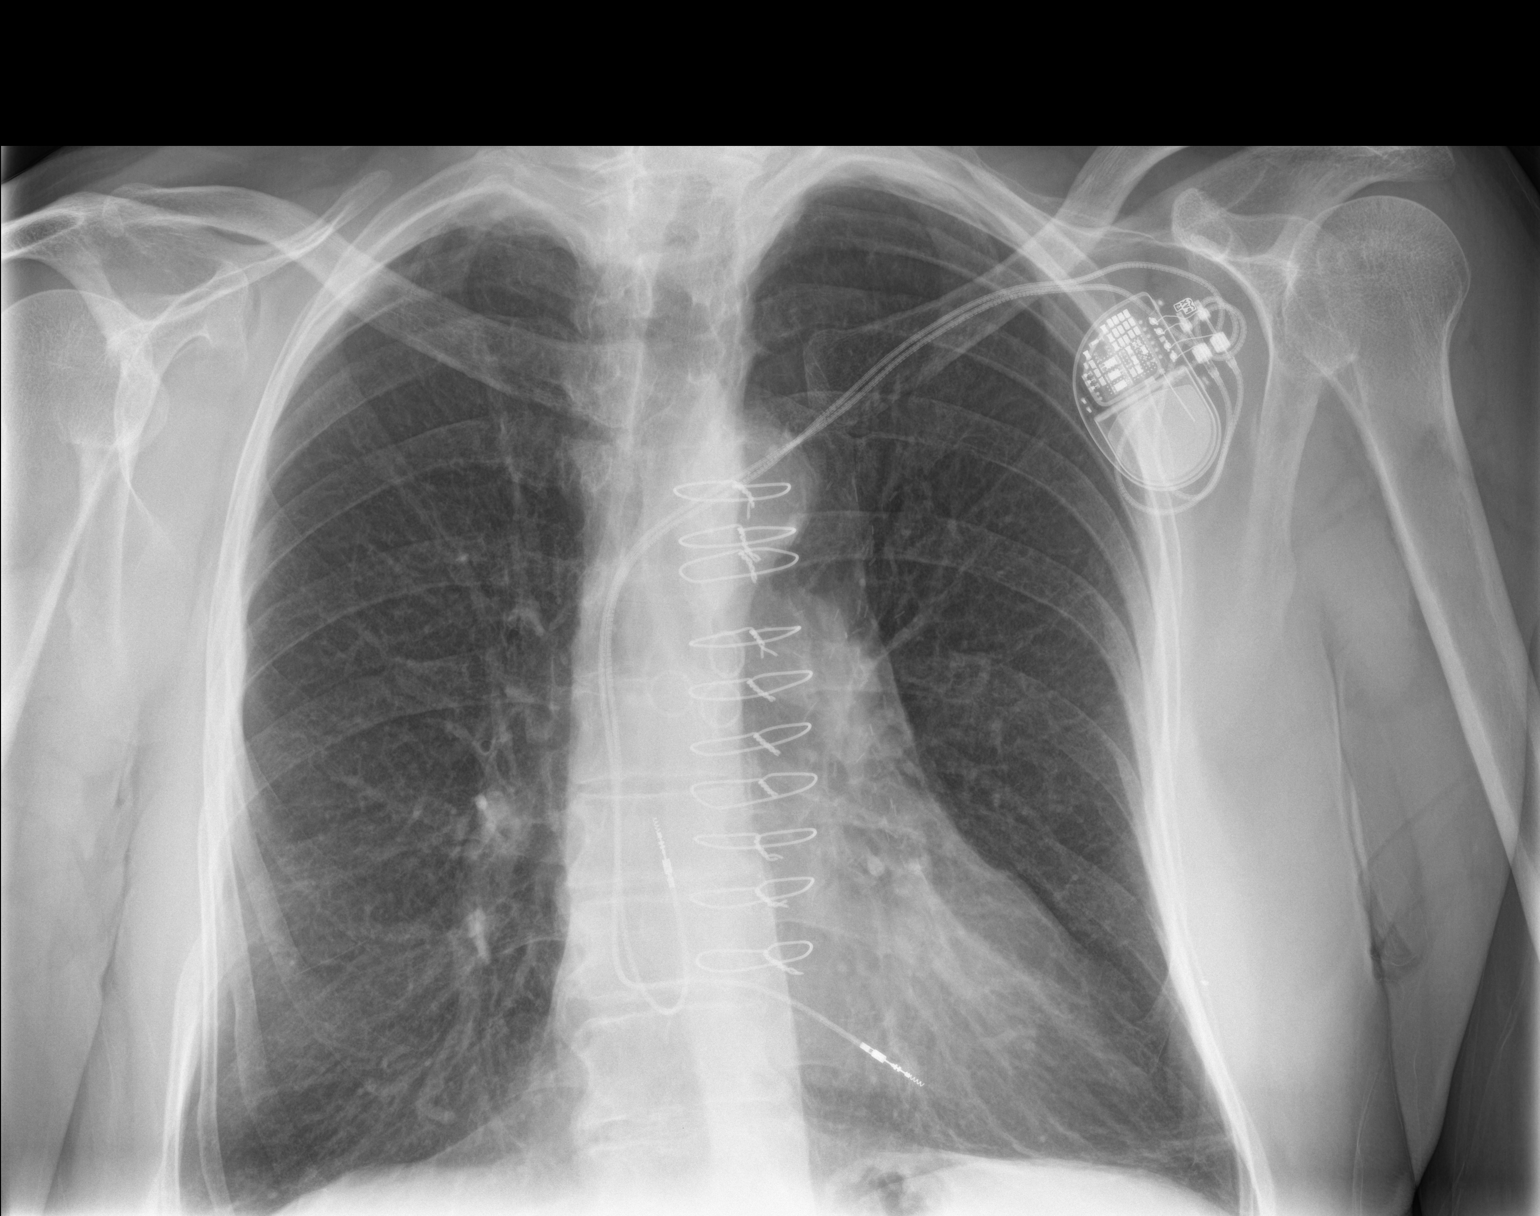

[chest lat]
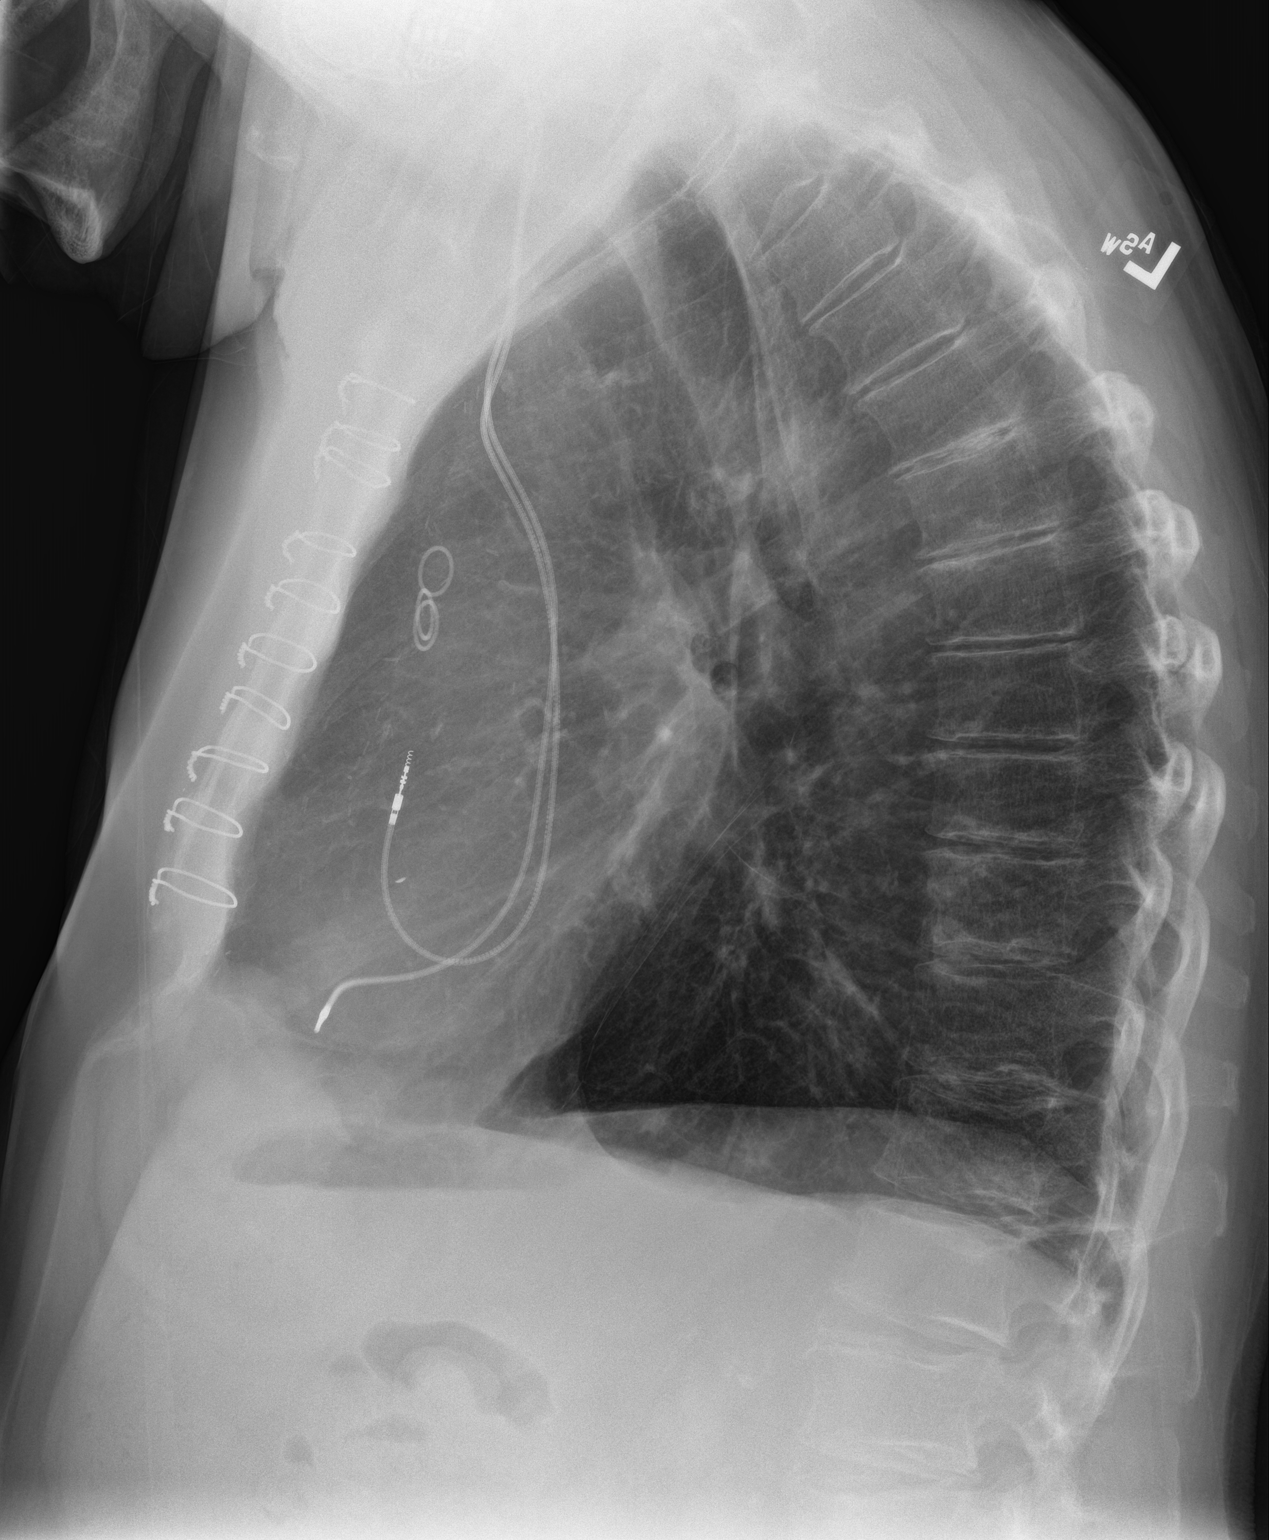

[chest pa (2 of 2)]
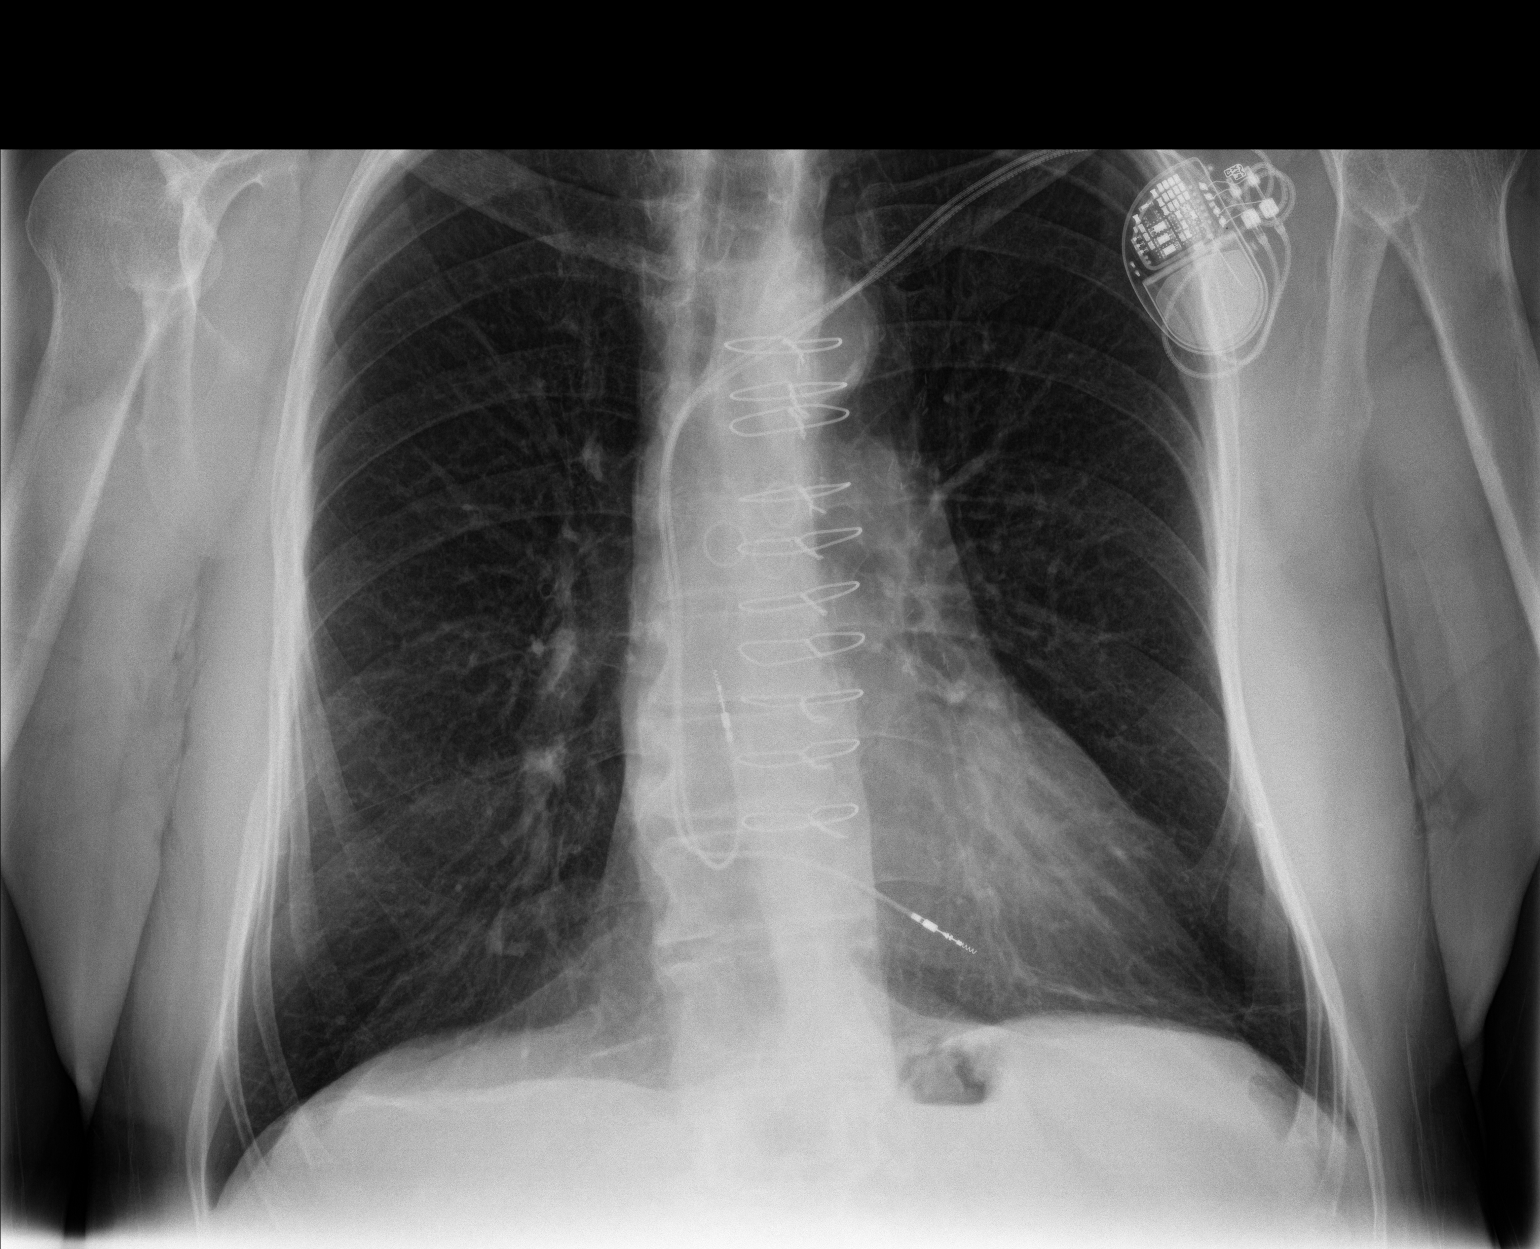

[3 of 3 positions shown; findings below may reference images not displayed]

FINDINGS: LEFT subclavian transvenous pacemaker leads project at RIGHT atrium
and RIGHT ventricle.

Normal heart size post CABG.

Mediastinal contours and pulmonary vascularity normal.

Atherosclerotic calcification aorta.

Minimal chronic atelectasis versus scarring at LEFT base.

Lungs otherwise clear.

No acute infiltrate, pleural effusion, or pneumothorax.

Bones demineralized.
IMPRESSION: Post CABG and pacemaker.

Minimal chronic atelectasis versus scarring at LEFT base.

## 2023-04-08 IMAGING — CT CT ABD-PELV W/O CM
2 of 4 series · 14 of 46 positions shown, 16 images · non-contrast
Comparison: Chest radiograph earlier today.

CLINICAL DATA: Weakness, low back pain, fever. Nausea and vomiting.

EXAM:
CT CHEST, ABDOMEN AND PELVIS WITHOUT CONTRAST
TECHNIQUE: Multidetector CT imaging of the chest, abdomen and pelvis was
performed following the standard protocol without IV contrast.

[Series 3: cap without · axial · non-contrast · 0.78mm/px · z∈[+794,+1379]mm · 11 of 137 slices shown, 13 images]
[im 10/137  soft-tissue]
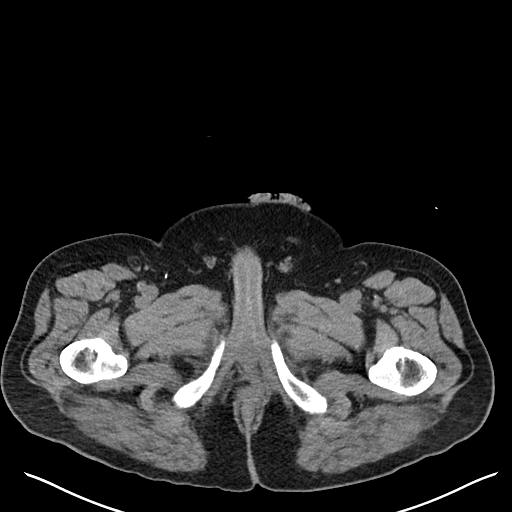
[im 10/137  bone]
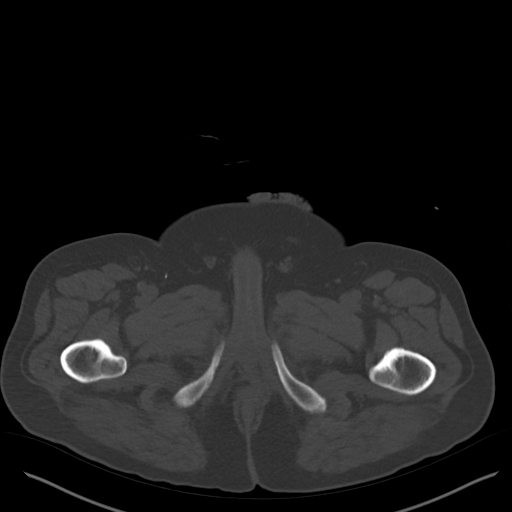
[im 20/137  soft-tissue]
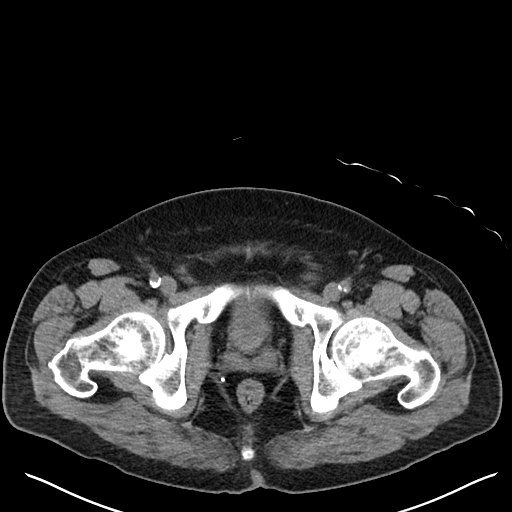
[im 30/137  soft-tissue]
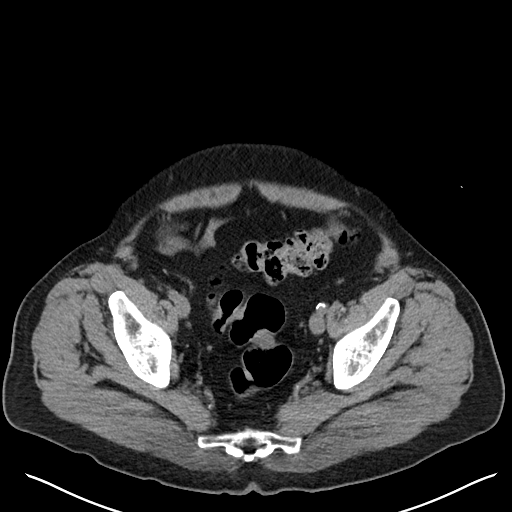
[im 49/137  soft-tissue]
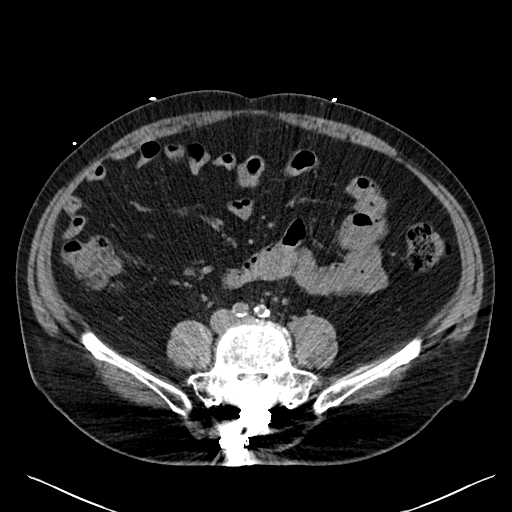
[im 59/137  soft-tissue]
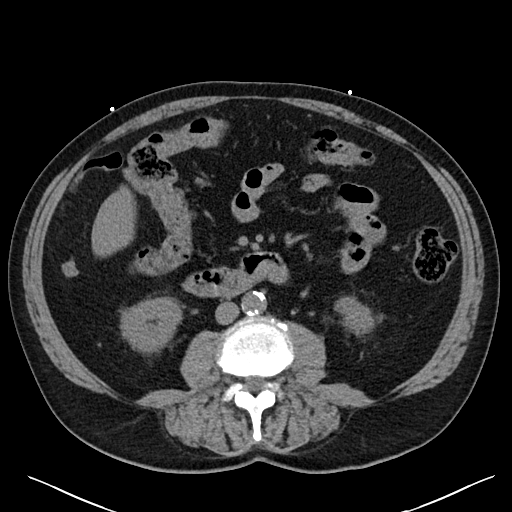
[im 69/137  soft-tissue]
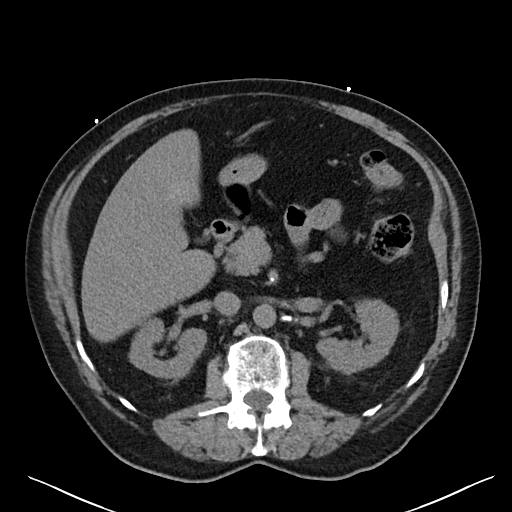
[im 78/137  soft-tissue]
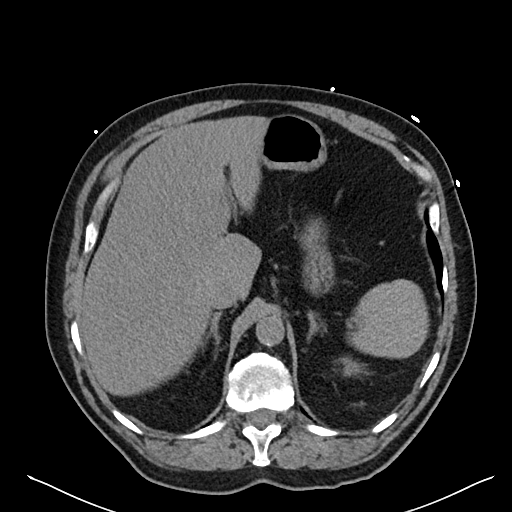
[im 88/137  soft-tissue]
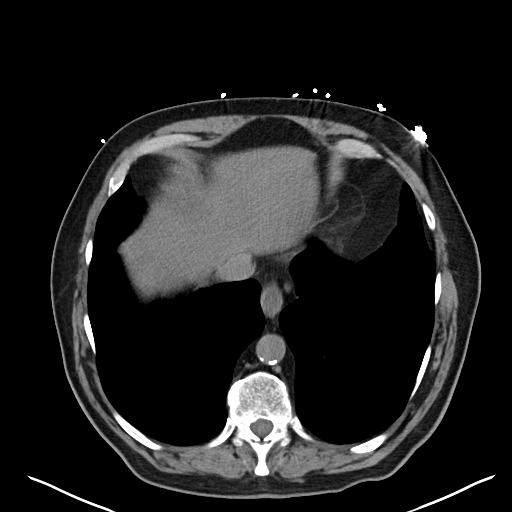
[im 107/137  soft-tissue]
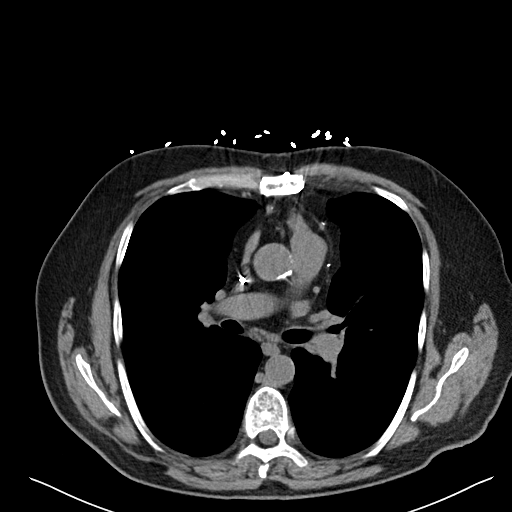
[im 107/137  bone]
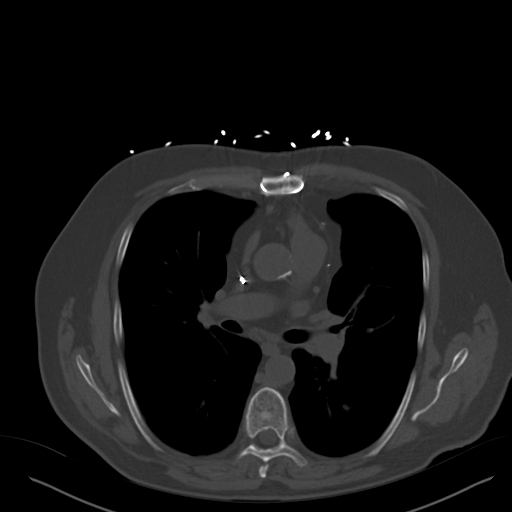
[im 117/137  soft-tissue]
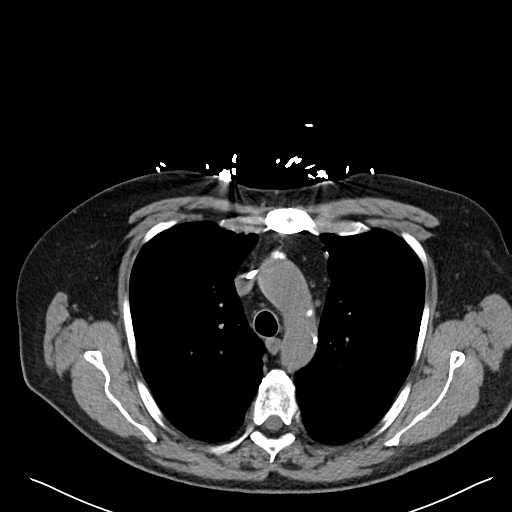
[im 127/137  soft-tissue]
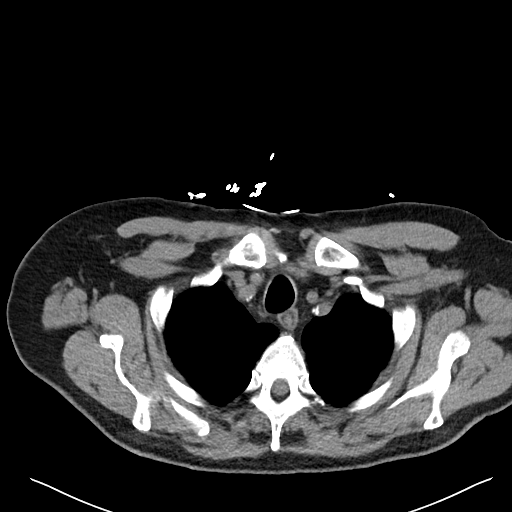

[Series 6: cor · coronal · 0.93mm/px · 3 of 111 slices shown]
[im 37/111  soft-tissue]
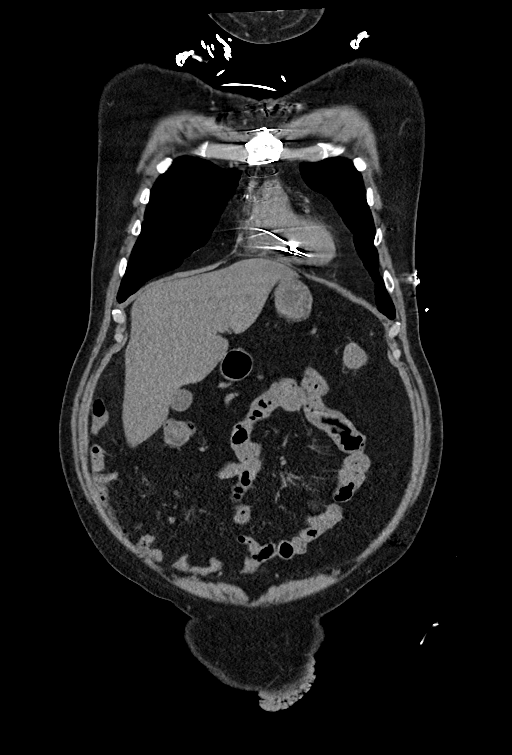
[im 49/111  soft-tissue]
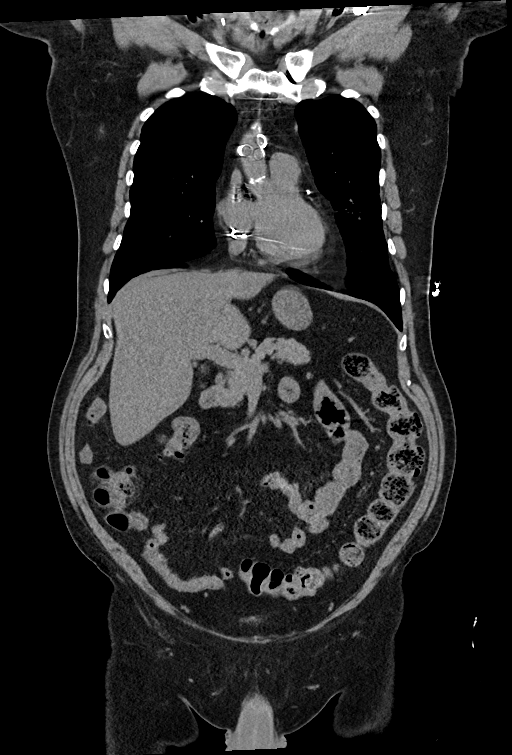
[im 62/111  soft-tissue]
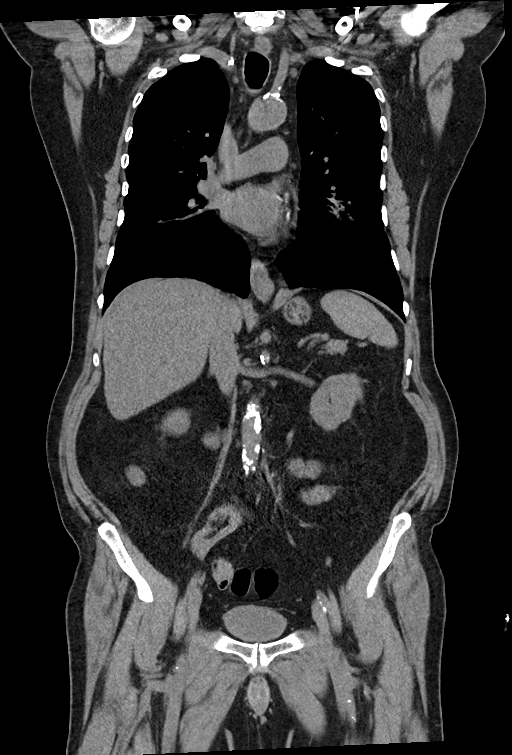

[14 of 46 positions shown; findings below may reference images not displayed]

FINDINGS: CT CHEST FINDINGS

Cardiovascular: Left-sided pacemaker in place leads in the right
atrium and ventricle. Heart is normal in size. Median sternotomy
with calcification of native coronary arteries. Aortic
atherosclerosis. No aortic aneurysm.

Mediastinum/Nodes: Small mediastinal lymph nodes are not enlarged by
size criteria. Suspected 11 mm left hilar node, not well assessed in
the absence of IV contrast. Small hiatal hernia. No thyroid nodule.

Lungs/Pleura: Left upper lobe and lingular ground-glass and
consolidative opacity consistent with pneumonia. Few additional
ground-glass opacities in the right upper lobe also likely represent
pneumonia. Mild to moderate apical predominant emphysema. Central
bronchial thickening. Linear lower lobe atelectasis. Calcified
granuloma in the right lower lobe. No noncalcified pulmonary
nodules. No pleural fluid.

Musculoskeletal: There are no acute or suspicious osseous
abnormalities. Median sternotomy

CT ABDOMEN PELVIS FINDINGS

Hepatobiliary: Decreased hepatic density consistent with steatosis.
No evidence of focal liver lesion. Small Phrygian cap in the
gallbladder with questionable layering sludge or stones. No abnormal
gallbladder distention or pericholecystic fat stranding. No biliary
dilatation.

Pancreas: No ductal dilatation or inflammation.

Spleen: Normal in size without focal abnormality.

Adrenals/Urinary Tract: No adrenal nodule. No hydronephrosis or
renal calculi. Mild symmetric perinephric edema typically chronic.
No evidence of focal renal abnormality on noncontrast exam.
Minimally distended urinary bladder. No bladder wall thickening.

Stomach/Bowel: Small hiatal hernia. Stomach otherwise unremarkable.
No small bowel obstruction or inflammation. Normal appendix. No
colonic inflammation or pericolonic edema.

Vascular/Lymphatic: Moderate aortic and branch atherosclerosis. No
aortic aneurysm. No enlarged lymph nodes in the abdomen or pelvis.

Reproductive: Prostate is unremarkable.

Other: No free air, free fluid, or intra-abdominal fluid collection.
Surgical clips in the right inguinal region. No abdominal wall
hernia.

Musculoskeletal: Postsurgical change at L5-S1. Presumed bone graft
from the left iliac crest. Multilevel lumbar spondylosis with
endplate spurring. There are no acute or suspicious osseous
abnormalities.
IMPRESSION: 1. Left upper lobe and lingular ground-glass and consolidative
opacity consistent with pneumonia. Few additional ground-glass
opacities in the right upper lobe also likely represent pneumonia.
Recommend radiographic follow-up after course of treatment to ensure
resolution.
2. No acute abnormality in the abdomen/pelvis.
3. Emphysema.  Moderate thoracoabdominal aortic atherosclerosis.
4. Hepatic steatosis.
5. Small hiatal hernia.

Aortic Atherosclerosis (0Y7YN-HSN.N) and Emphysema (0Y7YN-1ED.S).

## 2023-04-08 IMAGING — CR DG CHEST 2V
2 series · 2 of 2 positions shown · non-contrast
Comparison: April 26, 2020

CLINICAL DATA: Fever.

EXAM:
CHEST - 2 VIEW

[chest pa]
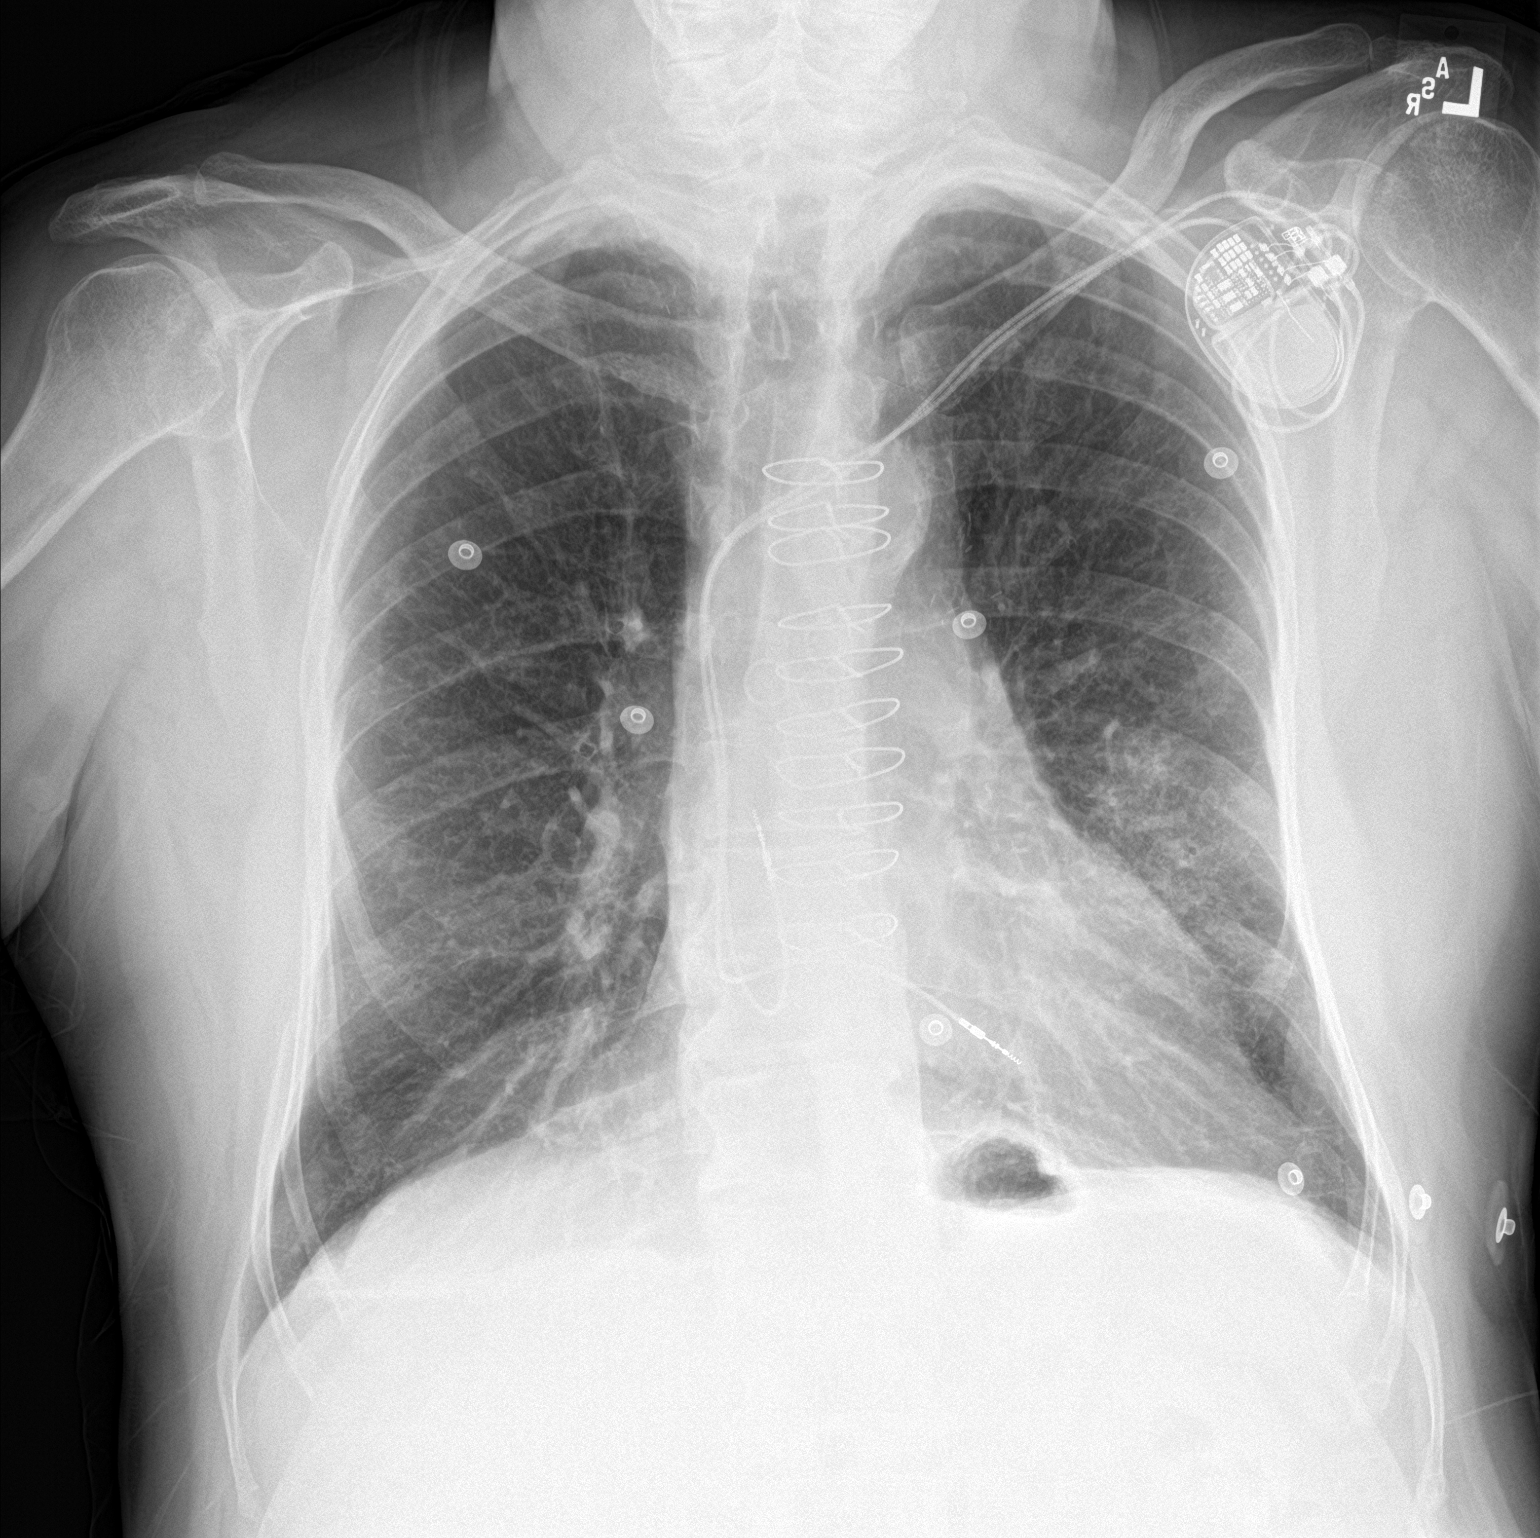

[chest lat]
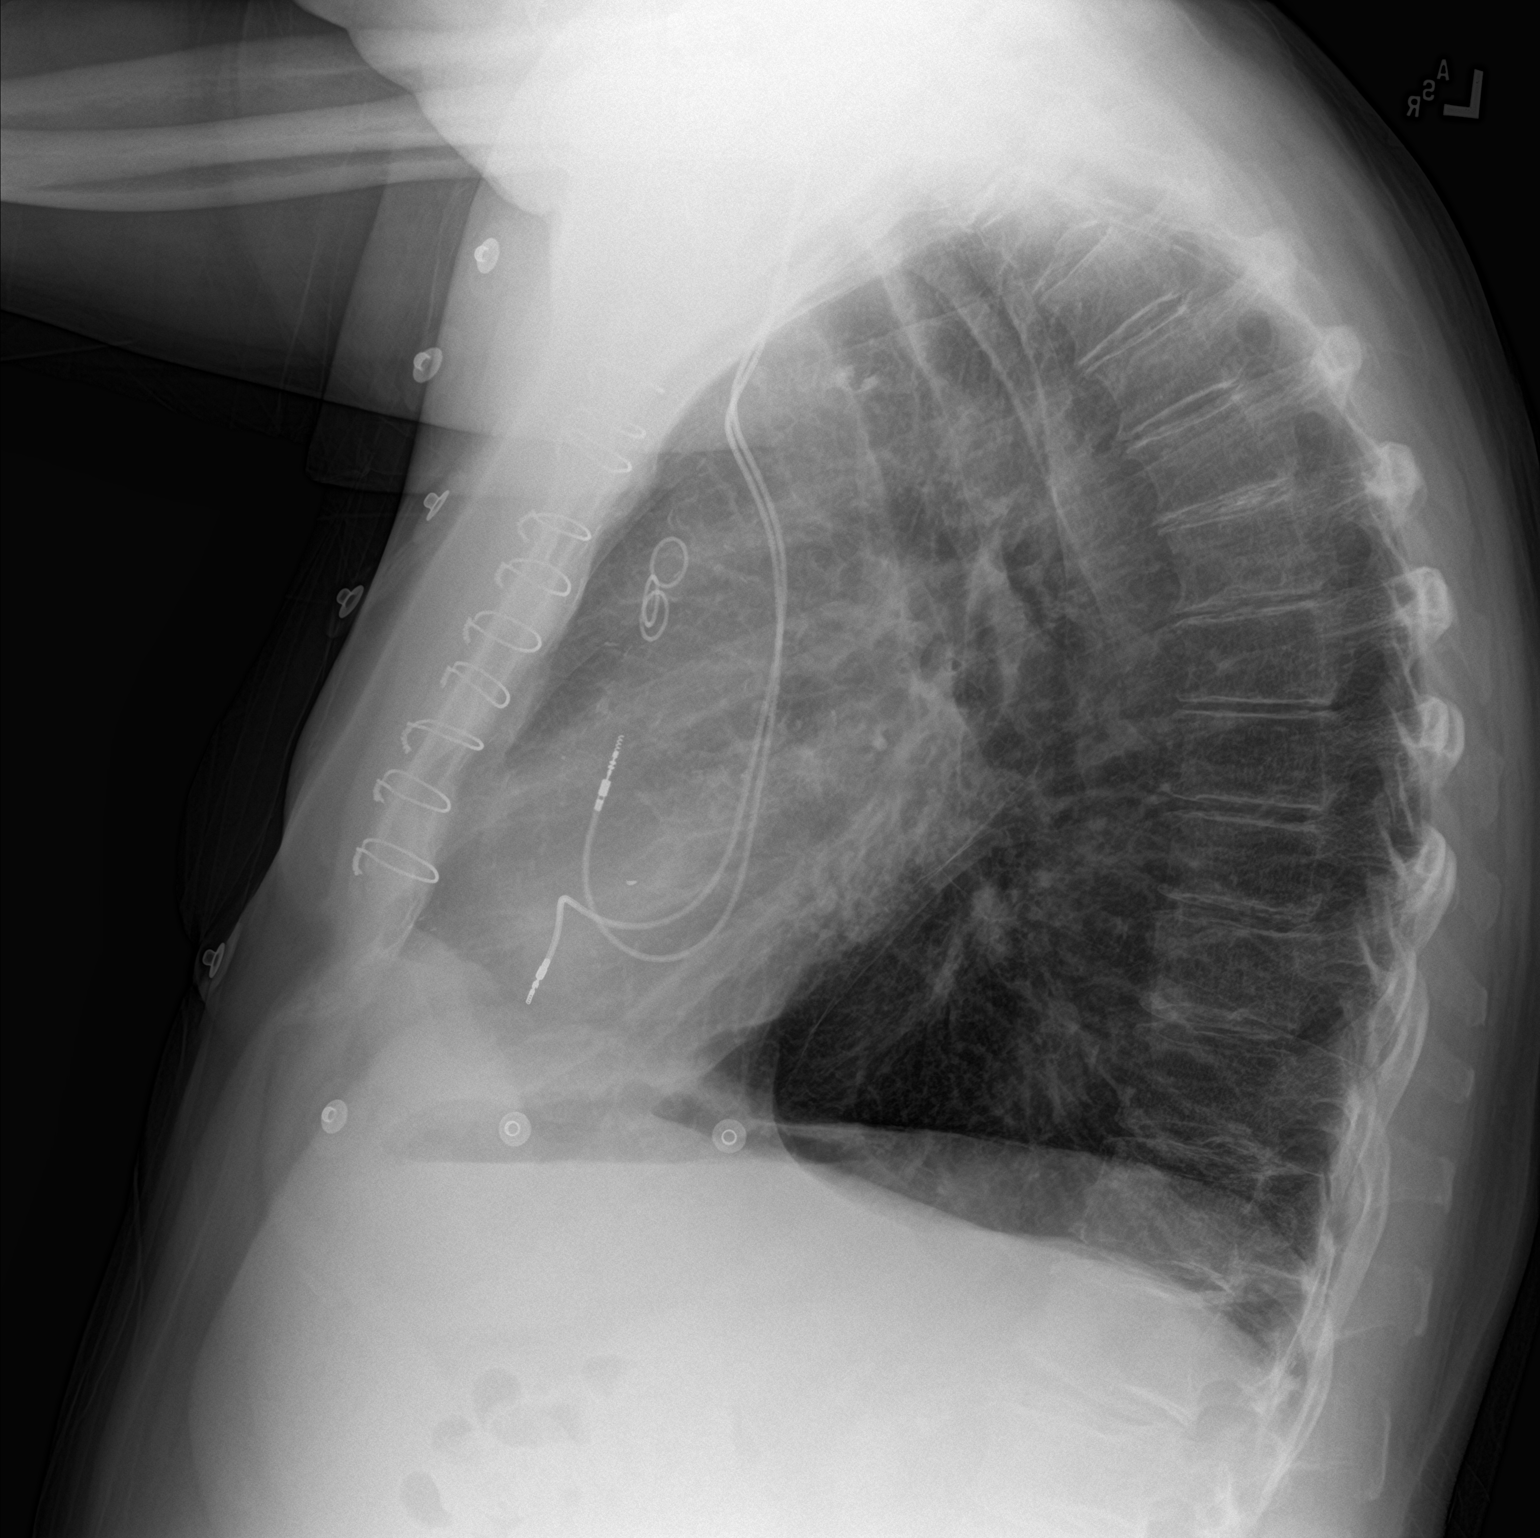

[2 of 2 positions shown; findings below may reference images not displayed]

FINDINGS: Stable pacemaker. Stable cardiomediastinal silhouette. No
pneumothorax. The right lung is clear. Infiltrates seen in the
lingula. Hyperinflation of the lungs with flattening of the
diaphragms. No other acute abnormalities.
IMPRESSION: 1. Findings worrisome for a lingular infiltrate/pneumonia.
2. Findings consistent with COPD or emphysema.

## 2023-05-08 ENCOUNTER — Other Ambulatory Visit: Payer: Self-pay

## 2023-05-08 DIAGNOSIS — I6523 Occlusion and stenosis of bilateral carotid arteries: Secondary | ICD-10-CM

## 2023-05-10 DIAGNOSIS — I517 Cardiomegaly: Secondary | ICD-10-CM | POA: Diagnosis not present

## 2023-05-15 ENCOUNTER — Ambulatory Visit: Payer: 59 | Admitting: Vascular Surgery

## 2023-05-15 ENCOUNTER — Ambulatory Visit (HOSPITAL_COMMUNITY): Payer: 59

## 2023-07-24 ENCOUNTER — Ambulatory Visit: Admitting: Vascular Surgery

## 2023-07-24 ENCOUNTER — Ambulatory Visit (HOSPITAL_COMMUNITY)

## 2023-08-20 ENCOUNTER — Other Ambulatory Visit: Payer: Self-pay

## 2023-08-20 DIAGNOSIS — I6523 Occlusion and stenosis of bilateral carotid arteries: Secondary | ICD-10-CM

## 2023-08-21 NOTE — Progress Notes (Signed)
 [facility] post TCM follow up ( 28 day follow up)  08/21/23 Day 28 HN called patient son, Steffan, who is listed on hipaa form, # [phone] and spoke with Steffan    Date of call: 08/21/23   Discharged from: [facility]   Updates/Changes since last encounter: HN called patient for 30 day hospital follow up from most recent stay for acute on chronic hypoxic respiratory failure, CHF, CKD, DM type 2, uncontrolled . HN spoke with patient son, Steffan, who is listed on HIPAA form.  He reports that patient is doing fine.  Steffan said patient is really doing really good. He said patient is using his oxygen like he is suppose to and has tried to make some changes to his diet.  No other issues or concerns at this time. HN notified Steffan to call PCP office if any new issues or concerns.   Current Questions/Concerns: No  Is patient candidate for Navigation: No   Jon Sharps, RN [facility] [phone]  Electronically signed by: Jon Earnie Sharps, RN 08/21/2023 11:17 AM

## 2023-09-04 ENCOUNTER — Ambulatory Visit (HOSPITAL_COMMUNITY)
Admission: RE | Admit: 2023-09-04 | Discharge: 2023-09-04 | Disposition: A | Discharge status: Home / Self Care | Source: Ambulatory Visit | Attending: Vascular Surgery | Admitting: Vascular Surgery

## 2023-09-04 ENCOUNTER — Encounter: Payer: Self-pay | Admitting: Vascular Surgery

## 2023-09-04 ENCOUNTER — Ambulatory Visit (INDEPENDENT_AMBULATORY_CARE_PROVIDER_SITE_OTHER): Admitting: Vascular Surgery

## 2023-09-04 VITALS — BP 145/73 | HR 67 | Temp 98.2°F | Ht 70.0 in | Wt 187.0 lb

## 2023-09-04 DIAGNOSIS — I6523 Occlusion and stenosis of bilateral carotid arteries: Secondary | ICD-10-CM | POA: Diagnosis present

## 2023-09-04 NOTE — Progress Notes (Signed)
 Patient ID: Dean Ellison, male   DOB: 03/09/1955, 68 y.o.   MRN: [id number]  Reason for Consult: Follow-up   Referred by Jackolyn Darice BROCKS, FNP  Subjective:     HPI:  Dean Ellison is a 68 y.o. male well-known to me from history of stenting of his right carotid for asymptomatic disease with subsequent repeat stenting for in-stent restenosis.  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  He denies any new stroke, TIA or amaurosis.  He does have numbness of his left outer upper arm which is intermittent in nature and mostly positional.  He is taking aspirin  and statin.  Past Medical History:  Diagnosis Date   Anemia    Arthritis    CAD (coronary artery disease)    Carotid artery occlusion    COPD (chronic obstructive pulmonary disease) (HCC)    Diabetes mellitus without complication (HCC)    Dyspnea    Gout    Hyperlipidemia    Hypertension    OSA (obstructive sleep apnea)    does not use CPAP   Persistent disorder of initiating or maintaining sleep    Presence of permanent cardiac pacemaker    Vitamin D  deficiency    Family History  Problem Relation Age of Onset   Heart disease Mother    Diabetes Mother    Hypertension Mother    Arthritis Mother    Heart disease Father    Diabetes Father    Hypertension Father    Arthritis Father    Breast cancer Sister 91       maternal half-sister   Skin cancer Brother        83s   Lung cancer Maternal Aunt        60+   Colon cancer Maternal Aunt        60+   Prostate cancer Maternal Uncle        60+   Past Surgical History:  Procedure Laterality Date   ABDOMINAL AORTOGRAM W/LOWER EXTREMITY N/A 11/16/2019   Procedure: ABDOMINAL AORTOGRAM W/LOWER EXTREMITY;  Surgeon: Sheree Penne Bruckner, MD;  Location: [facility] INVASIVE CV LAB;  Service: Cardiovascular;  Laterality: N/A;  Bilateral    BACK SURGERY  1991    CARDIAC CATHETERIZATION Right 2013   CATARACT EXTRACTION Bilateral 1994   CORONARY ARTERY BYPASS GRAFT  2013   PACEMAKER INSERTION     TRANSCAROTID ARTERY REVASCULARIZATION (TCAR) Right 10/22/2018   TRANSCAROTID ARTERY REVASCULARIZATION REDO (Right Neck)   TRANSCAROTID ARTERY REVASCULARIZATION  Right 07/18/2017   Procedure: TRANSCAROTID ARTERY REVASCULARIZATION;  Surgeon: Sheree Penne Bruckner, MD;  Location: [facility] OR;  Service: Vascular;  Laterality: Right;   TRANSCAROTID ARTERY REVASCULARIZATION  Right 10/22/2018   Procedure: TRANSCAROTID ARTERY REVASCULARIZATION REDO;  Surgeon: Serene Gaile ORN, MD;  Location: [facility] OR;  Service: Vascular;  Laterality: Right;    Short Social History:  Social History   Tobacco Use   Smoking status: Former    Current packs/day: 0.00    Average packs/day: 0.3 packs/day for 40.0 years (10.0 ttl pk-yrs)    Types: Cigarettes    Start date: 12/20/1980    Quit date: 12/20/2020    Years since quitting: 2.7   Smokeless tobacco: Never   Tobacco comments:    4 cigarettes a week  Substance Use Topics   Alcohol use: Yes    Alcohol/week: 1.0 standard drink of alcohol  Types: 1 Cans of beer per week    Comment: 1 every 6 months or more    Allergies  Allergen Reactions   Meperidine  Other (See Comments)    Demerol . Pt stated it made me crazy    Current Outpatient Medications  Medication Sig Dispense Refill   torsemide (DEMADEX) 20 MG tablet TAKE 2 TABLETS BY MOUTH ONCE DAILY TO REPLACE FUROSEMIDE      albuterol  (VENTOLIN  HFA) 108 (90 Base) MCG/ACT inhaler Inhale 1-2 puffs into the lungs every 6 (six) hours as needed for wheezing or shortness of breath. 1 g 0   amiodarone  (PACERONE ) 200 MG tablet Take 200 mg by mouth daily.     aspirin  EC 81 MG tablet Take 81 mg by mouth daily.     atenolol  (TENORMIN ) 50 MG tablet Take 50 mg by mouth 2 (two) times daily.      atorvastatin  (LIPITOR) 40 MG tablet Take 80 mg by mouth daily.     Blood Glucose Monitoring  Suppl (TRUE METRIX METER) w/Device KIT      clopidogrel  (PLAVIX ) 75 MG tablet Take 75 mg by mouth daily.     Evolocumab (REPATHA SURECLICK) 140 MG/ML SOAJ Inject into the skin.     famotidine  (PEPCID ) 20 MG tablet Take 20 mg by mouth 2 (two) times daily.     fluticasone  (FLONASE ) 50 MCG/ACT nasal spray Place 2 sprays into both nostrils daily. 1 g 0   furosemide  (LASIX ) 20 MG tablet Take 20 mg by mouth daily.      glipiZIDE  (GLUCOTROL ) 5 MG tablet Take 5 mg by mouth daily.     Icosapent Ethyl (VASCEPA) 1 g CAPS Take 2 g by mouth 2 (two) times daily.     insulin  aspart (NOVOLOG  FLEXPEN) 100 UNIT/ML FlexPen Inject under skin 3 times a day for mealtime and correction per MD instructions. Maximum of 20 units per day.     insulin  glargine (LANTUS) 100 UNIT/ML Solostar Pen Inject into the skin.     isosorbide mononitrate (IMDUR) 30 MG 24 hr tablet Take 30 mg by mouth daily.     JARDIANCE 10 MG TABS tablet Take 10 mg by mouth daily.     latanoprost  (XALATAN ) 0.005 % ophthalmic solution Place 1 drop into both eyes at bedtime.  5   levothyroxine (SYNTHROID) 100 MCG tablet Take 100 mcg by mouth daily before breakfast.     lidocaine  (XYLOCAINE ) 2 % solution Patient: Mix 1part 2% viscous lidocaine , 1part H20. Swish & swallow 10mL of diluted mixture, 30min before meals and at bedtime, up to QID 200 mL 3   loratadine -pseudoephedrine (CLARITIN -D 12 HOUR) 5-120 MG tablet Take 1 tablet by mouth 2 (two) times daily. 14 tablet 0   MOUNJARO 7.5 MG/0.5ML Pen Inject 7.5 mg into the skin once a week.     nitroGLYCERIN  (NITROSTAT ) 0.4 MG SL tablet Place 0.4 mg under the tongue every 5 (five) minutes as needed for chest pain.     omeprazole (PRILOSEC) 40 MG capsule Take 40 mg by mouth daily.     sitaGLIPtin (JANUVIA) 100 MG tablet Take 100 mg by mouth daily.     tiotropium (SPIRIVA ) 18 MCG inhalation capsule Place 18 mcg into inhaler and inhale daily.      TRESIBA FLEXTOUCH 200 UNIT/ML FlexTouch Pen Inject into the skin.      TRUE METRIX BLOOD GLUCOSE TEST test strip      TRUEplus Lancets 33G MISC      varenicline  (CHANTIX ) 1 MG tablet Take 1  mg by mouth 2 (two) times daily.     XARELTO  2.5 MG TABS tablet Take 2.5 mg by mouth 2 (two) times daily.      No current facility-administered medications for this visit.    Review of Systems  Constitutional:  Constitutional negative. HENT: HENT negative.  Eyes: Eyes negative.  Respiratory: Respiratory negative.  Cardiovascular: Cardiovascular negative.  GI: Gastrointestinal negative.  Musculoskeletal: Musculoskeletal negative.  Skin: Skin negative.  Neurological: Positive for numbness.  Hematologic: Hematologic/lymphatic negative.  Psychiatric: Psychiatric negative.        Objective:  Objective   Vitals:   09/04/23 1359 09/04/23 1401  BP: (!) 152/75 (!) 145/73  Pulse: 67   Temp: 98.2 F (36.8 C)   SpO2: 90%   Weight: 187 lb (84.8 kg)   Height: 5' 10 (1.778 m)    Body mass index is 26.83 kg/m.  Physical Exam HENT:     Nose: Nose normal.  Eyes:     Pupils: Pupils are equal, round, and reactive to light.  Neck:     Vascular: Carotid bruit present.     Comments: Voice prosthesis Cardiovascular:     Rate and Rhythm: Normal rate.  Skin:    General: Skin is warm.     Capillary Refill: Capillary refill takes less than 2 seconds.  Neurological:     Mental Status: He is alert.  Psychiatric:        Mood and Affect: Mood normal.        Thought Content: Thought content normal.        Judgment: Judgment normal.     Data: Right Carotid Findings:  +----------+--------+--------+--------+------------------+-----------------  -+           PSV cm/sEDV cm/sStenosisPlaque DescriptionComments             +----------+--------+--------+--------+------------------+-----------------  -+  CCA Prox  138     22                                intimal  thickening  +----------+--------+--------+--------+------------------+-----------------   -+  CCA Distal74      13              focal and calcificintimal  thickening  +----------+--------+--------+--------+------------------+-----------------  -+  ECA      342     47      >50%    focal and smooth                       +----------+--------+--------+--------+------------------+-----------------  -+   +----------+--------+-------+---------+-------------------+           PSV cm/sEDV cmsDescribe Arm Pressure (mmHG)  +----------+--------+-------+---------+-------------------+  [id number]    0      Turbulent140                  +----------+--------+-------+---------+-------------------+   +---------+--------+--------+---------+  VertebralPSV cm/sEDV cm/sAntegrade  +---------+--------+--------+---------+      Right Stent(s):  +---------------+-------+--------+--------------+----------+---------------  ----+  ICA           PSV    EDV cm/sStenosis      Waveform  Comments                             cm/s                                                         +---------------+-------+--------+--------------+----------+---------------  ----+  Prox to Stent  92     15                    monophasic                      +---------------+-------+--------+--------------+----------+---------------  ----+  Proximal Stent 254    34      50-75%        monophasicturbulent flow                                      stenosis                noted                 +---------------+-------+--------+--------------+----------+---------------  ----+  Mid Stent      149    38                    monophasic                      +---------------+-------+--------+--------------+----------+---------------  ----+  Distal Stent   140    35                    monophasic                      +---------------+-------+--------+--------------+----------+---------------  ----+  Distal to Stent119    25                     monophasic                      +---------------+-------+--------+--------------+----------+---------------  ----+          Left Carotid Findings:  +----------+--------+--------+--------+---------------------+--------------  ----+           PSV cm/sEDV cm/sStenosisPlaque Description   Comments             +----------+--------+--------+--------+---------------------+--------------  ----+  CCA Prox  79      7               homogeneous and      intimal  thickening                                   smooth                                    +----------+--------+--------+--------+---------------------+--------------  ----+  CCA Mid                           homogeneous and      intimal  thickening                                   smooth                                    +----------+--------+--------+--------+---------------------+--------------  ----+  CCA Distal85      9  diffuse, homogeneous,                                                       calcific and smooth                       +----------+--------+--------+--------+---------------------+--------------  ----+  ICA Prox  140     29              diffuse and calcific                      +----------+--------+--------+--------+---------------------+--------------  ----+  ICA Mid   251     45      40-59%  diffuse and calcific                      +----------+--------+--------+--------+---------------------+--------------  ----+  ICA Distal137     29                                                        +----------+--------+--------+--------+---------------------+--------------  ----+  ECA      91      0                                                         +----------+--------+--------+--------+---------------------+--------------  ----+   +----------+--------+--------+---------+-------------------+            PSV cm/sEDV cm/sDescribe Arm Pressure (mmHG)  +----------+--------+--------+---------+-------------------+  [id number]    22      Turbulent140                  +----------+--------+--------+---------+-------------------+   +---------+--------+---+--------+--+---------+  VertebralPSV cm/s101EDV cm/s18Antegrade  +---------+--------+---+--------+--+---------+         Summary:  Right Carotid: The ECA appears >50% stenosed. 50-75% stenosis within  proximal                ICA stent.   Left Carotid: Velocities in the left ICA are consistent with a 40-59%  stenosis.   Vertebrals: Bilateral vertebral arteries demonstrate antegrade flow.  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   *See table(s) above for measurements and observations.         Assessment/Plan:     68 year old male with history of transcarotid artery stenting on 2 separate occasions both of which were asymptomatic.  He remains maximally medically managed.  There is evidence of recurrent stenosis of the right ICA stent possibly secondary to recent radiation.  Given his previous procedures and the fact that he remains asymptomatic we discussed following up and shorter course in 6 months.  We did discuss his signs and symptoms of stroke and he demonstrates good understanding.  Sure that I will see him back in 6 months we may ultimately need to obtain CT angio for further evaluation although with chronic kidney disease this may be prohibitive.     Penne Lonni Colorado MD [facility]

## 2023-09-05 ENCOUNTER — Other Ambulatory Visit: Payer: Self-pay | Admitting: *Deleted

## 2023-09-05 DIAGNOSIS — I6523 Occlusion and stenosis of bilateral carotid arteries: Secondary | ICD-10-CM

## 2024-01-22 ENCOUNTER — Other Ambulatory Visit: Payer: Self-pay

## 2024-01-22 DIAGNOSIS — I6523 Occlusion and stenosis of bilateral carotid arteries: Secondary | ICD-10-CM

## 2024-03-04 ENCOUNTER — Ambulatory Visit: Admitting: Vascular Surgery

## 2024-03-04 ENCOUNTER — Ambulatory Visit (HOSPITAL_COMMUNITY)
Admission: RE | Admit: 2024-03-04 | Discharge: 2024-03-04 | Disposition: A | Discharge status: Home / Self Care | Source: Ambulatory Visit | Attending: Vascular Surgery | Admitting: Vascular Surgery

## 2024-03-04 ENCOUNTER — Encounter: Payer: Self-pay | Admitting: Vascular Surgery

## 2024-03-04 VITALS — BP 135/72 | HR 85 | Temp 98.3°F | Ht 70.0 in | Wt 172.0 lb

## 2024-03-04 DIAGNOSIS — I70213 Atherosclerosis of native arteries of extremities with intermittent claudication, bilateral legs: Secondary | ICD-10-CM

## 2024-03-04 DIAGNOSIS — I6523 Occlusion and stenosis of bilateral carotid arteries: Secondary | ICD-10-CM | POA: Diagnosis present

## 2024-03-04 NOTE — Progress Notes (Signed)
 "  Patient ID: Dean Ellison, male   DOB: 1956-01-10, 69 y.o.   MRN: [id number]  Reason for Consult: Follow-up   Referred by Jackolyn Darice BROCKS, FNP  Subjective:     HPI:  Dean Ellison is a 69 y.o. male has a history of asymptomatic right carotid artery disease underwent initial TCAR followed by subsequent repeat stenting for in-stent restenosis.  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  He is here today for follow-up carotid stenosis.  He continues to deny a history of stroke, TIA or amaurosis.  He currently takes aspirin  and a statin and is also on Xarelto .  Past Medical History:  Diagnosis Date   Anemia    Arthritis    CAD (coronary artery disease)    Carotid artery occlusion    COPD (chronic obstructive pulmonary disease) (HCC)    Diabetes mellitus without complication (HCC)    Dyspnea    Gout    Hyperlipidemia    Hypertension    OSA (obstructive sleep apnea)    does not use CPAP   Persistent disorder of initiating or maintaining sleep    Presence of permanent cardiac pacemaker    Vitamin D  deficiency    Family History  Problem Relation Age of Onset   Heart disease Mother    Diabetes Mother    Hypertension Mother    Arthritis Mother    Heart disease Father    Diabetes Father    Hypertension Father    Arthritis Father    Breast cancer Sister 50       maternal half-sister   Skin cancer Brother        12s   Lung cancer Maternal Aunt        60+   Colon cancer Maternal Aunt        60+   Prostate cancer Maternal Uncle        60+   Past Surgical History:  Procedure Laterality Date   ABDOMINAL AORTOGRAM W/LOWER EXTREMITY N/A 11/16/2019   Procedure: ABDOMINAL AORTOGRAM W/LOWER EXTREMITY;  Surgeon: Sheree Penne Bruckner, MD;  Location: [facility] INVASIVE CV LAB;  Service: Cardiovascular;  Laterality: N/A;  Bilateral    BACK SURGERY  1991   CARDIAC CATHETERIZATION  Right 2013   CATARACT EXTRACTION Bilateral 1994   CORONARY ARTERY BYPASS GRAFT  2013   PACEMAKER INSERTION     TRANSCAROTID ARTERY REVASCULARIZATION (TCAR) Right 10/22/2018   TRANSCAROTID ARTERY REVASCULARIZATION REDO (Right Neck)   TRANSCAROTID ARTERY REVASCULARIZATION  Right 07/18/2017   Procedure: TRANSCAROTID ARTERY REVASCULARIZATION;  Surgeon: Sheree Penne Bruckner, MD;  Location: [facility] OR;  Service: Vascular;  Laterality: Right;   TRANSCAROTID ARTERY REVASCULARIZATION  Right 10/22/2018   Procedure: TRANSCAROTID ARTERY REVASCULARIZATION REDO;  Surgeon: Serene Gaile ORN, MD;  Location: [facility] OR;  Service: Vascular;  Laterality: Right;    Short Social History:  Social History   Tobacco Use   Smoking status: Former    Current packs/day: 0.00    Average packs/day: 0.3 packs/day for 40.0 years (10.0 ttl pk-yrs)    Types: Cigarettes    Start date: 12/20/1980    Quit date: 12/20/2020    Years since quitting: 3.2   Smokeless tobacco: Never   Tobacco comments:    4 cigarettes a week  Substance Use Topics   Alcohol use: Yes    Alcohol/week: 1.0 standard drink of alcohol    Types: 1  Cans of beer per week    Comment: 1 every 6 months or more    Allergies[1]  Current Outpatient Medications  Medication Sig Dispense Refill   albuterol  (VENTOLIN  HFA) 108 (90 Base) MCG/ACT inhaler Inhale 1-2 puffs into the lungs every 6 (six) hours as needed for wheezing or shortness of breath. 1 g 0   amiodarone  (PACERONE ) 200 MG tablet Take 200 mg by mouth daily.     aspirin  EC 81 MG tablet Take 81 mg by mouth daily.     atenolol  (TENORMIN ) 50 MG tablet Take 50 mg by mouth 2 (two) times daily.      atorvastatin  (LIPITOR) 40 MG tablet Take 80 mg by mouth daily.     Blood Glucose Monitoring Suppl (TRUE METRIX METER) w/Device KIT      clopidogrel  (PLAVIX ) 75 MG tablet Take 75 mg by mouth daily.     Evolocumab (REPATHA SURECLICK) 140 MG/ML SOAJ Inject into the skin.     famotidine  (PEPCID ) 20 MG tablet  Take 20 mg by mouth 2 (two) times daily.     fluticasone  (FLONASE ) 50 MCG/ACT nasal spray Place 2 sprays into both nostrils daily. 1 g 0   furosemide  (LASIX ) 20 MG tablet Take 20 mg by mouth daily.      glipiZIDE  (GLUCOTROL ) 5 MG tablet Take 5 mg by mouth daily.     Icosapent Ethyl (VASCEPA) 1 g CAPS Take 2 g by mouth 2 (two) times daily.     insulin  aspart (NOVOLOG  FLEXPEN) 100 UNIT/ML FlexPen Inject under skin 3 times a day for mealtime and correction per MD instructions. Maximum of 20 units per day.     insulin  glargine (LANTUS) 100 UNIT/ML Solostar Pen Inject into the skin.     isosorbide mononitrate (IMDUR) 30 MG 24 hr tablet Take 30 mg by mouth daily.     JARDIANCE 10 MG TABS tablet Take 10 mg by mouth daily.     latanoprost  (XALATAN ) 0.005 % ophthalmic solution Place 1 drop into both eyes at bedtime.  5   levothyroxine (SYNTHROID) 100 MCG tablet Take 100 mcg by mouth daily before breakfast.     lidocaine  (XYLOCAINE ) 2 % solution Patient: Mix 1part 2% viscous lidocaine , 1part H20. Swish & swallow 10mL of diluted mixture, 30min before meals and at bedtime, up to QID 200 mL 3   loratadine -pseudoephedrine (CLARITIN -D 12 HOUR) 5-120 MG tablet Take 1 tablet by mouth 2 (two) times daily. 14 tablet 0   MOUNJARO 7.5 MG/0.5ML Pen Inject 7.5 mg into the skin once a week.     nitroGLYCERIN  (NITROSTAT ) 0.4 MG SL tablet Place 0.4 mg under the tongue every 5 (five) minutes as needed for chest pain.     omeprazole (PRILOSEC) 40 MG capsule Take 40 mg by mouth daily.     sitaGLIPtin (JANUVIA) 100 MG tablet Take 100 mg by mouth daily.     tiotropium (SPIRIVA ) 18 MCG inhalation capsule Place 18 mcg into inhaler and inhale daily.      torsemide (DEMADEX) 20 MG tablet TAKE 2 TABLETS BY MOUTH ONCE DAILY TO REPLACE FUROSEMIDE      TRESIBA FLEXTOUCH 200 UNIT/ML FlexTouch Pen Inject into the skin.     TRUE METRIX BLOOD GLUCOSE TEST test strip      TRUEplus Lancets 33G MISC      varenicline  (CHANTIX ) 1 MG tablet  Take 1 mg by mouth 2 (two) times daily.     XARELTO  2.5 MG TABS tablet Take 2.5 mg by mouth 2 (two)  times daily.      No current facility-administered medications for this visit.    Review of Systems  Constitutional:  Constitutional negative. HENT: HENT negative.  Eyes: Eyes negative.  Respiratory: Respiratory negative.  Cardiovascular: Positive for claudication.  GI: Gastrointestinal negative.  Musculoskeletal: Musculoskeletal negative.  Skin: Skin negative.  Neurological: Neurological negative. Hematologic: Hematologic/lymphatic negative.  Psychiatric: Psychiatric negative.        Objective:  Objective   Vitals:   03/04/24 1216 03/04/24 1218  BP: (!) 146/78 135/72  Pulse: 85   Temp: 98.3 F (36.8 C)      Physical Exam HENT:     Head: Normocephalic.     Nose: Nose normal.  Eyes:     Pupils: Pupils are equal, round, and reactive to light.  Neck:     Vascular: Carotid bruit present.  Cardiovascular:     Rate and Rhythm: Normal rate.     Heart sounds: Murmur heard.  Musculoskeletal:        General: Normal range of motion.     Right lower leg: No edema.     Left lower leg: No edema.  Skin:    General: Skin is warm.  Neurological:     General: No focal deficit present.     Mental Status: He is alert.     Data: Right Carotid Findings:  +----------+-------+--------+--------+--------------------------------+----  ----+           PSV    EDV cm/sStenosisPlaque Description               Comments           cm/s                                                              +----------+-------+--------+--------+--------------------------------+----  ----+  CCA Prox  106    18              homogeneous and smooth                     +----------+-------+--------+--------+--------------------------------+----  ----+  CCA Mid   184    34      <50%    homogeneous and smooth                      +----------+-------+--------+--------+--------------------------------+----  ----+  CCA Distal                       diffuse, heterogenous and                                                   calcific                                   +----------+-------+--------+--------+--------------------------------+----  ----+  ECA      232    2       >50%    diffuse and calcific                       +----------+-------+--------+--------+--------------------------------+----  ----+   +----------+--------+-------+----------------+-------------------+  PSV cm/sEDV cmsDescribe        Arm Pressure (mmHG)  +----------+--------+-------+----------------+-------------------+  Subclavian250           Multiphasic, [id number]                  +----------+--------+-------+----------------+-------------------+   +---------+--------+--+--------+-+---------+  VertebralPSV cm/s31EDV cm/s9Antegrade  +---------+--------+--+--------+-+---------+     Right Stent(s):  +---------------------+--------+--------+---------------+----------+-------  -+  DISTAL CCA-DISTAL ICAPSV cm/sEDV cm/sStenosis       Waveform   Comments  +---------------------+--------+--------+---------------+----------+-------  -+  Prox to Stent        75      13                     monophasic           +---------------------+--------+--------+---------------+----------+-------  -+  Proximal Stent       388     56      50-75% stenosismonophasic           +---------------------+--------+--------+---------------+----------+-------  -+  Mid Stent            115     29                     monophasic           +---------------------+--------+--------+---------------+----------+-------  -+  Distal Stent         130     27                     monophasic           +---------------------+--------+--------+---------------+----------+-------  -+  Distal to Stent       112     29                     monophasic           +---------------------+--------+--------+---------------+----------+-------  -+   Increased velocities in proximal stent.       Left Carotid Findings:  +----------+-------+--------+--------+--------------------------------+----  ----+           PSV    EDV cm/sStenosisPlaque Description               Comments           cm/s                                                              +----------+-------+--------+--------+--------------------------------+----  ----+  CCA Prox  74     13              diffuse, heterogenous and                                                   calcific                                   +----------+-------+--------+--------+--------------------------------+----  ----+  CCA Mid   168    24      <50%    diffuse, heterogenous and  calcific                                   +----------+-------+--------+--------+--------------------------------+----  ----+  CCA Distal81     16                                                         +----------+-------+--------+--------+--------------------------------+----  ----+  ICA Prox  139    23              diffuse, heterogenous and                                                   calcific                                   +----------+-------+--------+--------+--------------------------------+----  ----+  ICA Mid   239    47      40-59%  diffuse, heterogenous and                                                   calcific                                   +----------+-------+--------+--------+--------------------------------+----  ----+  ICA Distal139    27                                                         +----------+-------+--------+--------+--------------------------------+----  ----+  ECA      123     0               diffuse, calcific and                                                       heterogenous                               +----------+-------+--------+--------+--------------------------------+----  ----+   +----------+--------+--------+----------------+-------------------+           PSV cm/sEDV cm/sDescribe        Arm Pressure (mmHG)  +----------+--------+--------+----------------+-------------------+  [id number]            Multiphasic, [id number]                  +----------+--------+--------+----------------+-------------------+   +---------+--------+---+--------+--+---------+  VertebralPSV cm/s183EDV cm/s26Antegrade  +---------+--------+---+--------+--+---------+         Summary:  Right Carotid: Non-hemodynamically significant plaque <50% noted in the  CCA. The                 ECA appears >50% stenosed. 50-75% proximal stent stenosis,                 velocities have increased since prior exam.   Left Carotid: Velocities in the left ICA are consistent with a 40-59%  stenosis.               Non-hemodynamically significant plaque <50% noted in the  CCA.   Vertebrals: Bilateral vertebral arteries demonstrate antegrade flow.  Subclavians: Normal flow hemodynamics were seen in bilateral subclavian               arteries.      Assessment/Plan:    69 year old male with history as above now with increased velocities in the right sided carotid stents with concern for in-stent restenosis particularly now that he is status post radiation.  He continues on aspirin , Xarelto  and a statin.  I will have him follow-up in 2 to 3 months with CT angio of his head and neck and we will also repeat his ABIs at that time as it was been 2 years since last check.      Penne Lonni [place] MD [facility]      [1]  Allergies Allergen Reactions   Meperidine  Other (See Comments)    Demerol . Pt stated it made me  crazy   "

## 2024-03-05 ENCOUNTER — Other Ambulatory Visit: Payer: Self-pay | Admitting: *Deleted

## 2024-03-05 DIAGNOSIS — I739 Peripheral vascular disease, unspecified: Secondary | ICD-10-CM

## 2024-03-05 DIAGNOSIS — I70213 Atherosclerosis of native arteries of extremities with intermittent claudication, bilateral legs: Secondary | ICD-10-CM

## 2024-03-27 ENCOUNTER — Other Ambulatory Visit: Payer: Self-pay | Admitting: *Deleted

## 2024-03-27 DIAGNOSIS — I6523 Occlusion and stenosis of bilateral carotid arteries: Secondary | ICD-10-CM

## 2024-03-27 NOTE — Addendum Note (Signed)
 Addended by: TRUDY ELVIE SAILOR on: 03/27/2024 02:38 PM   Modules accepted: Orders

## 2024-04-16 ENCOUNTER — Ambulatory Visit (HOSPITAL_BASED_OUTPATIENT_CLINIC_OR_DEPARTMENT_OTHER)

## 2024-04-29 ENCOUNTER — Ambulatory Visit: Admitting: Vascular Surgery

## 2024-04-29 ENCOUNTER — Ambulatory Visit (HOSPITAL_COMMUNITY)
